# Patient Record
Sex: Female | Born: 1940 | Race: Black or African American | Hispanic: No | Smoking: Never smoker
Health system: Southern US, Community
[De-identification: ages and names within clinical notes are randomized; demographics above are authoritative.]

## PROBLEM LIST (undated history)

## (undated) DIAGNOSIS — I9789 Other postprocedural complications and disorders of the circulatory system, not elsewhere classified: Secondary | ICD-10-CM

## (undated) DIAGNOSIS — E119 Type 2 diabetes mellitus without complications: Secondary | ICD-10-CM

## (undated) DIAGNOSIS — N183 Chronic kidney disease, stage 3 unspecified: Secondary | ICD-10-CM

## (undated) DIAGNOSIS — I350 Nonrheumatic aortic (valve) stenosis: Secondary | ICD-10-CM

## (undated) DIAGNOSIS — R001 Bradycardia, unspecified: Secondary | ICD-10-CM

## (undated) DIAGNOSIS — I4891 Unspecified atrial fibrillation: Secondary | ICD-10-CM

## (undated) DIAGNOSIS — I214 Non-ST elevation (NSTEMI) myocardial infarction: Secondary | ICD-10-CM

## (undated) DIAGNOSIS — I10 Essential (primary) hypertension: Secondary | ICD-10-CM

## (undated) DIAGNOSIS — I2510 Atherosclerotic heart disease of native coronary artery without angina pectoris: Secondary | ICD-10-CM

## (undated) DIAGNOSIS — R600 Localized edema: Secondary | ICD-10-CM

## (undated) DIAGNOSIS — E785 Hyperlipidemia, unspecified: Secondary | ICD-10-CM

## (undated) DIAGNOSIS — R002 Palpitations: Secondary | ICD-10-CM

## (undated) DIAGNOSIS — M1990 Unspecified osteoarthritis, unspecified site: Secondary | ICD-10-CM

## (undated) DIAGNOSIS — D509 Iron deficiency anemia, unspecified: Secondary | ICD-10-CM

## (undated) HISTORY — DX: Essential (primary) hypertension: I10

## (undated) HISTORY — DX: Unspecified osteoarthritis, unspecified site: M19.90

## (undated) HISTORY — DX: Atherosclerotic heart disease of native coronary artery without angina pectoris: I25.10

## (undated) HISTORY — DX: Other postprocedural complications and disorders of the circulatory system, not elsewhere classified: I97.89

## (undated) HISTORY — DX: Type 2 diabetes mellitus without complications: E11.9

## (undated) HISTORY — DX: Palpitations: R00.2

## (undated) HISTORY — DX: Hyperlipidemia, unspecified: E78.5

## (undated) HISTORY — DX: Chronic kidney disease, stage 3 (moderate): N18.3

## (undated) HISTORY — PX: HEMORROIDECTOMY: SUR656

## (undated) HISTORY — DX: Bradycardia, unspecified: R00.1

## (undated) HISTORY — DX: Nonrheumatic aortic (valve) stenosis: I35.0

## (undated) HISTORY — PX: TUBAL LIGATION: SHX77

## (undated) HISTORY — DX: Localized edema: R60.0

## (undated) HISTORY — DX: Other postprocedural complications and disorders of the circulatory system, not elsewhere classified: I48.91

## (undated) HISTORY — DX: Non-ST elevation (NSTEMI) myocardial infarction: I21.4

## (undated) HISTORY — DX: Iron deficiency anemia, unspecified: D50.9

## (undated) HISTORY — PX: TONSILLECTOMY: SUR1361

## (undated) HISTORY — DX: Chronic kidney disease, stage 3 unspecified: N18.30

## (undated) HISTORY — PX: ESOPHAGOGASTRODUODENOSCOPY: SHX1529

---

## 2001-05-13 ENCOUNTER — Ambulatory Visit (HOSPITAL_COMMUNITY): Admission: RE | Admit: 2001-05-13 | Discharge: 2001-05-13 | Payer: Self-pay | Admitting: Family Medicine

## 2001-05-13 ENCOUNTER — Encounter: Payer: Self-pay | Admitting: Family Medicine

## 2001-06-04 ENCOUNTER — Ambulatory Visit (HOSPITAL_COMMUNITY): Admission: RE | Admit: 2001-06-04 | Discharge: 2001-06-04 | Payer: Self-pay | Admitting: Family Medicine

## 2001-07-08 ENCOUNTER — Encounter: Admission: RE | Admit: 2001-07-08 | Discharge: 2001-10-06 | Payer: Self-pay | Admitting: Family Medicine

## 2001-12-30 ENCOUNTER — Encounter: Admission: RE | Admit: 2001-12-30 | Discharge: 2002-03-30 | Payer: Self-pay | Admitting: Family Medicine

## 2003-01-12 ENCOUNTER — Encounter: Payer: Self-pay | Admitting: Family Medicine

## 2003-01-12 ENCOUNTER — Ambulatory Visit (HOSPITAL_COMMUNITY): Admission: RE | Admit: 2003-01-12 | Discharge: 2003-01-12 | Payer: Self-pay | Admitting: Family Medicine

## 2003-12-25 HISTORY — PX: COLONOSCOPY W/ POLYPECTOMY: SHX1380

## 2004-04-13 ENCOUNTER — Ambulatory Visit (HOSPITAL_COMMUNITY): Admission: RE | Admit: 2004-04-13 | Discharge: 2004-04-13 | Payer: Self-pay | Admitting: General Surgery

## 2004-08-17 ENCOUNTER — Ambulatory Visit (HOSPITAL_COMMUNITY): Admission: RE | Admit: 2004-08-17 | Discharge: 2004-08-17 | Payer: Self-pay | Admitting: Family Medicine

## 2005-09-20 ENCOUNTER — Ambulatory Visit (HOSPITAL_COMMUNITY): Admission: RE | Admit: 2005-09-20 | Discharge: 2005-09-20 | Payer: Self-pay | Admitting: Family Medicine

## 2006-09-23 ENCOUNTER — Ambulatory Visit (HOSPITAL_COMMUNITY): Admission: RE | Admit: 2006-09-23 | Discharge: 2006-09-23 | Payer: Self-pay | Admitting: Internal Medicine

## 2007-10-02 ENCOUNTER — Ambulatory Visit: Payer: Self-pay | Admitting: Cardiovascular Disease

## 2007-10-03 ENCOUNTER — Ambulatory Visit (HOSPITAL_COMMUNITY): Admission: RE | Admit: 2007-10-03 | Discharge: 2007-10-03 | Payer: Self-pay | Admitting: Internal Medicine

## 2007-10-07 ENCOUNTER — Ambulatory Visit: Payer: Self-pay | Admitting: Cardiology

## 2007-10-07 ENCOUNTER — Ambulatory Visit (HOSPITAL_COMMUNITY): Admission: RE | Admit: 2007-10-07 | Discharge: 2007-10-07 | Payer: Self-pay | Admitting: Cardiovascular Disease

## 2007-10-07 ENCOUNTER — Encounter (HOSPITAL_COMMUNITY): Admission: RE | Admit: 2007-10-07 | Discharge: 2007-11-06 | Payer: Self-pay | Admitting: Internal Medicine

## 2007-10-19 ENCOUNTER — Emergency Department (HOSPITAL_COMMUNITY): Admission: EM | Admit: 2007-10-19 | Discharge: 2007-10-19 | Payer: Self-pay | Admitting: Emergency Medicine

## 2007-10-20 ENCOUNTER — Emergency Department (HOSPITAL_COMMUNITY): Admission: EM | Admit: 2007-10-20 | Discharge: 2007-10-20 | Payer: Self-pay | Admitting: Emergency Medicine

## 2007-10-21 ENCOUNTER — Ambulatory Visit (HOSPITAL_COMMUNITY): Admission: RE | Admit: 2007-10-21 | Discharge: 2007-10-21 | Payer: Self-pay | Admitting: Emergency Medicine

## 2008-10-05 ENCOUNTER — Ambulatory Visit (HOSPITAL_COMMUNITY): Admission: RE | Admit: 2008-10-05 | Discharge: 2008-10-05 | Payer: Self-pay | Admitting: Internal Medicine

## 2008-10-08 ENCOUNTER — Ambulatory Visit: Payer: Self-pay | Admitting: Cardiology

## 2008-10-11 ENCOUNTER — Ambulatory Visit (HOSPITAL_COMMUNITY): Admission: RE | Admit: 2008-10-11 | Discharge: 2008-10-11 | Payer: Self-pay | Admitting: Internal Medicine

## 2009-01-10 ENCOUNTER — Emergency Department (HOSPITAL_COMMUNITY): Admission: EM | Admit: 2009-01-10 | Discharge: 2009-01-11 | Payer: Self-pay | Admitting: Emergency Medicine

## 2009-11-09 ENCOUNTER — Ambulatory Visit (HOSPITAL_COMMUNITY): Admission: RE | Admit: 2009-11-09 | Discharge: 2009-11-09 | Payer: Self-pay | Admitting: Family Medicine

## 2010-11-23 ENCOUNTER — Ambulatory Visit (HOSPITAL_COMMUNITY)
Admission: RE | Admit: 2010-11-23 | Discharge: 2010-11-23 | Payer: Self-pay | Source: Home / Self Care | Admitting: Internal Medicine

## 2011-04-09 LAB — BASIC METABOLIC PANEL
Chloride: 107 mEq/L (ref 96–112)
GFR calc Af Amer: >60 mL/min (ref >60)
Potassium: 3.6 mEq/L (ref 3.5–5.1)
Sodium: 142 mEq/L (ref 135–145)

## 2011-04-09 LAB — CBC
Hemoglobin: 12.4 g/dL (ref 12.0–15.0)
MCHC: 31.5 g/dL (ref 30.0–36.0)
RBC: 5.14 MIL/uL — ABNORMAL HIGH (ref 3.87–5.11)
RDW: 14.3 % (ref 11.5–15.5)
WBC: 7.8 10*3/uL (ref 4.0–10.5)

## 2011-04-09 LAB — DIFFERENTIAL
Basophils Relative: 0 % (ref 0–1)
Eosinophils Absolute: 0 10*3/uL (ref 0.0–0.7)
Monocytes Absolute: 0.5 10*3/uL (ref 0.1–1.0)
Neutrophils Relative %: 67 % (ref 43–77)

## 2011-05-08 NOTE — Procedures (Signed)
NAMEJOVANA, REMBOLD               ACCOUNT NO.:  [id number]   MEDICAL RECORD NO.:  [id number]          PATIENT TYPE:  OUT   LOCATION:  RAD                           FACILITY:  [facility]   PHYSICIAN:  Gerrit Friends. Dietrich Pates, MD, FACCDATE OF BIRTH:  09/19/1941   DATE OF PROCEDURE:  DATE OF DISCHARGE:  10/07/2007                                ECHOCARDIOGRAM   CLINICAL DATA:  A 70 year old woman with murmur and hypertension.  M-  mode aorta 2.9, left atrium 4.0, septum 1.4, posterior wall 1.1, LV  diastole 3.5, LV systole 2.8.   1. Technically adequate echocardiographic study.  2. Very mild left atrial enlargement; normal right atrium and right      ventricle.  3. Normal proximal ascending aorta with mild calcification of the wall      and annulus.  4. Mild sclerosis of the aortic valve.  5. Normal mitral valve; mild annular calcification; trace      regurgitation.  6. Normal tricuspid valve with physiologic regurgitation and normal      estimated RV systolic pressure.  7. Normal pulmonic valve and proximal pulmonary artery.  8. Normal left ventricular size; mild hypertrophy; normal regional and      global function.  9. Normal IVC.      Gerrit Friends. Dietrich Pates, MD, [facility]  Electronically Signed     RMR/MEDQ  D:  10/08/2007  T:  10/09/2007  Job:  [id number]

## 2011-05-08 NOTE — Assessment & Plan Note (Signed)
[facility]                       [place] CARDIOLOGY OFFICE NOTE   NAME:Quijas, CAMIA DIPINTO                      MRN:          [id number]  DATE:10/02/2007                            DOB:          1941/04/29    Ms. Lemaster is a 70 year old patient referred by Dr. Margo Aye for multiple  coronary risk factors and a murmur.   The patient has hypertension, hypercholesterolemia.  She is a non  smoker.  She is a recent diabetic diagnosed in November.  Negative  family history.  She has not had a recent stress-test, apparently she has had a Holter  monitor over the past for palpitations. Per the patient she currently  denies cardiac symptoms.  She is anxious about the palpitations.  She  gets her around fairly well and does not complain of exertion or  dyspnea. She has not had significant chest pain.   The patient has been compliant with her medications.   In talking to the patient she does seem a little slow to respond and  seems a little bit forgetful.   She has had the history of a murmur, this has been somewhat ill-defined.  Unfortunately her chart is not here and I do not have any old records on  her.   The patient has not had poor dentition. She has not had rheumatic heart  disease or previous endocarditis.   She clearly has not had any syncope, chest pain, or arrhythmias that  would indicate severe underlying valvular heart disease.   The patient lives with her son however, she gets around and does all her  activities of daily living and drives. There have been no physical  limitations based on fatigue or dyspnea.  Her review of systems is  otherwise negative.   MEDICATIONS:  1. Carvedilol 3.125 b.i.d.  2. Lasix 20 daily.  3. Simvastatin 40 daily.  4. Cardizem 240 daily.  5. Cozaar 100 daily.  6. Aspirin daily.  7. Metformin 500 b.i.d.  8. Potassium 10 daily.   ALLERGIES:  She denies any allergies.   FAMILY HISTORY:  Remarkable for her  father having died in a car wreck  and her mother died at age 70 of old age.   PAST MEDICAL HISTORY:  1. Remarkable for diabetes diagnosed in November, 2007.  2. Previous tonsillectomy at age 83.  3. Previous tubal ligation in 40.  4. Previous hemorrhoid surgery in 1973.  5. Glaucoma with laser therapy in both eyes but more on the right.   The patient is widowed for over 30 years.  She lives with her son. 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  She does not drink or smoke.   EXAMINATION:  GENERAL:  Exam is remarkable for an elderly black female  in no distress.  HEENT:  Her eyes are somewhat prominent.  Normal, no bruits, no  thyromegaly, no lymphadenopathy, no JVP elevation.  VITAL SIGNS:  Her blood pressure is 140/68, pulse 60 and regular,  respiratory rate is 14. She is afebrile. Weight is 159.  LUNGS:  Clear, good diaphragmatic motion, no wheezing.  HEART:  S1, S2, second heart sound is preserved. She has an aortic  sclerosis murmur, PMI is normal.  ABDOMEN:  Benign, bowel sounds positive.  No abdominal aortic aneurysm,  no tenderness, no hepatosplenomegaly or hepatojugular reflux. Femoral's  are +4 bilaterally.  EXTREMITIES:  Posterior tibial pulses +3, there is no lower extremity  edema.  NEURO:  Nonfocal.  There is no focal muscular weakness.  SKIN:  Warm and dry.   IMPRESSION:  1. 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  2. Hypertension - currently well controlled.  Continue current      medications.  I will try to get a copy of an EKG from Dr. Scharlene Gloss      office to see what her baseline is in regards to LVH and left      atrial enlargement but we will get more information about this from      her echo.  3. Hypercholesterolemia - follow  up with Dr. Margo Aye. Lipid liver profile      q.6 months. Primary prevention at this point.  4. Diabetes, recent onset - continue Metformin.  A hemoglobin A1C      quarterly.   As long as the patient's echocardiogram does not show significant  valvular heart disease she will follow up with Dr. Catalina Pizza for risk  factor modification.   Given her multiple risk factors including new onset diabetes and her  age, I think it is reasonable for the patient to have a stress-Myoview  to rule out occult coronary artery disease.  Will also arrange this at  the time of her echo.     Noralyn Pick. Eden Emms, MD, [facility]  Electronically Signed    PCN/MedQ  DD: 10/02/2007  DT: 10/02/2007  Job #: [id number]   cc:   Catalina Pizza, M.D.

## 2011-05-08 NOTE — Assessment & Plan Note (Signed)
[facility]                       [place] CARDIOLOGY OFFICE NOTE   NAME:Stephanie Hendricks, Stephanie Hendricks                      MRN:          [id number]  DATE:10/08/2008                            DOB:          07-22-1941    PRIMARY CARE PHYSICIAN:  Catalina Pizza, MD   REASON FOR VISIT:  Scheduled followup.   HISTORY OF PRESENT ILLNESS:  Stephanie Hendricks was seen last year by Dr. Eden Emms  in October.  She was referred at that time with a history of cardiac  murmur and also for risk stratification in light of cardiac risk factors  including hypertension, hyperlipidemia, and diabetes mellitus.  She was  referred for an echocardiogram which demonstrated mild sclerosis of the  aortic valve as well as mild calcification within the ascending aorta,  mild left ventricular hypertrophy with normal left ventricular systolic  function, and mild mitral calcification with trace regurgitation.  She  also underwent an exercise Myoview study which demonstrated no abnormal  electrocardiographic or perfusion changes with normal left ventricular  ejection fraction of 65%.  Since then, she denies having any problems  with angina or limiting breathlessness.  She has been doing some  exercises through her class in her church.  Main complaints have been of  some back arthritis and leg straining while doing toe touches.  She  reports compliance with her medications which have been adjusted since  we last saw her.  Her electrocardiogram today shows sinus bradycardia at  46 beats per minute with nonspecific T-wave flattening.   ALLERGIES:  No known drug allergies.   PRESENT MEDICATIONS:  1. Carvedilol 6.25 mg p.o. b.i.d.  2. Lasix 20 mg p.o. daily.  3. Simvastatin 40 mg p.o. daily.  4. Aspirin 81 mg p.o. daily.  5. Calcium and vitamin D.  6. Metformin 500 mg p.o. b.i.d.  7. Potassium 10 mEq p.o. daily.  8. Clonidine 0.1 mg p.o. b.i.d.   REVIEW OF SYSTEMS:  As described in the history of  present illness.  She  has trouble with gas.  She reports a hiatal hernia.  Otherwise, no  palpitations, orthopnea, PND, or syncope.  Otherwise, negative.   PHYSICAL EXAMINATION:  VITAL SIGNS:  Blood pressure is 130/70, heart  rate is 46, weight is 171 pounds, this is up from 159 at her last visit.  GENERAL:  She is comfortable and in no acute distress.  NECK:  No elevated jugular venous pressure.  No loud bruits.  No  thyromegaly is noted.  LUNGS:  Clear without labored breathing at rest.  CARDIAC:  Regular rate and rhythm with a 2/6 systolic murmur heard at  the base.  Second heart sound is preserved.  No S3, gallop, or  pericardial rub.  ABDOMEN:  Soft, nontender.  EXTREMITIES:  No frank pitting edema.   IMPRESSION AND RECOMMENDATIONS:  1. 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  She can be followed conservatively      at this point with physical exam.  Ejection fraction was normal,      and she had  no other major valvular abnormalities.  2. As far as cardiac risk stratification, she had a very reassuring      Myoview last year and is not reporting any new symptoms of chest      pain or limiting breathlessness.  At this point, we will anticipate      as-needed followup.  She can continue to see Dr. Margo Aye.  Her medical      regimen is reasonable.  She is bradycardic today, although is not      symptomatic.  This may require medication adjustments over time.      She is on both clonidine and carvedilol, which could impact heart      rate.     Jonelle Sidle, MD  Electronically Signed    SGM/MedQ  DD: 10/08/2008  DT: 10/09/2008  Job #: [id number]

## 2011-08-10 LAB — COMPREHENSIVE METABOLIC PANEL
Alkaline Phosphatase: 43 U/L
BUN: 22 mg/dL — AB (ref 4–21)
Creat: 1.36
Glucose: 104 mg/dL
Sodium: 143 mmol/L (ref 137–147)

## 2011-08-10 LAB — CBC WITH DIFFERENTIAL/PLATELET
Hemoglobin: 9.4 g/dL — AB (ref 12.0–16.0)
MCV: 77.7 fL
WBC: 5
platelet count: 229

## 2011-08-14 ENCOUNTER — Telehealth: Payer: Self-pay

## 2011-08-14 NOTE — Telephone Encounter (Signed)
Pt had called and left her phone number to call for a screening colonoscopy referred by Dr. Dwana Melena. LMOM for a return call.

## 2011-08-16 NOTE — Telephone Encounter (Signed)
Pt said she had colonoscopy in 2005 by Dr. Katrinka Blazing and had polyps. OV appt made with LL on 08/21/2011 @ 9:00 AM.  Per Bonita Quin in MR at [facility], could not find anything on pt.

## 2011-08-21 ENCOUNTER — Ambulatory Visit (INDEPENDENT_AMBULATORY_CARE_PROVIDER_SITE_OTHER): Payer: Medicare Other | Admitting: Gastroenterology

## 2011-08-21 ENCOUNTER — Encounter: Payer: Self-pay | Admitting: Gastroenterology

## 2011-08-21 VITALS — BP 135/69 | HR 53 | Temp 97.6°F | Ht 63.0 in | Wt <= 1120 oz

## 2011-08-21 DIAGNOSIS — R609 Edema, unspecified: Secondary | ICD-10-CM

## 2011-08-21 DIAGNOSIS — Z86010 Personal history of colonic polyps: Secondary | ICD-10-CM

## 2011-08-21 DIAGNOSIS — R600 Localized edema: Secondary | ICD-10-CM

## 2011-08-21 DIAGNOSIS — D509 Iron deficiency anemia, unspecified: Secondary | ICD-10-CM | POA: Insufficient documentation

## 2011-08-21 NOTE — Assessment & Plan Note (Signed)
Microcytic anemia. Hemoccult status unknown. Check iFOBT. Plan for colonoscopy with possible EGD in the near future.  I have discussed the risks, alternatives, benefits with regards to but not limited to the risk of reaction to medication, bleeding, infection, perforation and the patient is agreeable to proceed. Written consent to be obtained.

## 2011-08-21 NOTE — Progress Notes (Signed)
Primary Care Physician:  Dwana Melena, MD  Primary Gastroenterologist:  Roetta Sessions, MD   Chief Complaint  Patient presents with  . Colonoscopy    HPI:  Stephanie Hendricks is a 70 y.o. female here to schedule colonoscopy. She states her last colonoscopy was by Dr. Elpidio Anis in 2005. She says she has a history of polyps. She presents with microcytic anemia. Hemoccult status unknown. Appetite good. Sometimes bowel movements soft if eat cereal. No constipation. No melena, brbpr. No heartburn. No n/v. No dysphagia. No abdominal pain. Lots of gas. C/O chronic lower extremity bleeding for over one year. Left lower extremity more swollen than the right lower extremity. Denies vaginal bleeding or nosebleeds. Denies previous hysterectomy.  Current Outpatient Prescriptions  Medication Sig Dispense Refill  . aspirin 81 MG tablet Take 81 mg by mouth daily.        Marland Kitchen BYSTOLIC 10 MG tablet Take 10 mg by mouth daily.       . cloNIDine (CATAPRES) 0.2 MG tablet Take 0.2 mg by mouth 2 (two) times daily.       Marland Kitchen EXFORGE HCT 10-320-25 MG TABS       . furosemide (LASIX) 20 MG tablet Take 20 mg by mouth daily.       . metFORMIN (GLUCOPHAGE) 1000 MG tablet Take 1,000 mg by mouth 2 (two) times daily with a meal.       . Multiple Vitamin (MULTIVITAMIN) capsule Take 1 capsule by mouth daily.        . potassium chloride SA (K-DUR,KLOR-CON) 20 MEQ tablet Take 20 mEq by mouth daily.       . simvastatin (ZOCOR) 20 MG tablet Take 20 mg by mouth at bedtime.         Allergies as of 08/21/2011  . (No Known Allergies)    Past Medical History  Diagnosis Date  . HTN (hypertension)   . Hyperlipidemia   . DM (diabetes mellitus)   . Microcytic anemia   . Lower extremity edema     Past Surgical History  Procedure Date  . Tubal ligation   . Hemorroidectomy   . Tonsillectomy   . Colonoscopy 2005    Dr. Katrinka Blazing, polyps per patient  . Esophagogastroduodenoscopy     remote, swallowed chicken bone    Family History    Problem Relation Age of Onset  . Colon cancer Neg Hx   . Coronary artery disease Sister   . Heart attack Brother     deceased  . Liver disease Neg Hx     History   Social History  . Marital Status: Widowed    Spouse Name: N/A    Number of Children: 2  . Years of Education: N/A   Occupational History  . Not on file.   Social History Main Topics  . Smoking status: Never Smoker   . Smokeless tobacco: Not on file  . Alcohol Use: No  . Drug Use: No  . Sexually Active: Not on file   Other Topics Concern  . Not on file   Social History Narrative  . No narrative on file      ROS:  General: Negative for anorexia, weight loss, fever, chills. Complains of fatigue, weakness. Eyes: Negative for vision changes.  ENT: Negative for hoarseness, difficulty swallowing , nasal congestion. CV: Negative for chest pain, angina, palpitations, dyspnea on exertion.complains of chronic peripheral edema.  Respiratory: Negative for dyspnea at rest, dyspnea on exertion, cough, sputum, wheezing.  GI: See history of present  illness. GU:  Negative for dysuria, hematuria, urinary incontinence, urinary frequency, nocturnal urination.  MS: Negative for joint pain, low back pain.  Derm: Negative for rash or itching.  Neuro: Negative for weakness, abnormal sensation, seizure, frequent headaches, memory loss, confusion.  Psych: Negative for anxiety, depression, suicidal ideation, hallucinations.  Endo: Negative for unusual weight change.  Heme: Negative for bruising or bleeding. Allergy: Negative for rash or hives.    Physical Examination:  BP 135/69  Pulse 53  Temp(Src) 97.6 F (36.4 C) (Temporal)  Ht 5\' 3"  (1.6 m)  Wt 16 lb 3.2 oz (7.348 kg)  BMI 2.87 kg/m2   General: Well-nourished, well-developed in no acute distress.  Head: Normocephalic, atraumatic.   Eyes: Conjunctiva pink, no icterus. Mouth: Oropharyngeal mucosa moist and pink , no lesions erythema or exudate. Neck: Supple  without thyromegaly, masses, or lymphadenopathy.  Lungs: Clear to auscultation bilaterally.  Heart: Regular rate and rhythm, no murmurs rubs or gallops.  Abdomen: Bowel sounds are normal, nontender, nondistended, no hepatosplenomegaly or masses, no abdominal bruits or    hernia , no rebound or guarding.  Vertical incision below the umbilicus, patient denies hysterectomy. Rectal: Deferred to time of colonoscopy. Extremities: Lower extremity edema bilaterally. Left lower extremity with 2-3+ pitting edema, right lower extremity a 1-2+ pitting edema. No clubbing or deformities.  Neuro: Alert and oriented x 4 , grossly normal neurologically.  Skin: Warm and dry, no rash or jaundice.   Psych: Alert and cooperative, normal mood and affect.  Labs: Labs from 08/10/2011 Sodium 143, potassium 3.9, BUN 22, creatinine 1.36, total bilirubin 0.5, alkaline phosphatase 43, AST 16, ALT 14, albumin 4.1, white blood cell count 5000, hemoglobin 9.4, hematocrit 31, MCV 77.7, platelets 229,000, hemoglobin A1c 6.6.

## 2011-08-21 NOTE — Assessment & Plan Note (Signed)
Complains of chronic lower extremity edema. She could not get her regular shoes on today. Left lower extremity obviously more swollen than the right. Rule out DVT.

## 2011-08-21 NOTE — Assessment & Plan Note (Signed)
Colonoscopy planned.  

## 2011-08-21 NOTE — Progress Notes (Signed)
Cc to PCP 

## 2011-08-22 ENCOUNTER — Ambulatory Visit (HOSPITAL_COMMUNITY)
Admission: RE | Admit: 2011-08-22 | Discharge: 2011-08-22 | Disposition: A | Discharge status: Home / Self Care | Payer: Medicare Other | Source: Ambulatory Visit | Attending: Gastroenterology | Admitting: Gastroenterology

## 2011-08-22 DIAGNOSIS — R600 Localized edema: Secondary | ICD-10-CM

## 2011-08-22 DIAGNOSIS — M7989 Other specified soft tissue disorders: Secondary | ICD-10-CM | POA: Insufficient documentation

## 2011-08-22 DIAGNOSIS — I8000 Phlebitis and thrombophlebitis of superficial vessels of unspecified lower extremity: Secondary | ICD-10-CM | POA: Insufficient documentation

## 2011-08-23 ENCOUNTER — Ambulatory Visit (INDEPENDENT_AMBULATORY_CARE_PROVIDER_SITE_OTHER): Payer: Medicare Other | Admitting: Gastroenterology

## 2011-08-23 DIAGNOSIS — D649 Anemia, unspecified: Secondary | ICD-10-CM

## 2011-08-23 NOTE — Progress Notes (Signed)
Quick Note:  No DVT. Superficial thrombophlebitis age indeterminate. Needs to f/u with PCP. Send copy of report. ______

## 2011-08-23 NOTE — Progress Notes (Signed)
Quick Note:  Addressed at OV. TCS+/-EGD planned. ______

## 2011-08-23 NOTE — Progress Notes (Signed)
Quick Note:  Noted. IDA. TCS+/-EGD as planned. ______

## 2011-08-24 NOTE — Progress Notes (Signed)
Quick Note:  Tried to call pt- LMOM ______ 

## 2011-08-28 ENCOUNTER — Ambulatory Visit (HOSPITAL_COMMUNITY)
Admission: RE | Admit: 2011-08-28 | Discharge: 2011-08-28 | Disposition: A | Discharge status: Home / Self Care | Payer: Medicare Other | Source: Ambulatory Visit | Attending: Internal Medicine | Admitting: Internal Medicine

## 2011-08-28 ENCOUNTER — Encounter (HOSPITAL_COMMUNITY)
Admission: RE | Disposition: A | Discharge status: Home / Self Care | Payer: Self-pay | Source: Ambulatory Visit | Attending: Internal Medicine

## 2011-08-28 ENCOUNTER — Other Ambulatory Visit: Payer: Self-pay | Admitting: Internal Medicine

## 2011-08-28 ENCOUNTER — Encounter (HOSPITAL_COMMUNITY): Payer: Self-pay | Admitting: *Deleted

## 2011-08-28 DIAGNOSIS — Z860100 Personal history of colon polyps, unspecified: Secondary | ICD-10-CM | POA: Insufficient documentation

## 2011-08-28 DIAGNOSIS — D509 Iron deficiency anemia, unspecified: Secondary | ICD-10-CM | POA: Insufficient documentation

## 2011-08-28 DIAGNOSIS — I10 Essential (primary) hypertension: Secondary | ICD-10-CM | POA: Insufficient documentation

## 2011-08-28 DIAGNOSIS — Z01812 Encounter for preprocedural laboratory examination: Secondary | ICD-10-CM | POA: Insufficient documentation

## 2011-08-28 DIAGNOSIS — Z79899 Other long term (current) drug therapy: Secondary | ICD-10-CM | POA: Insufficient documentation

## 2011-08-28 DIAGNOSIS — K2960 Other gastritis without bleeding: Secondary | ICD-10-CM

## 2011-08-28 DIAGNOSIS — E785 Hyperlipidemia, unspecified: Secondary | ICD-10-CM | POA: Insufficient documentation

## 2011-08-28 DIAGNOSIS — D126 Benign neoplasm of colon, unspecified: Secondary | ICD-10-CM

## 2011-08-28 HISTORY — PX: ESOPHAGOGASTRODUODENOSCOPY: SHX5428

## 2011-08-28 HISTORY — PX: COLONOSCOPY: SHX5424

## 2011-08-28 LAB — GLUCOSE, CAPILLARY: Glucose-Capillary: 90 mg/dL (ref 70–99)

## 2011-08-28 SURGERY — COLONOSCOPY
Anesthesia: Moderate Sedation

## 2011-08-28 MED ORDER — MEPERIDINE HCL 100 MG/ML IJ SOLN
INTRAMUSCULAR | Status: AC
  Filled 2011-08-28: qty 2

## 2011-08-28 MED ORDER — SODIUM CHLORIDE 0.45 % IV SOLN
Freq: Once | INTRAVENOUS | Status: AC
  Administered 2011-08-28: 1000 mL via INTRAVENOUS

## 2011-08-28 MED ORDER — MIDAZOLAM HCL 5 MG/5ML IJ SOLN
INTRAMUSCULAR | Status: DC | PRN
  Administered 2011-08-28 (×2): 2 mg via INTRAVENOUS
  Administered 2011-08-28: 1 mg via INTRAVENOUS

## 2011-08-28 MED ORDER — MEPERIDINE HCL 100 MG/ML IJ SOLN
INTRAMUSCULAR | Status: DC | PRN
  Administered 2011-08-28 (×2): 25 mg via INTRAVENOUS
  Administered 2011-08-28: 50 mg via INTRAVENOUS

## 2011-08-28 MED ORDER — BUTAMBEN-TETRACAINE-BENZOCAINE 2-2-14 % EX AERO
INHALATION_SPRAY | CUTANEOUS | Status: DC | PRN
  Administered 2011-08-28: 2 via TOPICAL

## 2011-08-28 MED ORDER — MIDAZOLAM HCL 5 MG/5ML IJ SOLN
INTRAMUSCULAR | Status: AC
  Filled 2011-08-28: qty 10

## 2011-08-28 MED ORDER — STERILE WATER FOR IRRIGATION IR SOLN
Status: DC | PRN
  Administered 2011-08-28: 10:00:00

## 2011-08-28 NOTE — H&P (Addendum)
Stephanie Coast, PA  08/21/2011 10:32 AM  Signed Primary Care Physician:  Dwana Melena, MD   Primary Gastroenterologist:  Roetta Sessions, MD      Chief Complaint   Patient presents with   .  Colonoscopy      HPI:  Stephanie Hendricks is a 70 y.o. female here to schedule colonoscopy. She states her last colonoscopy was by Dr. Elpidio Anis in 2005. She says she has a history of polyps. She presents with microcytic anemia. Hemoccult status unknown. Appetite good. Sometimes bowel movements soft if eat cereal. No constipation. No melena, brbpr. No heartburn. No n/v. No dysphagia. No abdominal pain. Lots of gas. C/O chronic lower extremity bleeding for over one year. Left lower extremity more swollen than the right lower extremity. Denies vaginal bleeding or nosebleeds. Denies previous hysterectomy.    Current Outpatient Prescriptions   Medication  Sig  Dispense  Refill   .  aspirin 81 MG tablet  Take 81 mg by mouth daily.           Marland Kitchen  BYSTOLIC 10 MG tablet  Take 10 mg by mouth daily.          .  cloNIDine (CATAPRES) 0.2 MG tablet  Take 0.2 mg by mouth 2 (two) times daily.          Marland Kitchen  EXFORGE HCT 10-320-25 MG TABS           .  furosemide (LASIX) 20 MG tablet  Take 20 mg by mouth daily.          .  metFORMIN (GLUCOPHAGE) 1000 MG tablet  Take 1,000 mg by mouth 2 (two) times daily with a meal.          .  Multiple Vitamin (MULTIVITAMIN) capsule  Take 1 capsule by mouth daily.           .  potassium chloride SA (K-DUR,KLOR-CON) 20 MEQ tablet  Take 20 mEq by mouth daily.          .  simvastatin (ZOCOR) 20 MG tablet  Take 20 mg by mouth at bedtime.              Allergies as of 08/21/2011   .  (No Known Allergies)       Past Medical History   Diagnosis  Date   .  HTN (hypertension)     .  Hyperlipidemia     .  DM (diabetes mellitus)     .  Microcytic anemia     .  Lower extremity edema         Past Surgical History   Procedure  Date   .  Tubal ligation     .  Hemorroidectomy     .   Tonsillectomy     .  Colonoscopy  2005       Dr. Katrinka Blazing, polyps per patient   .  Esophagogastroduodenoscopy         remote, swallowed chicken bone       Family History   Problem  Relation  Age of Onset   .  Colon cancer  Neg Hx     .  Coronary artery disease  Sister     .  Heart attack  Brother         deceased   .  Liver disease  Neg Hx         History       Social History   .  Marital Status:  Widowed       Spouse Name:  N/A       Number of Children:  2   .  Years of Education:  N/A       Occupational History   .  Not on file.       Social History Main Topics   .  Smoking status:  Never Smoker    .  Smokeless tobacco:  Not on file   .  Alcohol Use:  No   .  Drug Use:  No   .  Sexually Active:  Not on file       Other Topics  Concern   .  Not on file       Social History Narrative   .  No narrative on file        ROS:   General: Negative for anorexia, weight loss, fever, chills. Complains of fatigue, weakness. Eyes: Negative for vision changes.   ENT: Negative for hoarseness, difficulty swallowing , nasal congestion. CV: Negative for chest pain, angina, palpitations, dyspnea on exertion.complains of chronic peripheral edema.   Respiratory: Negative for dyspnea at rest, dyspnea on exertion, cough, sputum, wheezing.   GI: See history of present illness. GU:  Negative for dysuria, hematuria, urinary incontinence, urinary frequency, nocturnal urination.   MS: Negative for joint pain, low back pain.   Derm: Negative for rash or itching.   Neuro: Negative for weakness, abnormal sensation, seizure, frequent headaches, memory loss, confusion.   Psych: Negative for anxiety, depression, suicidal ideation, hallucinations.   Endo: Negative for unusual weight change.   Heme: Negative for bruising or bleeding. Allergy: Negative for rash or hives.     Physical Examination:   BP 135/69  Pulse 53  Temp(Src) 97.6 F (36.4 C) (Temporal)  Ht 5\' 3"  (1.6 m)  Wt  16 lb 3.2 oz (7.348 kg)  BMI 2.87 kg/m2    General: Well-nourished, well-developed in no acute distress.   Head: Normocephalic, atraumatic.    Eyes: Conjunctiva pink, no icterus. Mouth: Oropharyngeal mucosa moist and pink , no lesions erythema or exudate. Neck: Supple without thyromegaly, masses, or lymphadenopathy.   Lungs: Clear to auscultation bilaterally.   Heart: Regular rate and rhythm, no murmurs rubs or gallops.   Abdomen: Bowel sounds are normal, nontender, nondistended, no hepatosplenomegaly or masses, no abdominal bruits or    hernia , no rebound or guarding.  Vertical incision below the umbilicus, patient denies hysterectomy. Rectal: Deferred to time of colonoscopy. Extremities: Lower extremity edema bilaterally. Left lower extremity with 2-3+ pitting edema, right lower extremity a 1-2+ pitting edema. No clubbing or deformities.   Neuro: Alert and oriented x 4 , grossly normal neurologically.   Skin: Warm and dry, no rash or jaundice.    Psych: Alert and cooperative, normal mood and affect.   Labs: Labs from 08/10/2011 Sodium 143, potassium 3.9, BUN 22, creatinine 1.36, total bilirubin 0.5, alkaline phosphatase 43, AST 16, ALT 14, albumin 4.1, white blood cell count 5000, hemoglobin 9.4, hematocrit 31, MCV 77.7, platelets 229,000, hemoglobin A1c 6.6.       [name]  08/21/2011 11:43 AM  Signed Cc to PCP        Microcytic anemia - Stephanie Coast, PA  08/21/2011 10:29 AM  Signed Microcytic anemia. Hemoccult status unknown. Check iFOBT. Plan for colonoscopy with possible EGD in the near future.  I have discussed the risks, alternatives, benefits with regards to but not limited to the risk of reaction to medication,  bleeding, infection, perforation and the patient is agreeable to proceed. Written consent to be obtained.     Hx of colonic polyps - Stephanie Coast, PA  08/21/2011 10:30 AM  Signed Colonoscopy planned.  Lower extremity edema - Stephanie Coast, PA  08/21/2011 10:31 AM   Signed Complains of chronic lower extremity edema. She could not get her regular shoes on today. Left lower extremity obviously more swollen than the right. Rule out DVT.   I have seen the patient prior to the procedure(s) today and reviewed the history and physical / consultation from 08/21/11.  There have been no changes. After consideration of the risks, benefits, alternatives and imponderables, the patient has consented to the procedure(s).  Venous Dopplers negative for DVT.

## 2011-08-28 NOTE — Progress Notes (Signed)
Results Cc to PCP  

## 2011-09-04 ENCOUNTER — Encounter (HOSPITAL_COMMUNITY): Payer: Self-pay | Admitting: Internal Medicine

## 2011-10-03 LAB — DIFFERENTIAL
Basophils Relative: 1
Eosinophils Absolute: 0.1
Eosinophils Relative: 1
Lymphocytes Relative: 34
Monocytes Relative: 5
Neutro Abs: 3.4

## 2011-10-03 LAB — COMPREHENSIVE METABOLIC PANEL
Albumin: 4.2
Alkaline Phosphatase: 56
BUN: 12
Potassium: 3.6
Total Protein: 8

## 2011-10-03 LAB — POCT CARDIAC MARKERS
CKMB, poc: 1.9
Myoglobin, poc: 114
Troponin i, poc: <0.05

## 2011-10-03 LAB — CBC
HCT: 38.6
Platelets: 229
RDW: 13.4

## 2011-10-22 ENCOUNTER — Encounter (INDEPENDENT_AMBULATORY_CARE_PROVIDER_SITE_OTHER): Payer: Medicare Other | Admitting: Ophthalmology

## 2011-10-22 DIAGNOSIS — H43819 Vitreous degeneration, unspecified eye: Secondary | ICD-10-CM

## 2011-10-22 DIAGNOSIS — H31009 Unspecified chorioretinal scars, unspecified eye: Secondary | ICD-10-CM

## 2011-10-22 DIAGNOSIS — E11319 Type 2 diabetes mellitus with unspecified diabetic retinopathy without macular edema: Secondary | ICD-10-CM

## 2011-10-22 DIAGNOSIS — H35039 Hypertensive retinopathy, unspecified eye: Secondary | ICD-10-CM

## 2011-11-13 LAB — COMPREHENSIVE METABOLIC PANEL
Albumin: 4.2
Calcium: 9.9 mg/dL
Glucose: 112 mg/dL
Sodium: 146 mmol/L (ref 137–147)

## 2011-11-13 LAB — IRON AND TIBC
Iron Saturation: 24
Iron: 73
TIBC: 307

## 2011-12-04 ENCOUNTER — Encounter: Payer: Self-pay | Admitting: Gastroenterology

## 2011-12-04 ENCOUNTER — Ambulatory Visit (INDEPENDENT_AMBULATORY_CARE_PROVIDER_SITE_OTHER): Payer: Medicare Other | Admitting: Gastroenterology

## 2011-12-04 VITALS — BP 130/65 | HR 48 | Temp 97.6°F | Ht 63.0 in | Wt 164.4 lb

## 2011-12-04 DIAGNOSIS — D509 Iron deficiency anemia, unspecified: Secondary | ICD-10-CM

## 2011-12-04 DIAGNOSIS — Z86010 Personal history of colonic polyps: Secondary | ICD-10-CM

## 2011-12-04 DIAGNOSIS — R140 Abdominal distension (gaseous): Secondary | ICD-10-CM | POA: Insufficient documentation

## 2011-12-04 DIAGNOSIS — R141 Gas pain: Secondary | ICD-10-CM

## 2011-12-04 DIAGNOSIS — R143 Flatulence: Secondary | ICD-10-CM

## 2011-12-04 MED ORDER — RESTORA PO CAPS: 1.0000 | ORAL_CAPSULE | Freq: Every day | ORAL | Status: AC

## 2011-12-04 MED ORDER — RESTORA PO CAPS: 1.0000 | ORAL_CAPSULE | Freq: Every day | ORAL | Status: DC

## 2011-12-04 NOTE — Assessment & Plan Note (Signed)
Next colonoscopy in September 2017.

## 2011-12-04 NOTE — Progress Notes (Signed)
Primary Care Physician: Dwana Melena, MD  Primary Gastroenterologist:  Roetta Sessions, MD   Chief Complaint  Patient presents with  . Follow-up    HPI: Stephanie Hendricks is a 70 y.o. female here for followup of microcytic anemia. She had an EGD and colonoscopy in September 2012 for her microcytic anemia. iFOBT was negative. She had a Cameron lesion, antral and prepyloric erosions with biopsy consistent with reactive gastropathy, small bowel biopsy negative for celiac disease. Biopsies made for H. pylori serologies. 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 She will need a followup colonoscopy in September 2017. Overall she feels well. He complains of gas and bloating. She has been using Beano with some relief. BM twice daily. No melena, brbpr. No abdominal pain, heartburn, dysphagia, vomiting.  No iron supplements. Had lab work last week.  Current Outpatient Prescriptions  Medication Sig Dispense Refill  . aspirin 81 MG tablet Take 81 mg by mouth daily.        Marland Kitchen BYSTOLIC 10 MG tablet Take 10 mg by mouth daily. 1/2 tablet a day       . cloNIDine (CATAPRES) 0.2 MG tablet Take 0.2 mg by mouth 2 (two) times daily.       Marland Kitchen EXFORGE HCT 10-320-25 MG TABS Take 25 mg by mouth daily.       . furosemide (LASIX) 20 MG tablet Take 20 mg by mouth daily.       . metFORMIN (GLUCOPHAGE) 1000 MG tablet Take 1,000 mg by mouth 2 (two) times daily with a meal.       . Multiple Vitamin (MULTIVITAMIN) capsule Take 1 capsule by mouth daily.        . potassium chloride SA (K-DUR,KLOR-CON) 20 MEQ tablet Take 20 mEq by mouth daily.       . simvastatin (ZOCOR) 20 MG tablet Take 20 mg by mouth at bedtime.         Allergies as of 12/04/2011  . (No Known Allergies)    ROS:  General: Negative for anorexia, weight loss, fever, chills, fatigue, weakness. ENT: Negative for hoarseness, difficulty swallowing , nasal congestion. CV: Negative for chest pain, angina, palpitations, dyspnea on exertion. Complains  of persistent peripheral edema but states it is improved.  Respiratory: Negative for dyspnea at rest, dyspnea on exertion, cough, sputum, wheezing.  GI: See history of present illness. GU:  Negative for dysuria, hematuria, urinary incontinence, urinary frequency, nocturnal urination.  Endo: Negative for unusual weight change. Weight documented in August at time of office visit clearly an error (16 pounds).   Physical Examination:   BP 130/65  Pulse 48  Temp(Src) 97.6 F (36.4 C) (Temporal)  Ht 5\' 3"  (1.6 m)  Wt 164 lb 6.4 oz (74.571 kg)  BMI 29.12 kg/m2  General: Well-nourished, well-developed in no acute distress.  Eyes: No icterus. Mouth: Oropharyngeal mucosa moist and pink , no lesions erythema or exudate. Lungs: Clear to auscultation bilaterally.  Heart: Regular rate and rhythm, no murmurs rubs or gallops.  Abdomen: Bowel sounds are normal, nontender, nondistended, no hepatosplenomegaly or masses, no abdominal bruits or hernia , no rebound or guarding.   Extremities: No lower extremity edema. No clubbing or deformities. Neuro: Alert and oriented x 4   Skin: Warm and dry, no jaundice.   Psych: Alert and cooperative, normal mood and affect.

## 2011-12-04 NOTE — Patient Instructions (Signed)
We will get a copy of your recent lab work from Dr. Margo Aye. We will let you know if you need any more lab work done.  Please start a probiotic, once daily. We have provided you with samples of Restora. You may take for four weeks and then stop. If it helps and your bloating/gas return, then you may by a probiotic over the counter and take daily as directed on the box. We recommend [employer] [facility] or Align.  Bloating Bloating is the feeling of fullness in your belly. You may feel as though your pants are too tight. Often the cause of bloating is overeating, retaining fluids, or having gas in your bowel. It is also caused by swallowing air and eating foods that cause gas. Irritable bowel syndrome is one of the most common causes of bloating. Constipation is also a common cause. Sometimes more serious problems can cause bloating. SYMPTOMS  Usually there is a feeling of fullness, as though your abdomen is bulged out. There may be mild discomfort.  DIAGNOSIS  Usually no particular testing is necessary for most bloating. If the condition persists and seems to become worse, your caregiver may do additional testing.  TREATMENT   There is no direct treatment for bloating.   Do not put gas into the bowel. Avoid chewing gum and sucking on candy. These tend to make you swallow air. Swallowing air can also be a nervous habit. Try to avoid this.   Avoiding high residue diets will help. Eat foods with soluble fibers (examples include root vegetables, apples, or barley) and substitute dairy products with soy and rice products. This helps irritable bowel syndrome.   If constipation is the cause, then a high residue diet with more fiber will help.   Avoid carbonated beverages.   Over-the-counter preparations are available that help reduce gas. Your pharmacist can help you with this.  SEEK MEDICAL CARE IF:   Bloating continues and seems to be getting worse.   You notice a weight gain.   You have a weight  loss but the bloating is getting worse.   You have changes in your bowel habits or develop nausea or vomiting.  SEEK IMMEDIATE MEDICAL CARE IF:   You develop shortness of breath or swelling in your legs.   You have an increase in abdominal pain or develop chest pain.  Document Released: 10/10/2006 Document Revised: 08/22/2011 Document Reviewed: 11/28/2007 [facility] Patient Information 2012 [place], [place].

## 2011-12-04 NOTE — Progress Notes (Signed)
Cc to PCP 

## 2011-12-04 NOTE — Assessment & Plan Note (Signed)
Gas/bloating handout provided. Trial of probiotics once daily for the next month. One week supply of Restora provided. She will complete course with [employer] [facility] or Align (OTC).

## 2011-12-04 NOTE — Assessment & Plan Note (Signed)
Will retrieve blood work done from Dr. Dwana Melena last week. Further recommendations to follow.

## 2011-12-06 ENCOUNTER — Other Ambulatory Visit (HOSPITAL_COMMUNITY): Payer: Self-pay | Admitting: Internal Medicine

## 2011-12-06 DIAGNOSIS — Z139 Encounter for screening, unspecified: Secondary | ICD-10-CM

## 2011-12-10 ENCOUNTER — Ambulatory Visit (HOSPITAL_COMMUNITY)
Admission: RE | Admit: 2011-12-10 | Discharge: 2011-12-10 | Disposition: A | Discharge status: Home / Self Care | Payer: Medicare Other | Source: Ambulatory Visit | Attending: Internal Medicine | Admitting: Internal Medicine

## 2011-12-10 DIAGNOSIS — Z1231 Encounter for screening mammogram for malignant neoplasm of breast: Secondary | ICD-10-CM | POA: Insufficient documentation

## 2011-12-10 DIAGNOSIS — Z139 Encounter for screening, unspecified: Secondary | ICD-10-CM

## 2011-12-27 NOTE — Progress Notes (Signed)
Quick Note:  Hgb somewhat better.  Recommend repeat ifobt. CBC in 3 months. ______

## 2011-12-31 ENCOUNTER — Other Ambulatory Visit: Payer: Self-pay | Admitting: Gastroenterology

## 2011-12-31 DIAGNOSIS — D649 Anemia, unspecified: Secondary | ICD-10-CM

## 2011-12-31 NOTE — Progress Notes (Signed)
Quick Note:  Pt aware, ifobt in mail to pt. Lab order on file. ______

## 2012-01-07 ENCOUNTER — Ambulatory Visit (INDEPENDENT_AMBULATORY_CARE_PROVIDER_SITE_OTHER): Payer: Medicare Other | Admitting: Gastroenterology

## 2012-01-07 DIAGNOSIS — D649 Anemia, unspecified: Secondary | ICD-10-CM

## 2012-01-07 LAB — IFOBT (OCCULT BLOOD): IFOBT: NEGATIVE

## 2012-01-10 NOTE — Progress Notes (Signed)
Quick Note:  Routing to Julie, RMR pt. ______ 

## 2012-01-10 NOTE — Progress Notes (Signed)
Quick Note:  ifobt negative. Repeat cbc as planned. ______

## 2012-03-25 ENCOUNTER — Other Ambulatory Visit: Payer: Self-pay | Admitting: Gastroenterology

## 2012-03-26 LAB — CBC WITH DIFFERENTIAL/PLATELET
Basophils Absolute: 0 10*3/uL (ref 0.0–0.1)
HCT: 32.7 % — ABNORMAL LOW (ref 36.0–46.0)
Hemoglobin: 10 g/dL — ABNORMAL LOW (ref 12.0–15.0)
Lymphocytes Relative: 45 % (ref 12–46)
Monocytes Absolute: 0.4 10*3/uL (ref 0.1–1.0)
Monocytes Relative: 7 % (ref 3–12)
Neutro Abs: 2.9 10*3/uL (ref 1.7–7.7)
Neutrophils Relative %: 45 % (ref 43–77)
RDW: 15.3 % (ref 11.5–15.5)
WBC: 6.4 10*3/uL (ref 4.0–10.5)

## 2012-03-26 NOTE — Progress Notes (Signed)
Quick Note:  Persistent microcytic anemia. Needs repeat iron and TIBC, ferritin. She will likely have to go back to lab. Needs heme stool X 3.  Once results are back, we will decide if needs SB capsule or hematology referral. ______

## 2012-03-27 ENCOUNTER — Other Ambulatory Visit: Payer: Self-pay | Admitting: Gastroenterology

## 2012-03-27 DIAGNOSIS — D649 Anemia, unspecified: Secondary | ICD-10-CM

## 2012-03-28 ENCOUNTER — Other Ambulatory Visit: Payer: Self-pay | Admitting: Gastroenterology

## 2012-03-28 LAB — IRON AND TIBC
Iron: 51 ug/dL (ref 42–145)
TIBC: 295 ug/dL (ref 250–470)
UIBC: 244 ug/dL (ref 125–400)

## 2012-04-02 NOTE — Progress Notes (Signed)
Quick Note:  Iron sat little low and low normal iron. Ferritin ok. Suspect anemia of chronic disease +/- evolving IDA. Await heme X 3 before further recommendations. ______

## 2012-04-03 NOTE — Progress Notes (Signed)
Quick Note:  Pt aware, she said she just finished heme cards today. Informed her that we would call her back with further recommendations. ______

## 2012-08-19 ENCOUNTER — Encounter: Payer: Self-pay | Admitting: Cardiology

## 2012-08-19 ENCOUNTER — Ambulatory Visit (INDEPENDENT_AMBULATORY_CARE_PROVIDER_SITE_OTHER): Payer: Medicare Other | Admitting: Cardiology

## 2012-08-19 ENCOUNTER — Encounter: Payer: Self-pay | Admitting: *Deleted

## 2012-08-19 VITALS — BP 108/60 | HR 44 | Ht 63.0 in | Wt 172.4 lb

## 2012-08-19 DIAGNOSIS — E119 Type 2 diabetes mellitus without complications: Secondary | ICD-10-CM

## 2012-08-19 DIAGNOSIS — E1121 Type 2 diabetes mellitus with diabetic nephropathy: Secondary | ICD-10-CM | POA: Insufficient documentation

## 2012-08-19 DIAGNOSIS — I498 Other specified cardiac arrhythmias: Secondary | ICD-10-CM

## 2012-08-19 DIAGNOSIS — E785 Hyperlipidemia, unspecified: Secondary | ICD-10-CM

## 2012-08-19 DIAGNOSIS — D509 Iron deficiency anemia, unspecified: Secondary | ICD-10-CM

## 2012-08-19 DIAGNOSIS — I10 Essential (primary) hypertension: Secondary | ICD-10-CM

## 2012-08-19 DIAGNOSIS — R001 Bradycardia, unspecified: Secondary | ICD-10-CM

## 2012-08-19 LAB — TSH: TSH: 2.828 u[IU]/mL (ref 0.350–4.500)

## 2012-08-19 MED ORDER — CLONIDINE HCL 0.3 MG/24HR TD PTWK: 1.0000 | MEDICATED_PATCH | TRANSDERMAL | Status: DC

## 2012-08-19 NOTE — Patient Instructions (Addendum)
Your physician recommends that you schedule a follow-up appointment in: 2 months with Dr Diona Browner  Your physician has recommended you make the following change in your medication:  1 - STOP Bystolic 2 - STOP clonidine by mouth 3 - START Clonidine patch 3 mg weekly  Your physician has requested that you regularly monitor and record your blood pressure readings at home. Please use the same machine at the same time of day to check your readings and record them to bring to your follow-up visit.  Your physician recommends that you return for lab work in: TODAY

## 2012-08-19 NOTE — Progress Notes (Signed)
Patient ID: Stephanie Hendricks, female   DOB: 1941/08/09, 71 y.o.   MRN: [id number]  HPI: Initial cardiology evaluation performed at the kind request of Dr. Catalina Pizza following a four-year hiatus since the patient's previous visit to this office.  She is a patient of Dr. Doreatha Martin McDowell's, but I had the pleasure of assessing her today.  She has had aortic sclerosis and persistent sinus bradycardia without apparent adverse effects.  Lifestyle is sedentary, but there has been no deterioration in her physical capacity.  She does all of her housework, generally without difficulty.  She experiences occasional "wooziness" unrelated to activity or body attitude, but denies syncope.  Current Outpatient Prescriptions on File Prior to Visit  Medication Sig Dispense Refill  . aspirin 81 MG tablet Take 81 mg by mouth daily.        Marland Kitchen BYSTOLIC 10 MG tablet Take 10 mg by mouth daily. 1/2 tablet a day       . cetirizine (ZYRTEC) 10 MG tablet Take 10 mg by mouth daily.      . cloNIDine (CATAPRES) 0.2 MG tablet Take 0.2 mg by mouth 2 (two) times daily.       Marland Kitchen EXFORGE HCT 10-320-25 MG TABS Take 25 mg by mouth daily.       . furosemide (LASIX) 20 MG tablet Take 20 mg by mouth daily.       . JENTADUETO 2.5-500 MG TABS Take 1 tablet by mouth 2 (two) times daily.       . Multiple Vitamin (MULTIVITAMIN) capsule Take 1 capsule by mouth daily.        . potassium chloride SA (K-DUR,KLOR-CON) 20 MEQ tablet Take 20 mEq by mouth daily.       . simvastatin (ZOCOR) 20 MG tablet Take 20 mg by mouth at bedtime.        No Known Allergies    Past Medical History  Diagnosis Date  . Hypertension   . Hyperlipidemia   . Diabetes mellitus type II   . Microcytic anemia   . Lower extremity edema   . Bradycardia   . Cardiac murmur   . Degenerative joint disease   . Palpitations     Past Surgical History  Procedure Date  . Tubal ligation   . Hemorroidectomy   . Tonsillectomy   . Colonoscopy w/ polypectomy 2005    Dr. Cora Daniels  resected per patient  . Esophagogastroduodenoscopy     remote, foreign body extraction  . Colonoscopy 08/28/2011    sigmoid colon serated adenoma, next colonoscopy 08/2016  . Esophagogastroduodenoscopy 08/28/2011    hiatal hernia, erosion on fold of diaphragmatic hiatus (cameron lesion), antral and prepylori erosions, SB bx negative, gastric bx showed reactive gastropathy but no H.Pylori    Family History  Problem Relation Age of Onset  . Colon cancer Neg Hx   . Coronary artery disease Sister   . Heart attack Brother     deceased  . Liver disease Neg Hx     History   Social History  . Marital Status: Widowed    Spouse Name: N/A    Number of Children: 2  . Years of Education: N/A   Occupational History  . Not on file.   Social History Main Topics  . Smoking status: Never Smoker   . Smokeless tobacco: Not on file  . Alcohol Use: No  . Drug Use: No  . Sexually Active: Not on file   Other Topics Concern  . Not on file  Social History Narrative  . No narrative on file    ROS: Chronic pedal edema, that has not been problematic; remote history of abnormal chest x-ray; colonoscopy in 2012; musculoskeletal symptoms in the right shoulder and right back.   All other systems reviewed and are negative.  PHYSICAL EXAM: BP 108/60  Pulse 44  Ht 5\' 3"  (1.6 m)  Wt 78.2 kg (172 lb 6.4 oz)  BMI 30.54 kg/m2  General-Well-developed; no acute distress Body Habitus-Moderately overweight HEENT-[place]/AT; PERRL; EOM intact; conjunctiva and lids nl; bilateral arcus; upper dentures Neck-No JVD; no carotid bruits Endocrine-No thyromegaly Lungs-Clear lung fields; resonant percussion; normal I-to-E ratio Cardiovascular- normal PMI; normal S1 and S2 with preserved aortic component; grade 2-3/6 systolic ejection murmur at the cardiac base Abdomen-BS normal; soft and non-tender without masses or organomegaly Musculoskeletal-No deformities, cyanosis or clubbing Neurologic-Nl cranial nerves; symmetric  strength and tone Skin- Warm, no significant lesions Extremities-Nl distal pulses; 1-2+ ankle edema  EKG:  Sinus bradycardia at a rate of 44 bpm; borderline low voltage; otherwise normal.   ASSESSMENT AND PLAN:  Winsted Bing, MD 08/19/2012 11:50 AM

## 2012-08-19 NOTE — Assessment & Plan Note (Signed)
Blood pressure has been good over the past 12 months.  The relative effects of discontinuing Bystolic but increasing clonidine and changing the route of delivery are uncertain.  Patient will collect additional blood pressure determinations at home and present these to Dr. Margo Aye and Dr. Diona Browner at upcoming visits.

## 2012-08-19 NOTE — Assessment & Plan Note (Signed)
Initial testing suggests anemia of chronic disease, perhaps related to chronic mild renal insufficiency.  Additional testing and assessment by a hematologist may be worthwhile.

## 2012-08-19 NOTE — Progress Notes (Deleted)
Name: Stephanie Hendricks    DOB: [birth year]  Age: 71 y.o.  MR#: [id number]       PCP:  Dwana Melena, MD      Insurance: @PAYORNAME @   CC:   No chief complaint on file.   VS BP 108/60  Pulse 44  Ht 5\' 3"  (1.6 m)  Wt 172 lb 6.4 oz (78.2 kg)  BMI 30.54 kg/m2  Weights Current Weight  08/19/12 172 lb 6.4 oz (78.2 kg)  12/04/11 164 lb 6.4 oz (74.571 kg)  08/28/11 163 lb (73.936 kg)    Blood Pressure  BP Readings from Last 3 Encounters:  08/19/12 108/60  12/04/11 130/65  08/28/11 145/61     Admit date:  (Not on file) Last encounter with RMR:  Visit date not found   Allergy No Known Allergies  Current Outpatient Prescriptions  Medication Sig Dispense Refill  . ACCU-CHEK AVIVA PLUS test strip 1 each by Other route as needed.       Marland Kitchen aspirin 81 MG tablet Take 81 mg by mouth daily.        Marland Kitchen BYSTOLIC 10 MG tablet Take 10 mg by mouth daily. 1/2 tablet a day       . cetirizine (ZYRTEC) 10 MG tablet Take 10 mg by mouth daily.      . Cholecalciferol (VITAMIN D) 400 UNITS capsule Take 400 Units by mouth daily.      . cloNIDine (CATAPRES) 0.2 MG tablet Take 0.2 mg by mouth 2 (two) times daily.       Marland Kitchen EXFORGE HCT 10-320-25 MG TABS Take 25 mg by mouth daily.       . furosemide (LASIX) 20 MG tablet Take 20 mg by mouth daily.       . JENTADUETO 2.5-500 MG TABS Take 1 tablet by mouth 2 (two) times daily.       . Multiple Vitamin (MULTIVITAMIN) capsule Take 1 capsule by mouth daily.        . potassium chloride SA (K-DUR,KLOR-CON) 20 MEQ tablet Take 20 mEq by mouth daily.       . simvastatin (ZOCOR) 20 MG tablet Take 20 mg by mouth at bedtime.         Discontinued Meds:    Medications Discontinued During This Encounter  Medication Reason  . metFORMIN (GLUCOPHAGE) 1000 MG tablet Error    Patient Active Problem List  Diagnosis  . Microcytic anemia  . Hx of colonic polyps  . Hyperlipidemia  . Hypertension    LABS No visits with results within 3 Month(s) from this visit. Latest known  visit with results is:  Orders Only on 03/28/2012  Component Date Value  . Iron 03/28/2012 51   . UIBC 03/28/2012 244   . TIBC 03/28/2012 295   . %SAT 03/28/2012 17*  . Ferritin 03/28/2012 126      Results for this Opt Visit:     Results for orders placed in visit on 03/28/12  IRON AND TIBC      Component Value Range   Iron 51  42 - 145 ug/dL   UIBC 409  811 - 914 ug/dL   TIBC 782  956 - 213 ug/dL   %SAT 17 (*) 20 - 55 %  FERRITIN      Component Value Range   Ferritin 126  10 - 291 ng/mL    EKG Orders placed in visit on 10/08/08  . CONVERTED CEMR EKG     Prior Assessment and Plan Problem List as of  08/19/2012            Cardiology Problems   Hyperlipidemia   Hypertension     Other   Microcytic anemia   Last Assessment & Plan Note   12/04/2011 Office Visit Signed 12/04/2011 11:03 AM by Tiffany Kocher, PA    Will retrieve blood work done from Dr. Dwana Melena last week. Further recommendations to follow.    Hx of colonic polyps   Last Assessment & Plan Note   12/04/2011 Office Visit Signed 12/04/2011 11:03 AM by Tiffany Kocher, PA    Next colonoscopy in September 2017.        Imaging: No results found.   FRS Calculation: Score not calculated. Missing: Total Cholesterol, HDL

## 2012-08-19 NOTE — Assessment & Plan Note (Signed)
Lipid profile was good, especially in the absence of known vascular disease, when last assessed earlier this month.

## 2012-08-19 NOTE — Assessment & Plan Note (Signed)
Sinus bradycardia appears to be asymptomatic.  Due to fairly profound slowing of heart rate, bystolic will be discontinued.  Clonidine could also be contributing to her bradycardia and is causing some dry mouth and perhaps sedation.  We will try a transdermal preparation for more efficacy and fewer adverse effects.

## 2012-08-26 ENCOUNTER — Encounter: Payer: Self-pay | Admitting: *Deleted

## 2012-10-21 ENCOUNTER — Encounter (INDEPENDENT_AMBULATORY_CARE_PROVIDER_SITE_OTHER): Payer: Medicare Other | Admitting: Ophthalmology

## 2012-10-21 ENCOUNTER — Ambulatory Visit: Payer: Medicare Other | Admitting: Cardiology

## 2012-10-21 DIAGNOSIS — E11319 Type 2 diabetes mellitus with unspecified diabetic retinopathy without macular edema: Secondary | ICD-10-CM

## 2012-10-21 DIAGNOSIS — H2510 Age-related nuclear cataract, unspecified eye: Secondary | ICD-10-CM

## 2012-10-21 DIAGNOSIS — H35039 Hypertensive retinopathy, unspecified eye: Secondary | ICD-10-CM

## 2012-10-21 DIAGNOSIS — H43819 Vitreous degeneration, unspecified eye: Secondary | ICD-10-CM

## 2012-10-21 DIAGNOSIS — E1165 Type 2 diabetes mellitus with hyperglycemia: Secondary | ICD-10-CM

## 2012-10-21 DIAGNOSIS — I10 Essential (primary) hypertension: Secondary | ICD-10-CM

## 2012-10-22 ENCOUNTER — Encounter: Payer: Self-pay | Admitting: Cardiology

## 2012-10-22 ENCOUNTER — Ambulatory Visit (INDEPENDENT_AMBULATORY_CARE_PROVIDER_SITE_OTHER): Payer: Medicare Other | Admitting: Cardiology

## 2012-10-22 VITALS — BP 117/83 | HR 78 | Ht 63.0 in | Wt 174.0 lb

## 2012-10-22 DIAGNOSIS — R001 Bradycardia, unspecified: Secondary | ICD-10-CM

## 2012-10-22 DIAGNOSIS — I359 Nonrheumatic aortic valve disorder, unspecified: Secondary | ICD-10-CM

## 2012-10-22 DIAGNOSIS — I10 Essential (primary) hypertension: Secondary | ICD-10-CM

## 2012-10-22 DIAGNOSIS — R011 Cardiac murmur, unspecified: Secondary | ICD-10-CM

## 2012-10-22 DIAGNOSIS — I498 Other specified cardiac arrhythmias: Secondary | ICD-10-CM

## 2012-10-22 NOTE — Assessment & Plan Note (Signed)
Heart rate normal today. No changes made.

## 2012-10-22 NOTE — Assessment & Plan Note (Signed)
Sclerotic aortic valve noted several years ago by echocardiogram. Cardiac murmur noted on examination consistent with this. Second heart sound is preserved. Would like a followup echocardiogram although unlikely to need a repeat study for another few years at least.

## 2012-10-22 NOTE — Patient Instructions (Signed)
Your physician recommends that you schedule a follow-up appointment in: 1 year  Your physician has requested that you have an echocardiogram. Echocardiography is a painless test that uses sound waves to create images of your heart. It provides your doctor with information about the size and shape of your heart and how well your heart's chambers and valves are working. This procedure takes approximately one hour. There are no restrictions for this procedure.    

## 2012-10-22 NOTE — Progress Notes (Signed)
Clinical Summary Ms. Michels is a 71 y.o.female presenting for a followup visit. Most recently, she saw Dr. Dietrich Pates in August. Medications were adjusted at that time.  She reports no chest pain or palpitations. Has NYHA class II dyspnea on exertion. Reports tolerating the current medical regimen, her blood pressure does seem to be better controlled. She did not bring any measurements for review today however.  Reviewed remote cardiac testing. She has not had a followup echocardiogram in several years - previously documented aortic sclerosis and cardiac murmur.  She states that she rides a stationary bicycle about 30 minutes once a week, otherwise does some walking, no more than 1/2 mile at a time.   No Known Allergies  Current Outpatient Prescriptions  Medication Sig Dispense Refill  . ACCU-CHEK AVIVA PLUS test strip 1 each by Other route as needed.       Marland Kitchen aspirin 81 MG tablet Take 81 mg by mouth daily.        . cetirizine (ZYRTEC) 10 MG tablet Take 10 mg by mouth daily.      . Cholecalciferol (VITAMIN D) 400 UNITS capsule Take 400 Units by mouth daily.      . cloNIDine (CATAPRES - DOSED IN MG/24 HR) 0.3 mg/24hr Place 1 patch (0.3 mg total) onto the skin once a week.  4 patch  11  . EXFORGE HCT 10-320-25 MG TABS       . furosemide (LASIX) 20 MG tablet Take 20 mg by mouth daily.       . JENTADUETO 2.5-500 MG TABS Take 1 tablet by mouth 2 (two) times daily.       . Multiple Vitamin (MULTIVITAMIN) capsule Take 1 capsule by mouth daily.        . potassium chloride SA (K-DUR,KLOR-CON) 20 MEQ tablet Take 20 mEq by mouth daily.       . simvastatin (ZOCOR) 20 MG tablet Take 20 mg by mouth at bedtime.         Past Medical History  Diagnosis Date  . Essential hypertension, benign   . Hyperlipidemia   . Diabetes mellitus type II     A1c of 7.2 in 07/2012  . Microcytic anemia   . Lower extremity edema   . Bradycardia   . Aortic valve sclerosis   . Degenerative joint disease   .  Palpitations   . Chronic kidney disease, stage III (moderate)     Creatinine of 1.49 in 07/2012    Social History Ms. Huffaker reports that she has never smoked. She does not have any smokeless tobacco history on file. Ms. Edgren reports that she does not drink alcohol.  Review of Systems No reported bleeding episodes. No cough, fevers or chills. No orthopnea or PND. Mild leg edema. Otherwise negative.  Physical Examination Filed Vitals:   10/22/12 1255  BP: 117/83  Pulse: 78   Filed Weights   10/22/12 1255  Weight: 174 lb 0.6 oz (78.944 kg)   Patient in no acute distress. HEENT: Conjunctiva and lids normal, oropharynx clear. Neck: Supple, increased girth, no carotid bruits. Lungs: Clear to auscultation, nonlabored breathing at rest. Cardiac: Regular rate and rhythm, no S3, 2/6 systolic murmur, no pericardial rub. Abdomen: Soft, nontender, bowel sounds present. Extremities: Trace edema, distal pulses 2+. Skin: Warm and dry. Musculoskeletal: No kyphosis. Neuropsychiatric: Alert and oriented x3, affect grossly appropriate.   Problem List and Plan   Bradycardia Heart rate normal today. No changes made.  Aortic valve disorders Sclerotic aortic valve noted  several years ago by echocardiogram. Cardiac murmur noted on examination consistent with this. Second heart sound is preserved. Would like a followup echocardiogram although unlikely to need a repeat study for another few years at least.  Hypertension Blood pressure is normal today. No changes made to current regimen. Keep followup with Dr. Margo Aye. We will see her back in one year.    Jonelle Sidle, M.D., F.A.C.C.

## 2012-10-22 NOTE — Assessment & Plan Note (Signed)
Blood pressure is normal today. No changes made to current regimen. Keep followup with Dr. Margo Aye. We will see her back in one year.

## 2012-10-24 ENCOUNTER — Other Ambulatory Visit (HOSPITAL_COMMUNITY): Payer: Medicare Other

## 2012-10-24 ENCOUNTER — Ambulatory Visit (HOSPITAL_COMMUNITY)
Admission: RE | Admit: 2012-10-24 | Discharge: 2012-10-24 | Disposition: A | Discharge status: Home / Self Care | Payer: Medicare Other | Source: Ambulatory Visit | Attending: Cardiology | Admitting: Cardiology

## 2012-10-24 DIAGNOSIS — I10 Essential (primary) hypertension: Secondary | ICD-10-CM | POA: Insufficient documentation

## 2012-10-24 DIAGNOSIS — E119 Type 2 diabetes mellitus without complications: Secondary | ICD-10-CM | POA: Insufficient documentation

## 2012-10-24 DIAGNOSIS — I359 Nonrheumatic aortic valve disorder, unspecified: Secondary | ICD-10-CM

## 2012-10-24 DIAGNOSIS — E785 Hyperlipidemia, unspecified: Secondary | ICD-10-CM | POA: Insufficient documentation

## 2012-10-24 DIAGNOSIS — R011 Cardiac murmur, unspecified: Secondary | ICD-10-CM

## 2012-10-24 NOTE — Progress Notes (Signed)
*  PRELIMINARY RESULTS* Echocardiogram 2D Echocardiogram has been performed.  Stephanie Hendricks 10/24/2012, 11:39 AM

## 2012-12-01 ENCOUNTER — Other Ambulatory Visit (HOSPITAL_COMMUNITY): Payer: Self-pay | Admitting: Internal Medicine

## 2012-12-01 DIAGNOSIS — Z139 Encounter for screening, unspecified: Secondary | ICD-10-CM

## 2012-12-11 ENCOUNTER — Ambulatory Visit (HOSPITAL_COMMUNITY): Payer: Medicare Other

## 2012-12-18 ENCOUNTER — Ambulatory Visit (HOSPITAL_COMMUNITY)
Admission: RE | Admit: 2012-12-18 | Discharge: 2012-12-18 | Disposition: A | Discharge status: Home / Self Care | Payer: Medicare Other | Source: Ambulatory Visit | Attending: Internal Medicine | Admitting: Internal Medicine

## 2012-12-18 DIAGNOSIS — Z139 Encounter for screening, unspecified: Secondary | ICD-10-CM

## 2012-12-18 DIAGNOSIS — Z1231 Encounter for screening mammogram for malignant neoplasm of breast: Secondary | ICD-10-CM | POA: Insufficient documentation

## 2013-07-09 ENCOUNTER — Telehealth: Payer: Self-pay | Admitting: Gastroenterology

## 2013-07-09 NOTE — Telephone Encounter (Signed)
Going through overdue lab results. This lady has h/o microcytic anemia.   Can we see if PCP has current CBC on file?

## 2013-07-10 NOTE — Telephone Encounter (Signed)
I called Dr.Hall and they drew labs Thursday and they are going to fax [place] a copy of the results when they get back

## 2013-07-17 ENCOUNTER — Other Ambulatory Visit: Payer: Self-pay | Admitting: *Deleted

## 2013-07-17 MED ORDER — CLONIDINE HCL 0.3 MG/24HR TD PTWK: 1.0000 | MEDICATED_PATCH | TRANSDERMAL | Status: DC

## 2013-07-29 NOTE — Telephone Encounter (Signed)
I called Dr. Marcelo Baldy office about labs being faxed, I spoke with Lynden Ang and she said she will fax labs.

## 2013-07-29 NOTE — Telephone Encounter (Signed)
I never received these labs. Please request again.

## 2013-08-19 NOTE — Telephone Encounter (Signed)
Labs from 06/2013: BUN 21 Creatinine 1.59 WBC 5700 H/H 10.5/32.5 MCV 73.7 Platelet 271  Persistent microcytic anemia. ifobt negative 2013. To discuss further with Dr. Darrick Penna.  ?need for capsule endoscopy.

## 2013-08-20 NOTE — Telephone Encounter (Signed)
Call pt's home phone number and was told she had just left, call mobile. I called and LMOM to call.

## 2013-08-21 NOTE — Telephone Encounter (Signed)
PT IS A RMR PT. 

## 2013-08-25 NOTE — Telephone Encounter (Signed)
Routing to [place]. Per Dr. Darrick Penna this is RMR pt.

## 2013-08-26 NOTE — Telephone Encounter (Signed)
Yes. OV for microcytic anemia.

## 2013-08-26 NOTE — Telephone Encounter (Signed)
Stephanie Hendricks, we have not seen this pt in the office since 11/2011. Do you want her to have an ov?

## 2013-08-26 NOTE — Telephone Encounter (Signed)
Susan, please schedule ov 

## 2013-08-27 ENCOUNTER — Encounter: Payer: Self-pay | Admitting: Internal Medicine

## 2013-08-27 NOTE — Telephone Encounter (Signed)
Pt is aware of OV on 9/18 at 130 with [facility] and appt card was mailed

## 2013-09-09 ENCOUNTER — Other Ambulatory Visit: Payer: Self-pay | Admitting: Cardiology

## 2013-09-10 ENCOUNTER — Ambulatory Visit: Payer: Medicare Other | Admitting: Gastroenterology

## 2013-10-21 ENCOUNTER — Ambulatory Visit (INDEPENDENT_AMBULATORY_CARE_PROVIDER_SITE_OTHER): Payer: Medicare Other | Admitting: Ophthalmology

## 2013-10-21 DIAGNOSIS — H2510 Age-related nuclear cataract, unspecified eye: Secondary | ICD-10-CM

## 2013-10-21 DIAGNOSIS — H43819 Vitreous degeneration, unspecified eye: Secondary | ICD-10-CM

## 2013-10-21 DIAGNOSIS — I10 Essential (primary) hypertension: Secondary | ICD-10-CM

## 2013-10-21 DIAGNOSIS — E1139 Type 2 diabetes mellitus with other diabetic ophthalmic complication: Secondary | ICD-10-CM

## 2013-10-21 DIAGNOSIS — E11319 Type 2 diabetes mellitus with unspecified diabetic retinopathy without macular edema: Secondary | ICD-10-CM

## 2013-10-21 DIAGNOSIS — H35039 Hypertensive retinopathy, unspecified eye: Secondary | ICD-10-CM

## 2013-10-23 ENCOUNTER — Ambulatory Visit: Payer: Medicare Other | Admitting: Adult Health

## 2013-10-23 ENCOUNTER — Ambulatory Visit: Payer: Medicare Other | Admitting: Cardiology

## 2013-10-27 ENCOUNTER — Ambulatory Visit (INDEPENDENT_AMBULATORY_CARE_PROVIDER_SITE_OTHER): Payer: Medicare Other | Admitting: Adult Health

## 2013-10-27 ENCOUNTER — Encounter: Payer: Self-pay | Admitting: Adult Health

## 2013-10-27 VITALS — BP 162/66 | HR 68 | Ht 63.0 in | Wt 184.0 lb

## 2013-10-27 DIAGNOSIS — I498 Other specified cardiac arrhythmias: Secondary | ICD-10-CM

## 2013-10-27 DIAGNOSIS — I10 Essential (primary) hypertension: Secondary | ICD-10-CM

## 2013-10-27 DIAGNOSIS — R001 Bradycardia, unspecified: Secondary | ICD-10-CM

## 2013-10-27 DIAGNOSIS — I359 Nonrheumatic aortic valve disorder, unspecified: Secondary | ICD-10-CM

## 2013-10-27 NOTE — Patient Instructions (Signed)
Your physician recommends that you schedule a follow-up appointment in: 1 year  Read low sodium diet sheet provided to you  Your physician recommends that you continue on your current medications as directed. Please refer to the Current Medication list given to you today.

## 2013-10-27 NOTE — Assessment & Plan Note (Signed)
No significant bradycardia or symptoms associated. Continue current medications.

## 2013-10-27 NOTE — Assessment & Plan Note (Signed)
Continues significant murmur. Will repeat echo in one year.

## 2013-10-27 NOTE — Progress Notes (Signed)
HPI: [name] is 72 year old patient of Dr. Diona Browner were following for ongoing assessment and management of aortic valve disease, bradycardia, and hypertension. The patient has chronic New York Heart Association class II dyspnea on exertion. He was last seen by Dr. Diona Browner in October of 2013. Patient was continued on medical management. This patient became symptomatic followup echocardiogram was to be completed.   She comes today without cardiac complaint. She is now on a prednisone taper due to occular inflammation. She denies chest pain or palpitations.  No Known Allergies  Current Outpatient Prescriptions  Medication Sig Dispense Refill  . ACCU-CHEK AVIVA PLUS test strip 1 each by Other route as needed.       Marland Kitchen aspirin 81 MG tablet Take 81 mg by mouth daily.        . cetirizine (ZYRTEC) 10 MG tablet Take 10 mg by mouth daily.      . Cholecalciferol (VITAMIN D) 400 UNITS capsule Take 400 Units by mouth daily.      . cloNIDine (CATAPRES - DOSED IN MG/24 HR) 0.3 mg/24hr patch APPLY 1 PATCH ONCE WEEKLY.  4 patch  0  . EXFORGE HCT 10-320-25 MG TABS       . furosemide (LASIX) 20 MG tablet Take 20 mg by mouth daily.       Marland Kitchen GLIPIZIDE XL 5 MG 24 hr tablet       . JENTADUETO 2.5-500 MG TABS Take 1 tablet by mouth 2 (two) times daily.       . Multiple Vitamin (MULTIVITAMIN) capsule Take 1 capsule by mouth daily.        . potassium chloride SA (K-DUR,KLOR-CON) 20 MEQ tablet Take 20 mEq by mouth daily.       . simvastatin (ZOCOR) 20 MG tablet Take 20 mg by mouth at bedtime.       . predniSONE (DELTASONE) 20 MG tablet        No current facility-administered medications for this visit.    Past Medical History  Diagnosis Date  . Essential hypertension, benign   . Hyperlipidemia   . Diabetes mellitus type II     A1c of 7.2 in 07/2012  . Microcytic anemia   . Lower extremity edema   . Bradycardia   . Aortic valve sclerosis   . Degenerative joint disease   . Palpitations   . Chronic  kidney disease, stage III (moderate)     Creatinine of 1.49 in 07/2012    Past Surgical History  Procedure Laterality Date  . Tubal ligation    . Hemorroidectomy    . Tonsillectomy    . Colonoscopy w/ polypectomy  2005    Dr. Cora Daniels resected per patient  . Esophagogastroduodenoscopy      remote, foreign body extraction  . Colonoscopy  08/28/2011    sigmoid colon serated adenoma, next colonoscopy 08/2016  . Esophagogastroduodenoscopy  08/28/2011    hiatal hernia, erosion on fold of diaphragmatic hiatus (cameron lesion), antral and prepylori erosions, SB bx negative, gastric bx showed reactive gastropathy but no H.Pylori    ROS: Review of systems complete and found to be negative unless listed above  PHYSICAL EXAM BP 162/66  Pulse 68  Ht 5\' 3"  (1.6 m)  Wt 184 lb (83.462 kg)  BMI 32.60 kg/m2  General: Well developed, well nourished, in no acute distress Head: Eyes PERRLA, No xanthomas.   Normal cephalic and atramatic  Lungs: Clear bilaterally to auscultation and percussion. Heart: HRRR S1 S2, with 2/6 systolic murmur.  Pulses are 2+ & equal.            No carotid bruit. No JVD.  No abdominal bruits. No femoral bruits. Abdomen: Bowel sounds are positive, abdomen soft and non-tender without masses or                  Hernia's noted. Msk:  Back normal, normal gait. Normal strength and tone for age. Extremities: No clubbing, cyanosis 1+ pre-tibial edema.  DP +1 Neuro: Alert and oriented X 3. Psych:  Good affect, responds appropriately  EKG: NSR  ASSESSMENT AND PLAN

## 2013-10-27 NOTE — Assessment & Plan Note (Signed)
Slightly elevated today. She is eating a lot of salty foods, chips and snacks. I have advised her on a low sodium diet. She is given written instructions for same. She will continue current medication regimen to include HcTZ and Lasix.

## 2013-10-28 NOTE — Addendum Note (Signed)
Addended by: Thompson Grayer on: 10/28/2013 11:48 AM   Modules accepted: Orders

## 2013-11-06 ENCOUNTER — Telehealth: Payer: Self-pay | Admitting: Cardiology

## 2013-11-06 NOTE — Telephone Encounter (Signed)
Received fax refill request  Rx # [id number] Medication:  Clonidine TDS 0.3 mg Qty 4 Sig:  Apply 1 patch once weekly  Physician:  Diona Browner

## 2013-11-09 MED ORDER — CLONIDINE HCL 0.3 MG/24HR TD PTWK: MEDICATED_PATCH | TRANSDERMAL | Status: DC

## 2013-11-09 NOTE — Telephone Encounter (Signed)
Medication sent via escribe.  

## 2013-12-03 ENCOUNTER — Other Ambulatory Visit (HOSPITAL_COMMUNITY): Payer: Self-pay | Admitting: Internal Medicine

## 2013-12-03 DIAGNOSIS — Z139 Encounter for screening, unspecified: Secondary | ICD-10-CM

## 2013-12-21 ENCOUNTER — Ambulatory Visit (HOSPITAL_COMMUNITY)
Admission: RE | Admit: 2013-12-21 | Discharge: 2013-12-21 | Disposition: A | Discharge status: Home / Self Care | Payer: Medicare Other | Source: Ambulatory Visit | Attending: Internal Medicine | Admitting: Internal Medicine

## 2013-12-21 DIAGNOSIS — Z1231 Encounter for screening mammogram for malignant neoplasm of breast: Secondary | ICD-10-CM | POA: Insufficient documentation

## 2013-12-21 DIAGNOSIS — Z139 Encounter for screening, unspecified: Secondary | ICD-10-CM

## 2013-12-25 ENCOUNTER — Ambulatory Visit (HOSPITAL_COMMUNITY): Payer: Medicare Other

## 2014-03-30 ENCOUNTER — Telehealth: Payer: Self-pay | Admitting: Adult Health

## 2014-03-30 MED ORDER — CLONIDINE HCL 0.3 MG/24HR TD PTWK: MEDICATED_PATCH | TRANSDERMAL | Status: DC

## 2014-03-30 NOTE — Telephone Encounter (Signed)
Called [employer] to give pt 3 month supply with rf3

## 2014-03-30 NOTE — Telephone Encounter (Signed)
Received fax refill request  Rx # [id number] Medication:  Clonidine TDS 0.3mg  Qty 4 patch Sig:  Applyl one patch once weekly Physician:  Purcell Nails

## 2014-10-29 ENCOUNTER — Ambulatory Visit (INDEPENDENT_AMBULATORY_CARE_PROVIDER_SITE_OTHER): Payer: Medicare Other | Admitting: Adult Health

## 2014-10-29 ENCOUNTER — Encounter: Payer: Self-pay | Admitting: Adult Health

## 2014-10-29 VITALS — BP 142/58 | HR 72 | Ht 63.0 in | Wt 182.0 lb

## 2014-10-29 DIAGNOSIS — I10 Essential (primary) hypertension: Secondary | ICD-10-CM

## 2014-10-29 DIAGNOSIS — I350 Nonrheumatic aortic (valve) stenosis: Secondary | ICD-10-CM

## 2014-10-29 MED ORDER — AMLODIPINE BESYLATE-VALSARTAN 10-320 MG PO TABS: 1.0000 | ORAL_TABLET | Freq: Every day | ORAL | Status: DC

## 2014-10-29 MED ORDER — CLONIDINE HCL 0.3 MG/24HR TD PTWK: MEDICATED_PATCH | TRANSDERMAL | Status: DC

## 2014-10-29 MED ORDER — POTASSIUM CHLORIDE CRYS ER 20 MEQ PO TBCR: 20.0000 meq | EXTENDED_RELEASE_TABLET | Freq: Every day | ORAL | Status: DC

## 2014-10-29 MED ORDER — SIMVASTATIN 20 MG PO TABS: 20.0000 mg | ORAL_TABLET | Freq: Every day | ORAL | Status: DC

## 2014-10-29 NOTE — Patient Instructions (Signed)
Your physician wants you to follow-up in: 1 year You will receive a reminder letter in the mail two months in advance. If you don't receive a letter, please call our office to schedule the follow-up appointment.     Your physician recommends that you continue on your current medications as directed. Please refer to the Current Medication list given to you today.     Your physician has requested that you have an echocardiogram. Echocardiography is a painless test that uses sound waves to create images of your heart. It provides your doctor with information about the size and shape of your heart and how well your heart's chambers and valves are working. This procedure takes approximately one hour. There are no restrictions for this procedure.      Thank you for choosing [facility] !        

## 2014-10-29 NOTE — Assessment & Plan Note (Signed)
Follow blood pressure. She will be given refills on amlodipine, valsartan, clonidine patch, she is to avoid salty foods, which would help with some lower extremity edema, associated with these medications. She has had labs drawn every 3 months by her primary care physician. Therefore, I will not add any additional labs. We will see her again in one year and month. She is symptomatic.

## 2014-10-29 NOTE — Progress Notes (Signed)
HPI: Stephanie Hendricks is a 73 year old female patient of Dr. Domenic Polite, that we follow for ongoing assessment and management of aortic valve disease, bradycardia, and hypertension. The patient has chronic New York Heart Association class II dyspnea on exertion.   The patient was last seen in the office in November of 2014. At that time. She was stable from a cardiac standpoint. She is no evidence of significant bradycardia or symptoms associated with this. Echocardiogram will need to be repeated secondary to aortic valve disease.  She comes today without any complaints of dyspnea on exertion, chest pain, change in her energy level, palpitations, or dizziness. She is being treated for glaucoma. She is medically compliant. She denies any knee or visits, surgeries, or hospitalizations. Her only complaint is some mild lower extremity edema, which is dependent, especially at the end of the day. She is followed by Dr. Nevada Crane concerning diabetes management.  No Known Allergies  Current Outpatient Prescriptions  Medication Sig Dispense Refill  . ACCU-CHEK AVIVA PLUS test strip 1 each by Other route as needed.     Marland Kitchen aspirin 81 MG tablet Take 81 mg by mouth daily.      . Cholecalciferol (VITAMIN D) 400 UNITS capsule Take 400 Units by mouth daily.    Marland Kitchen GLIPIZIDE XL 5 MG 24 hr tablet     . JENTADUETO 2.5-500 MG TABS Take 1 tablet by mouth 2 (two) times daily.     . Multiple Vitamin (MULTIVITAMIN) capsule Take 1 capsule by mouth daily.      . simvastatin (ZOCOR) 20 MG tablet Take 1 tablet (20 mg total) by mouth at bedtime. 90 tablet 3  . amLODipine-valsartan (EXFORGE) 10-320 MG per tablet Take 1 tablet by mouth daily. 90 tablet 3  . cloNIDine (CATAPRES - DOSED IN MG/24 HR) 0.3 mg/24hr patch APPLY 1 PATCH ONCE WEEKLY. 12 patch 3  . potassium chloride SA (K-DUR,KLOR-CON) 20 MEQ tablet Take 1 tablet (20 mEq total) by mouth daily. 90 tablet 3   No current facility-administered medications for this visit.     Past Medical History  Diagnosis Date  . Essential hypertension, benign   . Hyperlipidemia   . Diabetes mellitus type II     A1c of 7.2 in 07/2012  . Microcytic anemia   . Lower extremity edema   . Bradycardia   . Aortic valve sclerosis   . Degenerative joint disease   . Palpitations   . Chronic kidney disease, stage III (moderate)     Creatinine of 1.49 in 07/2012    Past Surgical History  Procedure Laterality Date  . Tubal ligation    . Hemorroidectomy    . Tonsillectomy    . Colonoscopy w/ polypectomy  2005    Dr. Tamela Oddi resected per patient  . Esophagogastroduodenoscopy      remote, foreign body extraction  . Colonoscopy  08/28/2011    sigmoid colon serated adenoma, next colonoscopy 08/2016  . Esophagogastroduodenoscopy  08/28/2011    hiatal hernia, erosion on fold of diaphragmatic hiatus (cameron lesion), antral and prepylori erosions, SB bx negative, gastric bx showed reactive gastropathy but no H.Pylori    ROS: Review of systems complete and found to be negative unless listed above  PHYSICAL EXAM BP 142/58 mmHg  Pulse 72  Ht 5\' 3"  (1.6 m)  Wt 182 lb (82.555 kg)  BMI 32.25 kg/m2 General: Well developed, well nourished, in no acute distress, obese  Head: Eyes PERRLA, No xanthomas.   Normal cephalic and atramatic  Lungs:  Clear bilaterally to auscultation and percussion. Heart: HRRR S1 S2, with 2/6 systolic murmur with preserved S2.  Pulses are 2+ & equal.            No carotid bruit. No JVD.  No abdominal bruits. No femoral bruits. Abdomen: Bowel sounds are positive, abdomen soft and non-tender without masses or                  Hernia's noted. Msk:  Back khyphosis in noted normall strength and tone for age. Extremities: No clubbing, cyanosis, mild 1+ edema.  DP +1 Neuro: Alert and oriented X 3. Psych:  Good affect, responds appropriately  EKG: NSR rate of 65 bpm.  ASSESSMENT AND PLAN

## 2014-10-29 NOTE — Progress Notes (Deleted)
Name: Stephanie Hendricks    DOB: 05-03-1941  Age: 73 y.o.  MR#: [id number]       PCP:  Delphina Cahill, MD      Insurance: Payor: [employer] MEDICARE / Plan: AARP MEDICARE COMPLETE / Product Type: *No Product type* /   CC:    Chief Complaint  Patient presents with  . Aortic Insuffiency  . Bradycardia  . Hypertension    VS Filed Vitals:   10/29/14 1456  BP: 142/58  Pulse: 72  Height: 5' 3"  (1.6 m)  Weight: 182 lb (82.555 kg)    Weights Current Weight  10/29/14 182 lb (82.555 kg)  10/27/13 184 lb (83.462 kg)  10/22/12 174 lb 0.6 oz (78.944 kg)    Blood Pressure  BP Readings from Last 3 Encounters:  10/29/14 142/58  10/27/13 162/66  10/22/12 117/83     Admit date:  (Not on file) Last encounter with RMR:  Visit date not found   Allergy Review of patient's allergies indicates no known allergies.  Current Outpatient Prescriptions  Medication Sig Dispense Refill  . ACCU-CHEK AVIVA PLUS test strip 1 each by Other route as needed.     Marland Kitchen aspirin 81 MG tablet Take 81 mg by mouth daily.      . Cholecalciferol (VITAMIN D) 400 UNITS capsule Take 400 Units by mouth daily.    Marland Kitchen GLIPIZIDE XL 5 MG 24 hr tablet     . JENTADUETO 2.5-500 MG TABS Take 1 tablet by mouth 2 (two) times daily.     . Multiple Vitamin (MULTIVITAMIN) capsule Take 1 capsule by mouth daily.      . simvastatin (ZOCOR) 20 MG tablet Take 20 mg by mouth at bedtime.     Marland Kitchen amLODipine-valsartan (EXFORGE) 10-320 MG per tablet Take 1 tablet by mouth daily.     . cloNIDine (CATAPRES - DOSED IN MG/24 HR) 0.3 mg/24hr patch APPLY 1 PATCH ONCE WEEKLY. 12 patch 3   No current facility-administered medications for this visit.    Discontinued Meds:    Medications Discontinued During This Encounter  Medication Reason  . cetirizine (ZYRTEC) 10 MG tablet Error  . furosemide (LASIX) 20 MG tablet Error  . potassium chloride SA (K-DUR,KLOR-CON) 20 MEQ tablet Error  . predniSONE (DELTASONE) 20 MG tablet Error  . EXFORGE HCT  10-320-25 MG TABS Error    Patient Active Problem List   Diagnosis Date Noted  . Aortic valve disorders 10/22/2012  . Bradycardia   . Hypertension   . Hyperlipidemia   . Diabetes mellitus type II   . Hx of colonic polyps 08/21/2011    LABS    Component Value Date/Time   NA 146 11/13/2011 0919   NA 143 08/10/2011 0859   NA 142 01/11/2009 0125   NA 141 10/20/2007 1422   K 3.9 08/10/2011 0859   K 3.6 01/11/2009 0125   K 3.6 10/20/2007 1422   CL 107 01/11/2009 0125   CL 105 10/20/2007 1422   CO2 28 01/11/2009 0125   CO2 29 10/20/2007 1422   GLUCOSE 143* 01/11/2009 0125   GLUCOSE 114* 10/20/2007 1422   BUN 21 07/15/2013   BUN 28* 11/13/2011 0919   BUN 22* 08/10/2011 0859   BUN 10 01/11/2009 0125   BUN 12 10/20/2007 1422   CREATININE 1.59 07/15/2013   CREATININE 1.47 11/13/2011 0919   CREATININE 1.36 08/10/2011 0859   CREATININE 0.88 01/11/2009 0125   CREATININE 0.84 10/20/2007 1422   CALCIUM 9.9 11/13/2011 0919  CALCIUM 9.8 08/10/2011 0859   CALCIUM 9.6 01/11/2009 0125   CALCIUM 9.4 10/20/2007 1422   GFRNONAA >60 01/11/2009 0125   GFRNONAA >60 10/20/2007 1422   GFRAA  01/11/2009 0125    >60        The eGFR has been calculated using the MDRD equation. This calculation has not been validated in all clinical situations. eGFR's persistently <60 mL/min signify possible Chronic Kidney Disease.   GFRAA  10/20/2007 1422    >60        The eGFR has been calculated using the MDRD equation. This calculation has not been validated in all clinical   CMP     Component Value Date/Time   NA 146 11/13/2011 0919   NA 142 01/11/2009 0125   K 3.9 08/10/2011 0859   K 3.6 01/11/2009 0125   CL 107 01/11/2009 0125   CO2 28 01/11/2009 0125   GLUCOSE 143* 01/11/2009 0125   BUN 21 07/15/2013   BUN 10 01/11/2009 0125   CREATININE 1.59 07/15/2013   CREATININE 0.88 01/11/2009 0125   CALCIUM 9.9 11/13/2011 0919   CALCIUM 9.6 01/11/2009 0125   PROT 8.0 10/20/2007 1422    ALBUMIN 4.2 10/20/2007 1422   AST 13 11/13/2011 0919   AST 24 10/20/2007 1422   ALT 10 11/13/2011 0919   ALKPHOS 48.0 11/13/2011 0919   ALKPHOS 56 10/20/2007 1422   BILITOT 0.4 11/13/2011 0919   BILITOT 0.8 10/20/2007 1422   GFRNONAA >60 01/11/2009 0125   GFRAA  01/11/2009 0125    >60        The eGFR has been calculated using the MDRD equation. This calculation has not been validated in all clinical situations. eGFR's persistently <60 mL/min signify possible Chronic Kidney Disease.       Component Value Date/Time   WBC 6.4 03/25/2012 1145   WBC 5.0 11/13/2011 0923   WBC 5.0 08/10/2011 0900   HGB 10.0* 03/25/2012 1145   HGB 10.4* 11/13/2011 0923   HGB 9.4* 08/10/2011 0900   HCT 32.7* 03/25/2012 1145   HCT 34 11/13/2011 0923   HCT 31 08/10/2011 0900   HCT 39.3 01/11/2009 0125   HCT 38.6 10/20/2007 1422   MCV 77.5* 03/25/2012 1145   MCV 77.5 11/13/2011 0923   MCV 77.7 08/10/2011 0900   MCV 76.5* 01/11/2009 0125   MCV 77.2* 10/20/2007 1422    Lipid Panel  No results found for: CHOL, TRIG, HDL, CHOLHDL, VLDL, LDLCALC, LDLDIRECT  ABG No results found for: PHART, PCO2ART, PO2ART, HCO3, TCO2, ACIDBASEDEF, O2SAT   Lab Results  Component Value Date   TSH 2.828 08/19/2012   BNP (last 3 results) No results for input(s): PROBNP in the last 8760 hours. Cardiac Panel (last 3 results) No results for input(s): CKTOTAL, CKMB, TROPONINI, RELINDX in the last 72 hours.  Iron/TIBC/Ferritin/ %Sat    Component Value Date/Time   IRON 51 03/28/2012 1827   TIBC 295 03/28/2012 1827   FERRITIN 126 03/28/2012 1827   FERRITIN 164.0* 11/13/2011 0925   IRONPCTSAT 17* 03/28/2012 1827     EKG Orders placed or performed in visit on 10/27/13  . EKG 12-Lead     Prior Assessment and Plan Problem List as of 10/29/2014      Cardiovascular and Mediastinum   Hypertension   Last Assessment & Plan   10/27/2013 Office Visit Written 10/27/2013  3:03 PM by Lendon Colonel, NP    Slightly  elevated today. She is eating a lot of salty foods, chips and  snacks. I have advised her on a low sodium diet. She is given written instructions for same. She will continue current medication regimen to include HcTZ and Lasix.    Aortic valve disorders   Last Assessment & Plan   10/27/2013 Office Visit Written 10/27/2013  3:04 PM by Lendon Colonel, NP    Continues significant murmur. Will repeat echo in one year.      Endocrine   Diabetes mellitus type II     Other   Hx of colonic polyps   Last Assessment & Plan   12/04/2011 Office Visit Written 12/04/2011 11:03 AM by Mahala Menghini, PA    Next colonoscopy in September 2017.    Bradycardia   Last Assessment & Plan   10/27/2013 Office Visit Written 10/27/2013  3:04 PM by Lendon Colonel, NP    No significant bradycardia or symptoms associated. Continue current medications.    Hyperlipidemia   Last Assessment & Plan   08/19/2012 Office Visit Written 08/19/2012 12:31 PM by Yehuda Savannah, MD    Lipid profile was good, especially in the absence of known vascular disease, when last assessed earlier this month.        Imaging: No results found.

## 2014-10-29 NOTE — Assessment & Plan Note (Signed)
She is scheduled to have an echocardiogram for reevaluation and ongoing assessment of her aortic valve disease. She wishes to wait to have this completed until after the first of the year. This will be scheduled on her discharge from the office. At this time, she is completely asymptomatic. Heart rate is controlled. We will see her again in one year unless echocardiogram revealed significant changes in her aortic valve function.

## 2014-11-05 ENCOUNTER — Observation Stay (HOSPITAL_COMMUNITY)
Admission: EM | Admit: 2014-11-05 | Discharge: 2014-11-07 | Disposition: A | Discharge status: Home / Self Care | Payer: Medicare Other | Attending: Internal Medicine | Admitting: Internal Medicine

## 2014-11-05 ENCOUNTER — Encounter (HOSPITAL_COMMUNITY): Payer: Self-pay | Admitting: Emergency Medicine

## 2014-11-05 DIAGNOSIS — N183 Chronic kidney disease, stage 3 unspecified: Secondary | ICD-10-CM

## 2014-11-05 DIAGNOSIS — D509 Iron deficiency anemia, unspecified: Secondary | ICD-10-CM | POA: Insufficient documentation

## 2014-11-05 DIAGNOSIS — E785 Hyperlipidemia, unspecified: Secondary | ICD-10-CM | POA: Insufficient documentation

## 2014-11-05 DIAGNOSIS — E119 Type 2 diabetes mellitus without complications: Secondary | ICD-10-CM | POA: Insufficient documentation

## 2014-11-05 DIAGNOSIS — E669 Obesity, unspecified: Secondary | ICD-10-CM | POA: Diagnosis present

## 2014-11-05 DIAGNOSIS — Z79899 Other long term (current) drug therapy: Secondary | ICD-10-CM | POA: Insufficient documentation

## 2014-11-05 DIAGNOSIS — Z86010 Personal history of colonic polyps: Secondary | ICD-10-CM

## 2014-11-05 DIAGNOSIS — I358 Other nonrheumatic aortic valve disorders: Secondary | ICD-10-CM | POA: Insufficient documentation

## 2014-11-05 DIAGNOSIS — Z7982 Long term (current) use of aspirin: Secondary | ICD-10-CM | POA: Insufficient documentation

## 2014-11-05 DIAGNOSIS — E1121 Type 2 diabetes mellitus with diabetic nephropathy: Secondary | ICD-10-CM | POA: Diagnosis present

## 2014-11-05 DIAGNOSIS — K529 Noninfective gastroenteritis and colitis, unspecified: Secondary | ICD-10-CM | POA: Diagnosis present

## 2014-11-05 DIAGNOSIS — R001 Bradycardia, unspecified: Secondary | ICD-10-CM

## 2014-11-05 DIAGNOSIS — I129 Hypertensive chronic kidney disease with stage 1 through stage 4 chronic kidney disease, or unspecified chronic kidney disease: Secondary | ICD-10-CM | POA: Diagnosis not present

## 2014-11-05 DIAGNOSIS — R1110 Vomiting, unspecified: Secondary | ICD-10-CM

## 2014-11-05 DIAGNOSIS — M1990 Unspecified osteoarthritis, unspecified site: Secondary | ICD-10-CM | POA: Insufficient documentation

## 2014-11-05 DIAGNOSIS — I10 Essential (primary) hypertension: Secondary | ICD-10-CM | POA: Diagnosis present

## 2014-11-05 DIAGNOSIS — R112 Nausea with vomiting, unspecified: Principal | ICD-10-CM | POA: Insufficient documentation

## 2014-11-05 MED ORDER — ONDANSETRON 8 MG PO TBDP
8.0000 mg | ORAL_TABLET | Freq: Once | ORAL | Status: AC
  Administered 2014-11-06: 8 mg via ORAL
  Filled 2014-11-05: qty 1

## 2014-11-05 NOTE — ED Notes (Signed)
Patient started a muscle relaxer yesterday and began vomiting about an hour ago.

## 2014-11-06 ENCOUNTER — Encounter (HOSPITAL_COMMUNITY): Payer: Self-pay | Admitting: Internal Medicine

## 2014-11-06 ENCOUNTER — Emergency Department (HOSPITAL_COMMUNITY): Payer: Medicare Other

## 2014-11-06 DIAGNOSIS — R1110 Vomiting, unspecified: Secondary | ICD-10-CM

## 2014-11-06 DIAGNOSIS — D509 Iron deficiency anemia, unspecified: Secondary | ICD-10-CM | POA: Diagnosis present

## 2014-11-06 DIAGNOSIS — E669 Obesity, unspecified: Secondary | ICD-10-CM | POA: Diagnosis present

## 2014-11-06 DIAGNOSIS — R112 Nausea with vomiting, unspecified: Secondary | ICD-10-CM | POA: Diagnosis present

## 2014-11-06 DIAGNOSIS — N183 Chronic kidney disease, stage 3 unspecified: Secondary | ICD-10-CM | POA: Diagnosis present

## 2014-11-06 DIAGNOSIS — I10 Essential (primary) hypertension: Secondary | ICD-10-CM

## 2014-11-06 LAB — COMPREHENSIVE METABOLIC PANEL
ALK PHOS: 91 U/L (ref 39–117)
ALT: 11 U/L (ref 0–35)
ANION GAP: 14 (ref 5–15)
AST: 13 U/L (ref 0–37)
Albumin: 4 g/dL (ref 3.5–5.2)
BUN: 28 mg/dL — AB (ref 6–23)
CO2: 28 mEq/L (ref 19–32)
Calcium: 9.8 mg/dL (ref 8.4–10.5)
Chloride: 100 mEq/L (ref 96–112)
Creatinine, Ser: 1.86 mg/dL — ABNORMAL HIGH (ref 0.50–1.10)
GFR calc Af Amer: 29 mL/min — ABNORMAL LOW (ref >90)
GFR, EST NON AFRICAN AMERICAN: 25 mL/min — AB (ref >90)
GLUCOSE: 182 mg/dL — AB (ref 70–99)
POTASSIUM: 3.8 meq/L (ref 3.7–5.3)
SODIUM: 142 meq/L (ref 137–147)
TOTAL PROTEIN: 8.8 g/dL — AB (ref 6.0–8.3)
Total Bilirubin: 0.2 mg/dL — ABNORMAL LOW (ref 0.3–1.2)

## 2014-11-06 LAB — BASIC METABOLIC PANEL
Anion gap: 11 (ref 5–15)
BUN: 27 mg/dL — ABNORMAL HIGH (ref 6–23)
CO2: 29 mEq/L (ref 19–32)
Calcium: 9.2 mg/dL (ref 8.4–10.5)
Chloride: 104 mEq/L (ref 96–112)
Creatinine, Ser: 1.63 mg/dL — ABNORMAL HIGH (ref 0.50–1.10)
GFR calc Af Amer: 34 mL/min — ABNORMAL LOW (ref >90)
GFR, EST NON AFRICAN AMERICAN: 30 mL/min — AB (ref >90)
Glucose, Bld: 182 mg/dL — ABNORMAL HIGH (ref 70–99)
Potassium: 4.5 mEq/L (ref 3.7–5.3)
SODIUM: 144 meq/L (ref 137–147)

## 2014-11-06 LAB — HEMOGLOBIN A1C
HEMOGLOBIN A1C: 6.5 % — AB (ref <5.7)
Mean Plasma Glucose: 140 mg/dL — ABNORMAL HIGH (ref <117)

## 2014-11-06 LAB — GLUCOSE, CAPILLARY
Glucose-Capillary: 151 mg/dL — ABNORMAL HIGH (ref 70–99)
Glucose-Capillary: 145 mg/dL — ABNORMAL HIGH (ref 70–99)
Glucose-Capillary: 52 mg/dL — ABNORMAL LOW (ref 70–99)
Glucose-Capillary: 82 mg/dL (ref 70–99)

## 2014-11-06 LAB — URINALYSIS, ROUTINE W REFLEX MICROSCOPIC
Bilirubin Urine: NEGATIVE
Glucose, UA: NEGATIVE mg/dL
Hgb urine dipstick: NEGATIVE
Ketones, ur: NEGATIVE mg/dL
Leukocytes, UA: NEGATIVE
Nitrite: NEGATIVE
PH: 5.5 (ref 5.0–8.0)
Protein, ur: 30 mg/dL — AB
Specific Gravity, Urine: 1.02 (ref 1.005–1.030)
Urobilinogen, UA: 0.2 mg/dL (ref 0.0–1.0)

## 2014-11-06 LAB — CBC WITH DIFFERENTIAL/PLATELET
Basophils Absolute: 0 10*3/uL (ref 0.0–0.1)
Basophils Relative: 0 % (ref 0–1)
EOS PCT: 1 % (ref 0–5)
Eosinophils Absolute: 0.1 10*3/uL (ref 0.0–0.7)
HCT: 34.1 % — ABNORMAL LOW (ref 36.0–46.0)
Hemoglobin: 10.7 g/dL — ABNORMAL LOW (ref 12.0–15.0)
LYMPHS ABS: 4.4 10*3/uL — AB (ref 0.7–4.0)
Lymphocytes Relative: 33 % (ref 12–46)
MCH: 24 pg — AB (ref 26.0–34.0)
MCHC: 31.4 g/dL (ref 30.0–36.0)
MCV: 76.5 fL — AB (ref 78.0–100.0)
Monocytes Absolute: 0.8 10*3/uL (ref 0.1–1.0)
Monocytes Relative: 6 % (ref 3–12)
Neutro Abs: 8.2 10*3/uL — ABNORMAL HIGH (ref 1.7–7.7)
Neutrophils Relative %: 60 % (ref 43–77)
PLATELETS: 261 10*3/uL (ref 150–400)
RBC: 4.46 MIL/uL (ref 3.87–5.11)
RDW: 14.5 % (ref 11.5–15.5)
WBC: 13.6 10*3/uL — ABNORMAL HIGH (ref 4.0–10.5)

## 2014-11-06 LAB — TSH: TSH: 2.32 u[IU]/mL (ref 0.350–4.500)

## 2014-11-06 LAB — URINE MICROSCOPIC-ADD ON

## 2014-11-06 LAB — CBG MONITORING, ED: GLUCOSE-CAPILLARY: 171 mg/dL — AB (ref 70–99)

## 2014-11-06 LAB — LIPASE, BLOOD: Lipase: 55 U/L (ref 11–59)

## 2014-11-06 MED ORDER — PANTOPRAZOLE SODIUM 40 MG IV SOLR
40.0000 mg | INTRAVENOUS | Status: DC
  Administered 2014-11-06 – 2014-11-07 (×2): 40 mg via INTRAVENOUS
  Filled 2014-11-06 (×2): qty 40

## 2014-11-06 MED ORDER — SODIUM CHLORIDE 0.9 % IV SOLN: INTRAVENOUS | Status: DC

## 2014-11-06 MED ORDER — ONDANSETRON HCL 4 MG/2ML IJ SOLN
4.0000 mg | INTRAMUSCULAR | Status: AC | PRN
  Administered 2014-11-06 (×2): 4 mg via INTRAVENOUS
  Filled 2014-11-06 (×2): qty 2

## 2014-11-06 MED ORDER — POTASSIUM CHLORIDE IN NACL 20-0.45 MEQ/L-% IV SOLN
INTRAVENOUS | Status: DC
Start: 2014-11-06 — End: 2014-11-07
  Administered 2014-11-06 (×2): via INTRAVENOUS
  Filled 2014-11-06 (×6): qty 1000

## 2014-11-06 MED ORDER — ONDANSETRON 4 MG PO TBDP
4.0000 mg | ORAL_TABLET | Freq: Once | ORAL | Status: DC
  Filled 2014-11-06: qty 1

## 2014-11-06 MED ORDER — ACETAMINOPHEN 650 MG RE SUPP: 650.0000 mg | Freq: Four times a day (QID) | RECTAL | Status: DC | PRN

## 2014-11-06 MED ORDER — ONDANSETRON HCL 4 MG/2ML IJ SOLN: 4.0000 mg | INTRAMUSCULAR | Status: DC | PRN

## 2014-11-06 MED ORDER — HEPARIN SODIUM (PORCINE) 5000 UNIT/ML IJ SOLN
5000.0000 [IU] | Freq: Three times a day (TID) | INTRAMUSCULAR | Status: DC
  Administered 2014-11-06 – 2014-11-07 (×4): 5000 [IU] via SUBCUTANEOUS
  Filled 2014-11-06 (×4): qty 1

## 2014-11-06 MED ORDER — PROMETHAZINE HCL 12.5 MG PO TABS: 6.2500 mg | ORAL_TABLET | Freq: Four times a day (QID) | ORAL | Status: DC | PRN

## 2014-11-06 MED ORDER — AMLODIPINE BESYLATE-VALSARTAN 10-320 MG PO TABS: 1.0000 | ORAL_TABLET | Freq: Every day | ORAL | Status: DC

## 2014-11-06 MED ORDER — INSULIN ASPART 100 UNIT/ML ~~LOC~~ SOLN
0.0000 [IU] | SUBCUTANEOUS | Status: DC
  Administered 2014-11-06 (×2): 2 [IU] via SUBCUTANEOUS
  Administered 2014-11-06: 1 [IU] via SUBCUTANEOUS

## 2014-11-06 MED ORDER — ONDANSETRON 4 MG PO TBDP: 4.0000 mg | ORAL_TABLET | Freq: Three times a day (TID) | ORAL | Status: DC | PRN

## 2014-11-06 MED ORDER — ALUM & MAG HYDROXIDE-SIMETH 200-200-20 MG/5ML PO SUSP: 30.0000 mL | Freq: Four times a day (QID) | ORAL | Status: DC | PRN

## 2014-11-06 MED ORDER — AMLODIPINE BESYLATE 5 MG PO TABS
10.0000 mg | ORAL_TABLET | Freq: Every day | ORAL | Status: DC
  Administered 2014-11-06 – 2014-11-07 (×2): 10 mg via ORAL
  Filled 2014-11-06 (×2): qty 2

## 2014-11-06 MED ORDER — SIMVASTATIN 20 MG PO TABS
20.0000 mg | ORAL_TABLET | Freq: Every day | ORAL | Status: DC
  Administered 2014-11-06: 20 mg via ORAL
  Filled 2014-11-06: qty 1

## 2014-11-06 MED ORDER — ACETAMINOPHEN 325 MG PO TABS
650.0000 mg | ORAL_TABLET | Freq: Four times a day (QID) | ORAL | Status: DC | PRN
  Administered 2014-11-06: 650 mg via ORAL
  Filled 2014-11-06: qty 2

## 2014-11-06 MED ORDER — POTASSIUM CHLORIDE IN NACL 20-0.45 MEQ/L-% IV SOLN
INTRAVENOUS | Status: AC
  Filled 2014-11-06: qty 1000

## 2014-11-06 MED ORDER — CLONIDINE HCL 0.3 MG/24HR TD PTWK
0.3000 mg | MEDICATED_PATCH | TRANSDERMAL | Status: DC
  Filled 2014-11-06: qty 1

## 2014-11-06 MED ORDER — SODIUM CHLORIDE 0.9 % IV SOLN
INTRAVENOUS | Status: DC
  Administered 2014-11-06: 03:00:00 via INTRAVENOUS

## 2014-11-06 MED ORDER — CLONIDINE HCL 0.3 MG/24HR TD PTWK: 0.3000 mg | MEDICATED_PATCH | TRANSDERMAL | Status: DC

## 2014-11-06 MED ORDER — MORPHINE SULFATE 2 MG/ML IJ SOLN: 2.0000 mg | INTRAMUSCULAR | Status: DC | PRN

## 2014-11-06 MED ORDER — IRBESARTAN 300 MG PO TABS
300.0000 mg | ORAL_TABLET | Freq: Every day | ORAL | Status: DC
Start: 2014-11-06 — End: 2014-11-07
  Administered 2014-11-06 – 2014-11-07 (×2): 300 mg via ORAL
  Filled 2014-11-06 (×2): qty 1

## 2014-11-06 MED ORDER — ONDANSETRON HCL 4 MG/2ML IJ SOLN
4.0000 mg | Freq: Four times a day (QID) | INTRAMUSCULAR | Status: DC
Start: 2014-11-06 — End: 2014-11-07
  Administered 2014-11-06 – 2014-11-07 (×5): 4 mg via INTRAVENOUS
  Filled 2014-11-06 (×4): qty 2

## 2014-11-06 NOTE — ED Notes (Signed)
Patient vomiting again. Dr. Thurnell Garbe notified.

## 2014-11-06 NOTE — Discharge Instructions (Signed)
°Emergency Department Resource Guide °1) Find a Doctor and Pay Out of Pocket °Although you won't have to find out who is covered by your insurance plan, it is a good idea to ask around and get recommendations. You will then need to call the office and see if the doctor you have chosen will accept you as a new patient and what types of options they offer for patients who are self-pay. Some doctors offer discounts or will set up payment plans for their patients who do not have insurance, but you will need to ask so you aren't surprised when you get to your appointment. ° °2) Contact Your Local Health Department °Not all health departments have doctors that can see patients for sick visits, but many do, so it is worth a call to see if yours does. If you don't know where your local health department is, you can check in your phone book. The CDC also has a tool to help you locate your state's health department, and many state websites also have listings of all of their local health departments. ° °3) Find a Walk-in Clinic °If your illness is not likely to be very severe or complicated, you may want to try a walk in clinic. These are popping up all over the country in pharmacies, drugstores, and shopping centers. They're usually staffed by nurse practitioners or physician assistants that have been trained to treat common illnesses and complaints. They're usually fairly quick and inexpensive. However, if you have serious medical issues or chronic medical problems, these are probably not your best option. ° °No Primary Care Doctor: °- Call [facility] at  832-8000 - they can help you locate a primary care doctor that  accepts your insurance, provides certain services, etc. °- Physician Referral Service- 1-800-533-3463 ° °Chronic Pain Problems: °Organization         Address  Phone   Notes  °[facility]  (336) 297-2271 Patients need to be referred by their primary care doctor.  ° °Medication  Assistance: °Organization         Address  Phone   Notes  °[place] [facility] [address] °[place], [place] [address] (336) 641-8030 --Must be a resident of [place] °-- Must have NO insurance coverage whatsoever (no Medicaid/ Medicare, etc.) °-- The pt. MUST have a primary care doctor that directs their care regularly and follows them in the community °  °[facility]  (866) 331-1348   °[employer]  (888) 892-1162   ° °Agencies that provide inexpensive medical care: °Organization         Address  Phone   Notes  °[facility]  (336) 832-8035   °[facility]    (336) 832-7272   °[facility] [address] °[place], [place] [address] (336) 832-4777   °[facility] [address], °[place] (336) 271-4999   °[facility]    (336) 373-0678   °[facility]    (336) 272-1050   °[facility] ° [address], [place] Phone:  (336) 832-4444, Fax:  (336) 832-4440 Hours of Operation:  9 am - 6 pm, M-F.  Also accepts Medicaid/Medicare and self-pay.  °[facility] ° [address], [place] Phone: (336) [phone], Fax: (336) [phone]. Hours of Operation:  8:30 am - 5:30 pm, M-F.  Also accepts Medicaid and self-pay.  °[facility] [place] [address], [place] Phone: (336) 878-6027   °[facility] [address], [place], [place] (336)723-1848, Ext. 123 Mondays & Thursdays: 7-9 AM.  First 15 patients are seen on a first come, first serve basis. °  ° °Medicaid-accepting [place] Providers: ° °Organization         Address  Phone   Notes  °[facility] [address], [place] (336) 641-2100 Also accepts self-pay patients.  °[facility] [address], [place] ° (336) 856-9996   °[facility] [address], [place]  (336) 288-8857   °[facility] [address] [place] [address], [place] (336) 299-7000   °Veita Bland [address], [place]  ° (336) 373-1557 Only accepts [place] Access Medicaid patients after they have their name applied to their card.  ° °Self-Pay (no insurance) in [place]: ° °Organization         Address  Phone   Notes  °Sickle Cell Patients, [facility] [address], [place] (336) 832-1970   °[facility] [address], [place] (336) 832-4400   °[facility] ° [address], [place] (336) 992-4800   °[facility]/Dr. Osei-Bonsu ° [address] [place] [address], [place] or [address], [place] (336) [phone] Phone number for both [place] and [place] locations is the same.  °[facility] [address], [place] (336) 299-0000   °[facility] [address] [place] [address], [place] or [address] (336) 852-7530 °(336) 878-2260   °[facility] [address], [place] (336) 350-1642, phone; (336) 294-5005, fax Sees patients 1st and 3rd Saturday of every month.  Must not qualify for public or private insurance (i.e. Medicaid, Medicare, [place] Health Choice, Veterans' Benefits) • Household income should be no more than 200% of the poverty level •The clinic cannot treat you if you are pregnant or think you are pregnant • Sexually transmitted diseases are not treated at the clinic.  ° ° °Dental Care: °Organization         Address  Phone  Notes  °[place] [facility] [address], [place] (336) 641-6152 Accepts children up to age 21 who are enrolled in Medicaid or [place] Health Choice; pregnant women with a Medicaid card; and children who have applied for Medicaid or [place] Health Choice, but were declined, whose parents can pay a reduced fee at time of service.  °[place]  [facility] [place]  [address], [place] (336) 641-7733 Accepts children up to age 21 who are enrolled in Medicaid or [place] Health Choice; pregnant women with a Medicaid card; and children who have applied for Medicaid or [place] Health Choice, but were declined, whose parents can pay a reduced fee at time of service.  °[facility] ° [address], [place] (336) 641-4533 Patients are seen by appointment only. Walk-ins are not accepted. [facility] will see patients 18 years of age and older. °Monday - Tuesday (8am-5pm) °Most Wednesdays (8:30-5pm) °$30 per visit, cash only  °[facility] ° [address], [place] (336) 641-4533 Patients are seen by appointment only. Walk-ins are not accepted. [facility] will see patients 18 years of age and older. °One   Wednesday Evening (Monthly: Volunteer Based).  $30 per visit, cash only  °[facility]  (919) 537-3737 for adults; Children under age 4, call Graduate Pediatric Dentistry at ([phone]) [phone]. Children aged 4-14, please call (919) [phone] to request a pediatric application. ° Dental services are provided in all areas of dental care including fillings, crowns and bridges, complete and partial dentures, implants, gum treatment, root canals, and extractions. Preventive care is also provided. Treatment is provided to both adults and children. °Patients are selected via a lottery and there is often a waiting list. °  °[facility] [address], °[place] ° (336) 763-8833 www.drcivils.com °  °[facility] [address], [place], [place] (336)723-1848, Ext. 123 Second and Fourth Thursday of each month, opens at 6:30 AM; Clinic ends at 9 AM.  Patients are seen on a first-come first-served basis, and a limited number are seen during each clinic.  ° °[facility] ° [address], [place], [place] (336) [phone]    Eligibility Requirements °You must have lived in [place], [place], or [place] counties for at least the last three months. °  You cannot be eligible for state or federal sponsored healthcare insurance, including Veterans Administration, Medicaid, or Medicare. °  You generally cannot be eligible for healthcare insurance through your employer.  °  How to apply: °Eligibility screenings are held every Tuesday and Wednesday afternoon from 1:00 pm until 4:00 pm. You do not need an appointment for the interview!  °[facility] [address], [place], [place] 336-631-2330   °[facility]  336-342-8273   °[facility]  336-703-3100   °[facility]  336-570-6415   ° °Behavioral Health Resources in the Community: °Intensive Outpatient Programs °Organization         Address  Phone  Notes  °[facility] [address], [place], [place] 336-878-6098   °[facility] [address], [place], [place] 336-832-9800   °[facility] [address], [place], [place] ° 336-882-2125   °[facility] [address],  °[place], [place] 1-800-853-5163 or 336-641-4981   °Substance Abuse Resources °Organization         Address  Phone  Notes  °[facility]  336-882-2125   °[facility]  336-784-9470   °[facility]  336-285-9073   °[facility]  336-845-3988   °Residential & Outpatient Substance Abuse Program  1-800-659-3381   °Psychological Services °Organization         Address  Phone  Notes  °[facility]  336- 832-9600   °[facility]  336- 378-7881   °[facility] [address], [place] 1-800-853-5163 or 336-641-4981   ° °Mobile Crisis Teams °Organization         Address  Phone  Notes  °[facility], Mobile Crisis Care Unit  1-877-626-1772   °Assertive °[facility] ° [address]  [place], [place] 336-834-9664   °Sharon DeEsch [address] °[place] 336-554-5454   ° °Self-Help/Support Groups °Organization         Address  Phone             Notes  °[facility] [place] - variety of support groups  336- 373-1402 Call for more information  °Narcotics Anonymous (NA), [facility] [address], °[place]  2 meetings at this location  ° °  Residential Treatment Programs Organization         Address  Phone  Notes  [facility] [address],    [place]  1-325-162-6507   [facility]  [address], [place] [address], [place], [place]   [place], [place] [phone] Admissions: 8am-3pm M-F  [facility] [address],    [place], [place] 166-060-0459   [facility] [address] [place], [place], [place]   [facility] [address]  [place], [place]   [facility] Intensive Outpatient [address], [place], [place]   [place] [address]  [place], [place] 1-639-154-3959 or (425)877-8671   [facility] [address] [place], [place] Accepts Medicaid  [facility] [address],  [place] 1-(561) 216-6395 Substance Abuse/Addiction Treatment   [facility]         Address  Phone  Notes  [facility]  715-448-3166   Domenic Schwab, PhD [address] [place], [place]   9087120701 or 763-822-2739   [facility] [address] [place], [place] (807)643-8218   [facility] [address] [place], [place] 437-023-0950 Insurance/Medicaid/sponsorship through [facility] and Families [address]                                    [place], [place] [phone] [place] [address], [place] [phone]    Dr. Adele Schilder  [phone]   [facility] Dept. 1) [address], [place] 2) [place] 3)  [address], [place] [phone]) [phone]  [phone]   [place] [phone] or [phone] (After Hours)       Take the prescription as directed.  Increase your fluid intake (ie:  Gatoraide) for the next few days, as discussed.  Eat a bland diet and advance to your regular diet slowly as you can tolerate it.  Call your regular medical doctor today to schedule a follow up appointment in the next 2 days.  Return to the Emergency Department immediately if not improving (or even worsening) despite taking the medicines as prescribed, any black or bloody stool or vomit, if you develop a fever over "101," or for any other concerns.

## 2014-11-06 NOTE — ED Provider Notes (Signed)
CSN: [id number]     Arrival date & time 11/05/14  2309 History   First MD Initiated Contact with Patient 11/05/14 2358     Chief Complaint  Patient presents with  . Emesis      HPI Pt was seen at [facility]. Per pt, c/o gradual onset and persistence of multiple intermittent episodes of N/V that began 1 hour PTA. Pt states the N/V began approximately 1 hour after she "took a new muscle relaxer" prescribed by her PMD for the first time, and "ate some old squash in the refrigerator." The symptoms have been associated with no other complaints. Denies diarrhea, no abd pain, no CP/palpitations, no cough/SOB, no back pain, no black or blood in stools or emesis, no fevers, no rash.    Past Medical History  Diagnosis Date  . Essential hypertension, benign   . Hyperlipidemia   . Diabetes mellitus type II     A1c of 7.2 in 07/2012  . Microcytic anemia   . Lower extremity edema   . Bradycardia   . Aortic valve sclerosis   . Degenerative joint disease   . Palpitations   . Chronic kidney disease, stage III (moderate)     Creatinine of 1.49 in 07/2012  . DOE (dyspnea on exertion)     chronic   Past Surgical History  Procedure Laterality Date  . Tubal ligation    . Hemorroidectomy    . Tonsillectomy    . Colonoscopy w/ polypectomy  2005    Dr. Tamela Oddi resected per patient  . Esophagogastroduodenoscopy      remote, foreign body extraction  . Colonoscopy  08/28/2011    sigmoid colon serated adenoma, next colonoscopy 08/2016  . Esophagogastroduodenoscopy  08/28/2011    hiatal hernia, erosion on fold of diaphragmatic hiatus (cameron lesion), antral and prepylori erosions, SB bx negative, gastric bx showed reactive gastropathy but no H.Pylori   Family History  Problem Relation Age of Onset  . Colon cancer Neg Hx   . Coronary artery disease Sister   . Heart attack Brother     Deceased  . Liver disease Neg Hx   . Hypertension Father    History  Substance Use Topics  . Smoking status: Never  Smoker   . Smokeless tobacco: Not on file  . Alcohol Use: No    Review of Systems ROS: Statement: All systems negative except as marked or noted in the HPI; Constitutional: Negative for fever and chills. ; ; Eyes: Negative for eye pain, redness and discharge. ; ; ENMT: Negative for ear pain, hoarseness, nasal congestion, sinus pressure and sore throat. ; ; Cardiovascular: Negative for chest pain, palpitations, diaphoresis, dyspnea and peripheral edema. ; ; Respiratory: Negative for cough, wheezing and stridor. ; ; Gastrointestinal: +N/V. Negative for diarrhea, abdominal pain, blood in stool, hematemesis, jaundice and rectal bleeding. . ; ; Genitourinary: Negative for dysuria, flank pain and hematuria. ; ; Musculoskeletal: Negative for back pain and neck pain. Negative for swelling and trauma.; ; Skin: Negative for pruritus, rash, abrasions, blisters, bruising and skin lesion.; ; Neuro: Negative for headache, lightheadedness and neck stiffness. Negative for weakness, altered level of consciousness , altered mental status, extremity weakness, paresthesias, involuntary movement, seizure and syncope.      Allergies  Review of patient's allergies indicates no known allergies.  Home Medications   Prior to Admission medications   Medication Sig Start Date End Date Taking? Authorizing Provider  ACCU-CHEK AVIVA PLUS test strip 1 each by Other route as needed.  06/25/12  Yes Historical Provider, MD  amLODipine-valsartan (EXFORGE) 10-320 MG per tablet Take 1 tablet by mouth daily. 10/29/14  Yes Lendon Colonel, NP  aspirin 81 MG tablet Take 81 mg by mouth daily.     Yes Historical Provider, MD  baclofen (LIORESAL) 10 MG tablet Take 10 mg by mouth 2 (two) times daily as needed for muscle spasms.   Yes Historical Provider, MD  Cholecalciferol (VITAMIN D) 400 UNITS capsule Take 400 Units by mouth daily.   Yes Historical Provider, MD  cloNIDine (CATAPRES - DOSED IN MG/24 HR) 0.3 mg/24hr patch APPLY 1 PATCH  ONCE WEEKLY. 10/29/14  Yes Lendon Colonel, NP  eplerenone (INSPRA) 25 MG tablet Take 25 mg by mouth daily.   Yes Historical Provider, MD  GLIPIZIDE XL 5 MG 24 hr tablet  10/09/13  Yes Historical Provider, MD  JENTADUETO 2.5-500 MG TABS Take 1 tablet by mouth 2 (two) times daily.  08/06/12  Yes Historical Provider, MD  Multiple Vitamin (MULTIVITAMIN) capsule Take 1 capsule by mouth daily.     Yes Historical Provider, MD  potassium chloride SA (K-DUR,KLOR-CON) 20 MEQ tablet Take 1 tablet (20 mEq total) by mouth daily. 10/29/14  Yes Lendon Colonel, NP  simvastatin (ZOCOR) 20 MG tablet Take 1 tablet (20 mg total) by mouth at bedtime. 10/29/14  Yes Lendon Colonel, NP   BP 183/59 mmHg  Pulse 97  Temp(Src) 98.3 F (36.8 C) (Oral)  Resp 18  SpO2 96% Physical Exam  0020: Physical examination:  Nursing notes reviewed; Vital signs and O2 SAT reviewed;  Constitutional: Well developed, Well nourished, Well hydrated, In no acute distress; Head:  Normocephalic, atraumatic; Eyes: EOMI, PERRL, No scleral icterus; ENMT: Mouth and pharynx normal, Mucous membranes moist; Neck: Supple, Full range of motion, No lymphadenopathy; Cardiovascular: Regular rate and rhythm, No gallop; Respiratory: Breath sounds clear & equal bilaterally, No wheezes.  Speaking full sentences with ease, Normal respiratory effort/excursion; Chest: Nontender, Movement normal; Abdomen: Soft, Nontender, Nondistended, Normal bowel sounds; Genitourinary: No CVA tenderness; Spine:  No midline CS, TS, LS tenderness. +TTP right hypertonic trapezius muscle. No rash.;; Extremities: Pulses normal, No tenderness, No edema, No calf edema or asymmetry.; Neuro: AA&Ox3, Major CN grossly intact.  Speech clear. No gross focal motor or sensory deficits in extremities.; Skin: Color normal, Warm, Dry.    ED Course  Procedures     EKG Interpretation None      MDM  MDM Reviewed: previous chart, nursing note and vitals Reviewed previous:  labs Interpretation: labs and x-ray   Results for orders placed or performed during the hospital encounter of 11/05/14  Comprehensive metabolic panel  Result Value Ref Range   Sodium 142 137 - 147 mEq/L   Potassium 3.8 3.7 - 5.3 mEq/L   Chloride 100 96 - 112 mEq/L   CO2 28 19 - 32 mEq/L   Glucose, Bld 182 (H) 70 - 99 mg/dL   BUN 28 (H) 6 - 23 mg/dL   Creatinine, Ser 1.86 (H) 0.50 - 1.10 mg/dL   Calcium 9.8 8.4 - 10.5 mg/dL   Total Protein 8.8 (H) 6.0 - 8.3 g/dL   Albumin 4.0 3.5 - 5.2 g/dL   AST 13 0 - 37 U/L   ALT 11 0 - 35 U/L   Alkaline Phosphatase 91 39 - 117 U/L   Total Bilirubin 0.2 (L) 0.3 - 1.2 mg/dL   GFR calc non Af Amer 25 (L) >90 mL/min   GFR calc Af Amer 29 (L) >  90 mL/min   Anion gap 14 5 - 15  CBC with Differential  Result Value Ref Range   WBC 13.6 (H) 4.0 - 10.5 K/uL   RBC 4.46 3.87 - 5.11 MIL/uL   Hemoglobin 10.7 (L) 12.0 - 15.0 g/dL   HCT 34.1 (L) 36.0 - 46.0 %   MCV 76.5 (L) 78.0 - 100.0 fL   MCH 24.0 (L) 26.0 - 34.0 pg   MCHC 31.4 30.0 - 36.0 g/dL   RDW 14.5 11.5 - 15.5 %   Platelets 261 150 - 400 K/uL   Neutrophils Relative % 60 43 - 77 %   Neutro Abs 8.2 (H) 1.7 - 7.7 K/uL   Lymphocytes Relative 33 12 - 46 %   Lymphs Abs 4.4 (H) 0.7 - 4.0 K/uL   Monocytes Relative 6 3 - 12 %   Monocytes Absolute 0.8 0.1 - 1.0 K/uL   Eosinophils Relative 1 0 - 5 %   Eosinophils Absolute 0.1 0.0 - 0.7 K/uL   Basophils Relative 0 0 - 1 %   Basophils Absolute 0.0 0.0 - 0.1 K/uL  Lipase, blood  Result Value Ref Range   Lipase 55 11 - 59 U/L  Urinalysis, Routine w reflex microscopic  Result Value Ref Range   Color, Urine YELLOW YELLOW   APPearance CLEAR CLEAR   Specific Gravity, Urine 1.020 1.005 - 1.030   pH 5.5 5.0 - 8.0   Glucose, UA NEGATIVE NEGATIVE mg/dL   Hgb urine dipstick NEGATIVE NEGATIVE   Bilirubin Urine NEGATIVE NEGATIVE   Ketones, ur NEGATIVE NEGATIVE mg/dL   Protein, ur 30 (A) NEGATIVE mg/dL   Urobilinogen, UA 0.2 0.0 - 1.0 mg/dL   Nitrite  NEGATIVE NEGATIVE   Leukocytes, UA NEGATIVE NEGATIVE  Urine microscopic-add on  Result Value Ref Range   Squamous Epithelial / LPF RARE RARE   WBC, UA 0-2 <3 WBC/hpf   RBC / HPF 0-2 <3 RBC/hpf   Bacteria, UA RARE RARE  CBG monitoring, ED  Result Value Ref Range   Glucose-Capillary 171 (H) 70 - 99 mg/dL   Dg Abd Acute W/chest 11/06/2014   CLINICAL DATA:  Spasms in the neck.  Vomiting.  Nauseous.  EXAM: ACUTE ABDOMEN SERIES (ABDOMEN 2 VIEW & CHEST 1 VIEW)  COMPARISON:  10/20/2007 chest x-ray  FINDINGS: There is no evidence of dilated bowel loops or free intraperitoneal air. No radiopaque calculi or other significant radiographic abnormality is seen. Heart size and mediastinal contours are within normal limits. There is elevation of the left diaphragm. There is left basilar atelectasis.  There is lower lumbar spine spondylosis. There is peripheral vascular atherosclerotic disease.  IMPRESSION: Negative abdominal radiographs.  No acute cardiopulmonary disease.   Electronically Signed   By: Kathreen Devoid   On: 11/06/2014 01:32    1962:  Pt's Cr mildly elevated from her baseline; IVF given. H/H per her baseline. Pt vomited x1 after 1st dose of zofran, but was able to tol PO well after 2nd dose. Pt has slept most of her ED visit. VS remain stable, abd benign. Pt continues to deny any further complaints, including abd pain or CP. Pt has not stooled while in the ED. Pt has ambulated with steady gait, easy resps, NAD. Pt offered admission. Pt declines, stating she "wants to go home now" and her family would like to take her home; requesting "a dose of zofran" to take home because she will be unable to get to the pharmacy until later this morning. Dx and testing  d/w pt and family.  Questions answered.  Verb understanding, agreeable to d/c home with outpt f/u.  0630:  As pt started to get dressed, she began to vomit again. Abd remains benign. Pt denies any complaints other than "I just get nauseated and then  throw up." Will re-dose IV zofran, continue IVF and observation admit. Pt and family are now agreeable with this plan.  T/C to [facility] Dr. Shanon Brow, case discussed, including:  HPI, pertinent PM/SHx, VS/PE, dx testing, ED course and treatment:  Agreeable to admit, requests to write temporary orders, obtain observation medical bed to team APAdmits.   Francine Graven, DO 11/08/14 [phone]

## 2014-11-06 NOTE — ED Notes (Signed)
Pt. Sitting in chair. Pt. Changing into clothes. Pt. Vomiting. Dr. Thurnell Garbe notified.

## 2014-11-06 NOTE — H&P (Signed)
[facility] History and Physical  Stephanie BOCHICCHIO RDE:[id number] DOB: April 07, 1941 DOA: 11/05/2014  Referring physician: ED physician, Dr. Thurnell Garbe PCP: Delphina Cahill, MD   Chief Complaint: nausea and vomiting  HPI: [name] is a 73 y.o. female with a history of type 2 diabetes mellitus, aortic valve sclerosis, osteoarthritis, and hypertension, who presents to the emergency department with a chief complaint of nausea and vomiting. Last night at approximately 10:00 PM, she started having intractable nausea and vomiting. Since that time, she has vomited 8-10 times. Her vomitus consists of clear liquid and undigested food from yesterday afternoon. She denies any coffee grounds emesis or bright red blood in her emesis. She denies abdominal pain, diarrhea, black tarry stools, pain with urination, fever, chills, or swelling in her legs. Approximately 1 hour prior to her symptoms, she took baclofen for neck spasms which is a relatively new medication for her. She also reports that she ate "old squash" from her refrigerator several hours prior to her symptoms. She reports that she ate out at a restaurant yesterday and day before.  In the ED, she is afebrile, mildly hypertensive, and otherwise seemed dynamically stable. Her acute abdominal series reveals no acute abnormalities. Her urinalysis is negative. Her lipase and liver transaminases are within normal limits. Her creatinine is elevated at 1.86, elevated W BC of 13.6, and low hemoglobin of 10.7. Her capillary blood glucose is 171. She is being admitted for further evaluation and management.    Review of Systems:  Positive as above in history present illness. In addition, she has been having neck pain with neck spasms. Otherwise review of systems is negative.  Past Medical History  Diagnosis Date  . Essential hypertension, benign   . Hyperlipidemia   . Diabetes mellitus type II     A1c of 7.2 in 07/2012  . Microcytic anemia   . Lower  extremity edema   . Bradycardia   . Aortic valve sclerosis   . Degenerative joint disease   . Palpitations   . Chronic kidney disease, stage III (moderate)     Creatinine of 1.49 in 07/2012  . DOE (dyspnea on exertion)     chronic   Past Surgical History  Procedure Laterality Date  . Tubal ligation    . Hemorroidectomy    . Tonsillectomy    . Colonoscopy w/ polypectomy  2005    Dr. Tamela Oddi resected per patient  . Esophagogastroduodenoscopy      remote, foreign body extraction  . Colonoscopy  08/28/2011    sigmoid colon serated adenoma, next colonoscopy 08/2016  . Esophagogastroduodenoscopy  08/28/2011    hiatal hernia, erosion on fold of diaphragmatic hiatus (cameron lesion), antral and prepylori erosions, SB bx negative, gastric bx showed reactive gastropathy but no H.Pylori   Social History: she is single. She has 2 children. One of her sons and grandson live with her. She denies tobacco, alcohol, and illicit drug use.   No Known Allergies  Family History  Problem Relation Age of Onset  . Colon cancer Neg Hx   . Coronary artery disease Sister   . Heart attack Brother     Deceased  . Liver disease Neg Hx   . Hypertension Father      Prior to Admission medications   Medication Sig Start Date End Date Taking? Authorizing Provider  ACCU-CHEK AVIVA PLUS test strip 1 each by Other route as needed.  06/25/12  Yes Historical Provider, MD  amLODipine-valsartan (EXFORGE) 10-320 MG per tablet Take 1  tablet by mouth daily. 10/29/14  Yes Lendon Colonel, NP  aspirin 81 MG tablet Take 81 mg by mouth daily.     Yes Historical Provider, MD  baclofen (LIORESAL) 10 MG tablet Take 10 mg by mouth 2 (two) times daily as needed for muscle spasms.   Yes Historical Provider, MD  Cholecalciferol (VITAMIN D) 400 UNITS capsule Take 400 Units by mouth daily.   Yes Historical Provider, MD  cloNIDine (CATAPRES - DOSED IN MG/24 HR) 0.3 mg/24hr patch APPLY 1 PATCH ONCE WEEKLY. 10/29/14  Yes Lendon Colonel, NP  eplerenone (INSPRA) 25 MG tablet Take 25 mg by mouth daily.   Yes Historical Provider, MD  GLIPIZIDE XL 5 MG 24 hr tablet  10/09/13  Yes Historical Provider, MD  JENTADUETO 2.5-500 MG TABS Take 1 tablet by mouth 2 (two) times daily.  08/06/12  Yes Historical Provider, MD  Multiple Vitamin (MULTIVITAMIN) capsule Take 1 capsule by mouth daily.     Yes Historical Provider, MD  potassium chloride SA (K-DUR,KLOR-CON) 20 MEQ tablet Take 1 tablet (20 mEq total) by mouth daily. 10/29/14  Yes Lendon Colonel, NP  simvastatin (ZOCOR) 20 MG tablet Take 1 tablet (20 mg total) by mouth at bedtime. 10/29/14  Yes Lendon Colonel, NP  ondansetron (ZOFRAN ODT) 4 MG disintegrating tablet Take 1 tablet (4 mg total) by mouth every 8 (eight) hours as needed for nausea or vomiting. 11/06/14   Francine Graven, DO   Physical Exam: Filed Vitals:   11/05/14 2326 11/06/14 0618  BP: 183/59 161/75  Pulse: 97 73  Temp: 98.3 F (36.8 C)   TempSrc: Oral   Resp: 18 15  SpO2: 96% 93%    Wt Readings from Last 3 Encounters:  10/29/14 82.555 kg (182 lb)  10/27/13 83.462 kg (184 lb)  10/22/12 78.944 kg (174 lb 0.6 oz)    General:  Appears calm and comfortable; pleasant obese 73 year old woman in no acute distress. Eyes: PERRL, normal lids, irises & conjunctiva;; conjunctiva and sclerae are without injection or icterus. ENT: grossly normal hearing; oropharynx mucous membranes are mildly dry; set of dentures noted. No posterior exudates or erythema. Neck: no LAD, masses or thyromegaly Cardiovascular: S1, S2, with a soft systolic murmur. Lower extremities without edema. Telemetry: not applicable  Respiratory: CTA bilaterally, no w/r/r. Normal respiratory effort. Abdomen:obese, positive bowel sounds, soft, ntnd; no masses palpated. Skin: no rash or induration seen on limited exam Musculoskeletal: grossly normal tone BUE/BLE Psychiatric: grossly normal mood and affect, speech fluent and  appropriate Neurologic: cranial nerves II through 12 are intact. Strength is globally 5 over 5 throughout. Sensation is intact to soft touch.          Labs on Admission:  Basic Metabolic Panel:  Recent Labs Lab 11/06/14 0012  NA 142  K 3.8  CL 100  CO2 28  GLUCOSE 182*  BUN 28*  CREATININE 1.86*  CALCIUM 9.8   Liver Function Tests:  Recent Labs Lab 11/06/14 0012  AST 13  ALT 11  ALKPHOS 91  BILITOT 0.2*  PROT 8.8*  ALBUMIN 4.0    Recent Labs Lab 11/06/14 0012  LIPASE 55   No results for input(s): AMMONIA in the last 168 hours. CBC:  Recent Labs Lab 11/06/14 0012  WBC 13.6*  NEUTROABS 8.2*  HGB 10.7*  HCT 34.1*  MCV 76.5*  PLT 261   Cardiac Enzymes: No results for input(s): CKTOTAL, CKMB, CKMBINDEX, TROPONINI in the last 168 hours.  BNP (last 3 results)  No results for input(s): PROBNP in the last 8760 hours. CBG:  Recent Labs Lab 11/06/14 0055  GLUCAP 171*    Radiological Exams on Admission: Dg Abd Acute W/chest  11/06/2014   CLINICAL DATA:  Spasms in the neck.  Vomiting.  Nauseous.  EXAM: ACUTE ABDOMEN SERIES (ABDOMEN 2 VIEW & CHEST 1 VIEW)  COMPARISON:  10/20/2007 chest x-ray  FINDINGS: There is no evidence of dilated bowel loops or free intraperitoneal air. No radiopaque calculi or other significant radiographic abnormality is seen. Heart size and mediastinal contours are within normal limits. There is elevation of the left diaphragm. There is left basilar atelectasis.  There is lower lumbar spine spondylosis. There is peripheral vascular atherosclerotic disease.  IMPRESSION: Negative abdominal radiographs.  No acute cardiopulmonary disease.   Electronically Signed   By: Kathreen Devoid   On: 11/06/2014 01:32    EKG: Independently reviewed.  Assessment/Plan Principal Problem:   Intractable nausea and vomiting Active Problems:   Type II diabetes mellitus with nephropathy   Chronic kidney disease (CKD), stage III (moderate)   Essential  hypertension   Obesity   Microcytic anemia   1. The patient is a 73 year old woman with hypertension, chronic disease, and diabetes mellitus, who presents with intractable nausea and vomiting. Her renal function appears to be at baseline. She does not have metabolic acidosis, so DKA is less likely. Her symptomatology may be secondary to an acute gastroenteritis, but she has no diarrhea. Will also consider medication side effect from baclofen. No evidence of small bowel obstruction on acute abdominal series. She has no abdominal pain. She has known history of colon polyps and gastritis, but she denies hematemesis or melena. Her leukocytosis is likely reactive. Her lipase and liver transaminases are within normal limits.   Plan: 1. We'll make the patient virtually nothing by mouth. 2. Will start IV Protonix and scheduled IV Zofran. 3. Will provide IV fluid hydration. 4. Will treat her diabetes with sliding scale NovoLog.     Code Status: full code DVT Prophylaxis:subcutaneous heparin Family Communication:discussed with patient; family not available Disposition Plan: anticipate discharge to home in the next 24 hours.  Time spent:one hour.  [facility] Pager [phone]

## 2014-11-06 NOTE — ED Notes (Signed)
Pt taking sips of coke. Pt not complaining of nausea at this time.

## 2014-11-07 DIAGNOSIS — K529 Noninfective gastroenteritis and colitis, unspecified: Secondary | ICD-10-CM

## 2014-11-07 LAB — BASIC METABOLIC PANEL
ANION GAP: 9 (ref 5–15)
BUN: 19 mg/dL (ref 6–23)
CO2: 28 mEq/L (ref 19–32)
Calcium: 8.8 mg/dL (ref 8.4–10.5)
Chloride: 109 mEq/L (ref 96–112)
Creatinine, Ser: 1.55 mg/dL — ABNORMAL HIGH (ref 0.50–1.10)
GFR, EST AFRICAN AMERICAN: 37 mL/min — AB (ref >90)
GFR, EST NON AFRICAN AMERICAN: 32 mL/min — AB (ref >90)
Glucose, Bld: 78 mg/dL (ref 70–99)
Potassium: 4.5 mEq/L (ref 3.7–5.3)
SODIUM: 146 meq/L (ref 137–147)

## 2014-11-07 LAB — GLUCOSE, CAPILLARY
GLUCOSE-CAPILLARY: 75 mg/dL (ref 70–99)
Glucose-Capillary: 83 mg/dL (ref 70–99)
Glucose-Capillary: 85 mg/dL (ref 70–99)

## 2014-11-07 LAB — CBC
HCT: 28.8 % — ABNORMAL LOW (ref 36.0–46.0)
Hemoglobin: 8.8 g/dL — ABNORMAL LOW (ref 12.0–15.0)
MCH: 23.9 pg — AB (ref 26.0–34.0)
MCHC: 30.6 g/dL (ref 30.0–36.0)
MCV: 78.3 fL (ref 78.0–100.0)
PLATELETS: 231 10*3/uL (ref 150–400)
RBC: 3.68 MIL/uL — ABNORMAL LOW (ref 3.87–5.11)
RDW: 14.8 % (ref 11.5–15.5)
WBC: 7 10*3/uL (ref 4.0–10.5)

## 2014-11-07 MED ORDER — ONDANSETRON 4 MG PO TBDP: 4.0000 mg | ORAL_TABLET | Freq: Three times a day (TID) | ORAL | Status: DC | PRN

## 2014-11-07 MED ORDER — FAMOTIDINE 10 MG PO TABS: 10.0000 mg | ORAL_TABLET | Freq: Two times a day (BID) | ORAL | Status: DC

## 2014-11-07 MED ORDER — GLIPIZIDE ER 5 MG PO TB24: 5.0000 mg | ORAL_TABLET | Freq: Every day | ORAL | Status: DC

## 2014-11-07 MED ORDER — ACETAMINOPHEN 325 MG PO TABS
650.0000 mg | ORAL_TABLET | Freq: Three times a day (TID) | ORAL | Status: DC
  Administered 2014-11-07: 650 mg via ORAL
  Filled 2014-11-07: qty 2

## 2014-11-07 MED ORDER — LINAGLIPTIN-METFORMIN HCL 2.5-500 MG PO TABS: 1.0000 | ORAL_TABLET | Freq: Two times a day (BID) | ORAL | Status: DC

## 2014-11-07 NOTE — Progress Notes (Signed)
UR completed 

## 2014-11-07 NOTE — Discharge Summary (Signed)
Physician Discharge Summary  Stephanie Hendricks HYQ:[id number] DOB: 01-Sep-1941 DOA: 11/05/2014  PCP: Delphina Cahill, MD  Admit date: 11/05/2014 Discharge date: 11/07/2014  Time spent: 30  minutes  Recommendations for Outpatient Follow-up:  1. Recommend continued monitoring of the patient's hemoglobin/hematocrit.  Discharge Diagnoses:   1. Intractable nausea and vomiting, possibly secondary to acute gastroenteritis; although medication side effect from baclofen was not ruled out. 2. Type 2 diabetes mellitus with nephropathy. 3. Stage III chronic kidney disease. 4. Obesity. 5. Essential hypertension. 6. Microcytic anemia. 7.history of colon polyps, sigmoid colon adenoma, and gastritis per colonoscopy and EGD in the past.   Discharge Condition: improved.  Diet recommendation: carbohydrate modified and heart healthy.  Filed Weights   11/06/14 0816 11/07/14 0502  Weight: 82.555 kg (182 lb) 83.2 kg (183 lb 6.8 oz)    History of present illness:  The patient is a 73 year old woman with a history of type 2 diabetes mellitus, aortic valve sclerosis, osteoarthritis, and hypertension, who presented to the emergency department on 11/06/14 with a chief complaint of nausea and vomiting. Her history is also significant for eating out at a couple of restaurants on a couple of occasions and taking baclofen which was a relatively new medication. In the ED, she was afebrile and hemodynamically stable. She was mildly hypertensive. Her acute abdominal series revealed no acute abnormalities. Her urinalysis was negative. Her lipase and liver transaminases were within normal limits. Her creatinine was 1.86, W BC 13.6, and hemoglobin 10.7. Her capillary blood glucose was 171. She was admitted for further evaluation and management.  Hospital Course:  The patient was started on maintenance IV fluids for hydration. Most of not all of her oral medications were withheld overnight. IV Zofran was scheduled every 6  hours. IV Protonix was scheduled every hour. Her diabetes was treated with sliding scale NovoLog. Her symptoms quickly abated and her antihypertensive medications were restarted for treatment of chronic hypertension. For further evaluation, a number studies were ordered. Her urinalysis revealed no evidence of infection. Her TSH was within normal limits. Her hemoglobin A1c was 6.5.  The patient's diet was advanced which she tolerated well. She had no nausea, vomiting, diarrhea, or abdominal pain during the hospitalization. Her presentation was thought to be secondary to a short-lived gastroenteritis which could've been from food she had eaten at home or from recent restaurants. The etiology also could have been medication induced from baclofen as it was a relatively new medication. She was instructed to stop this medication. She was advised to take extra strength Tylenol or arthritis strength Tylenol for her chronic arthritis pain.  Of note, her capillary blood glucose was on the lower end of normal during the hospitalization. She was instructed to not take her home oral diabetes medications if her blood glucose was less than 110. She voiced understanding.  Procedures: none Consultations: none  Discharge Exam: Filed Vitals:   11/07/14 0926  BP: 142/55  Pulse:   Temp:   Resp:   pulse 61 respiratory rate 16 oxygen saturation 92% on room air  General: pleasant obese 73 year old African-American woman in no acute distress. Cardiovascular: S1, S2, with a soft systolic murmur. Respiratory: clear to auscultation bilaterally. Abdomen: Obese, positive bowel sounds, soft, nontender, nondistended.  Discharge Instructions You were cared for by a hospitalist during your hospital stay. If you have any questions about your discharge medications or the care you received while you were in the hospital after you are discharged, you can call the unit and asked to  speak with the hospitalist on call if the  hospitalist that took care of you is not available. Once you are discharged, your primary care physician will handle any further medical issues. Please note that NO REFILLS for any discharge medications will be authorized once you are discharged, as it is imperative that you return to your primary care physician (or establish a relationship with a primary care physician if you do not have one) for your aftercare needs so that they can reassess your need for medications and monitor your lab values.  Discharge Instructions    Diet - low sodium heart healthy    Complete by:  As directed      Diet Carb Modified    Complete by:  As directed      Increase activity slowly    Complete by:  As directed           Current Discharge Medication List    START taking these medications   Details  famotidine (PEPCID AC) 10 MG tablet Take 1 tablet (10 mg total) by mouth 2 (two) times daily. You can buy this medication over-the-counter. Take for 1 more week.    ondansetron (ZOFRAN ODT) 4 MG disintegrating tablet Take 1 tablet (4 mg total) by mouth every 8 (eight) hours as needed for nausea or vomiting. Qty: 12 tablet, Refills: 0      CONTINUE these medications which have CHANGED   Details  glipiZIDE (GLIPIZIDE XL) 5 MG 24 hr tablet Take 1 tablet (5 mg total) by mouth daily with breakfast. Do not take this medication if your blood sugar is less than 110.    Linagliptin-Metformin HCl (JENTADUETO) 2.5-500 MG TABS Take 1 tablet by mouth 2 (two) times daily. Do not take this medication if your blood sugar is less than 110.      CONTINUE these medications which have NOT CHANGED   Details  amLODipine-valsartan (EXFORGE) 10-320 MG per tablet Take 1 tablet by mouth daily. Qty: 90 tablet, Refills: 3    aspirin 81 MG tablet Take 81 mg by mouth daily.      Cholecalciferol (VITAMIN D) 400 UNITS capsule Take 400 Units by mouth daily.    cloNIDine (CATAPRES - DOSED IN MG/24 HR) 0.3 mg/24hr patch APPLY 1 PATCH  ONCE WEEKLY. Qty: 12 patch, Refills: 3    eplerenone (INSPRA) 25 MG tablet Take 25 mg by mouth daily.    ferrous fumarate (HEMOCYTE - 106 MG FE) 325 (106 FE) MG TABS tablet Take 1 tablet by mouth daily.    simvastatin (ZOCOR) 20 MG tablet Take 1 tablet (20 mg total) by mouth at bedtime. Qty: 90 tablet, Refills: 3      STOP taking these medications     baclofen (LIORESAL) 10 MG tablet        No Known Allergies Follow-up Information    Follow up with Delphina Cahill, MD. Schedule an appointment as soon as possible for a visit in 2 days.   Specialty:  Internal Medicine   Contact information:    [place] [id number] [phone]        The results of significant diagnostics from this hospitalization (including imaging, microbiology, ancillary and laboratory) are listed below for reference.    Significant Diagnostic Studies: Dg Abd Acute W/chest  11/06/2014   CLINICAL DATA:  Spasms in the neck.  Vomiting.  Nauseous.  EXAM: ACUTE ABDOMEN SERIES (ABDOMEN 2 VIEW & CHEST 1 VIEW)  COMPARISON:  10/20/2007 chest x-ray  FINDINGS: There is no evidence of dilated bowel loops or free intraperitoneal air. No radiopaque calculi or other significant radiographic abnormality is seen. Heart size and mediastinal contours are within normal limits. There is elevation of the left diaphragm. There is left basilar atelectasis.  There is lower lumbar spine spondylosis. There is peripheral vascular atherosclerotic disease.  IMPRESSION: Negative abdominal radiographs.  No acute cardiopulmonary disease.   Electronically Signed   By: Kathreen Devoid   On: 11/06/2014 01:32    Microbiology: No results found for this or any previous visit (from the past 240 hour(s)).   Labs: Basic Metabolic Panel:  Recent Labs Lab 11/06/14 0012 11/06/14 0808 11/07/14 0609  NA 142 144 146  K 3.8 4.5 4.5  CL 100 104 109  CO2 28 29 28   GLUCOSE 182* 182* 78  BUN 28* 27* 19  CREATININE 1.86* 1.63* 1.55*   CALCIUM 9.8 9.2 8.8   Liver Function Tests:  Recent Labs Lab 11/06/14 0012  AST 13  ALT 11  ALKPHOS 91  BILITOT 0.2*  PROT 8.8*  ALBUMIN 4.0    Recent Labs Lab 11/06/14 0012  LIPASE 55   No results for input(s): AMMONIA in the last 168 hours. CBC:  Recent Labs Lab 11/06/14 0012 11/07/14 0609  WBC 13.6* 7.0  NEUTROABS 8.2*  --   HGB 10.7* 8.8*  HCT 34.1* 28.8*  MCV 76.5* 78.3  PLT 261 231   Cardiac Enzymes: No results for input(s): CKTOTAL, CKMB, CKMBINDEX, TROPONINI in the last 168 hours. BNP: BNP (last 3 results) No results for input(s): PROBNP in the last 8760 hours. CBG:  Recent Labs Lab 11/06/14 1608 11/06/14 1654 11/07/14 0009 11/07/14 0505 11/07/14 0845  GLUCAP 52* 82 85 83 75       Signed:  Faysal Fenoglio  [facility] 11/07/2014, 2:40 PM

## 2014-11-08 LAB — URINE CULTURE

## 2014-11-09 LAB — GLUCOSE, CAPILLARY
GLUCOSE-CAPILLARY: 186 mg/dL — AB (ref 70–99)
Glucose-Capillary: 161 mg/dL — ABNORMAL HIGH (ref 70–99)

## 2014-11-30 ENCOUNTER — Other Ambulatory Visit (HOSPITAL_COMMUNITY): Payer: Self-pay | Admitting: Internal Medicine

## 2014-11-30 DIAGNOSIS — Z1231 Encounter for screening mammogram for malignant neoplasm of breast: Secondary | ICD-10-CM

## 2014-12-22 ENCOUNTER — Ambulatory Visit (HOSPITAL_COMMUNITY)
Admission: RE | Admit: 2014-12-22 | Discharge: 2014-12-22 | Disposition: A | Discharge status: Home / Self Care | Payer: Medicare Other | Source: Ambulatory Visit | Attending: Internal Medicine | Admitting: Internal Medicine

## 2014-12-22 DIAGNOSIS — Z1231 Encounter for screening mammogram for malignant neoplasm of breast: Secondary | ICD-10-CM | POA: Diagnosis not present

## 2015-01-12 DIAGNOSIS — H3530 Unspecified macular degeneration: Secondary | ICD-10-CM | POA: Diagnosis not present

## 2015-01-12 DIAGNOSIS — H3321 Serous retinal detachment, right eye: Secondary | ICD-10-CM | POA: Diagnosis not present

## 2015-02-11 ENCOUNTER — Encounter: Payer: Self-pay | Admitting: Adult Health

## 2015-02-11 NOTE — Progress Notes (Signed)
  or irritable 3 3 1 3  Afraid - awful might happen 3 3 0 0  Total GAD 7 Score 21 21 4 15  Anxiety Difficulty Very difficult        

## 2015-02-17 ENCOUNTER — Encounter: Payer: Self-pay | Admitting: Adult Health

## 2015-02-17 ENCOUNTER — Ambulatory Visit (INDEPENDENT_AMBULATORY_CARE_PROVIDER_SITE_OTHER): Payer: Medicare Other | Admitting: Adult Health

## 2015-02-17 VITALS — BP 158/56 | HR 62 | Ht 63.0 in | Wt 177.0 lb

## 2015-02-17 DIAGNOSIS — I358 Other nonrheumatic aortic valve disorders: Secondary | ICD-10-CM

## 2015-02-17 DIAGNOSIS — I10 Essential (primary) hypertension: Secondary | ICD-10-CM

## 2015-02-17 NOTE — Patient Instructions (Signed)
Your physician wants you to follow-up in: 6 months with Curt Bears, NP. You will receive a reminder letter in the mail two months in advance. If you don't receive a letter, please call our office to schedule the follow-up appointment.  Your physician recommends that you continue on your current medications as directed. Please refer to the Current Medication list given to you today.  Your physician has requested that you have an echocardiogram. Echocardiography is a painless test that uses sound waves to create images of your heart. It provides your doctor with information about the size and shape of your heart and how well your heart's chambers and valves are working. This procedure takes approximately one hour. There are no restrictions for this procedure.  Thank you for choosing [facility]!

## 2015-02-17 NOTE — Progress Notes (Signed)
Cardiology Office Note   Date:  02/17/2015   ID:  Cyndy, Braver 1941/12/19, MRN [id number]  PCP:  Delphina Cahill, MD  Cardiologist: [place]/  Jory Sims, NP   Chief Complaint  Patient presents with  . Aortic Stenosis  . Bradycardia  . Hypertension      History of Present Illness: Stephanie Hendricks is a 74 y.o. female who presents for ongoing assessment and management of aortic valve disease, bradycardia, and hypertension.  She has New York Heart Association class II dyspnea on exertion.  She was last seen in the office in November of 2015 with complaints of chronic dyspnea on exertion, chest pain, and change in energy.  Echocardiogram was ordered for reevaluation of LV function, and aortic valve disease.  She was to have this completed after the first of the year.   Unfortunately, the patient was seen in the emergency with intractable nausea and vomiting and admitted for evaluation.  She is not found to have bowel obstruction, but was diagnosed with gastroenteritis.  She was continued on her current medications and advanced in her diet.    She comes today without any cardiac complaints.  She is doing well, medically compliant, avoiding salt.     Past Medical History  Diagnosis Date  . Essential hypertension, benign   . Hyperlipidemia   . Diabetes mellitus type II     A1c of 7.2 in 07/2012  . Microcytic anemia   . Lower extremity edema   . Bradycardia   . Aortic valve sclerosis   . Degenerative joint disease   . Palpitations   . Chronic kidney disease, stage III (moderate)     Creatinine of 1.49 in 07/2012  . DOE (dyspnea on exertion)     chronic    Past Surgical History  Procedure Laterality Date  . Tubal ligation    . Hemorroidectomy    . Tonsillectomy    . Colonoscopy w/ polypectomy  2005    Dr. Tamela Oddi resected per patient  . Esophagogastroduodenoscopy      remote, foreign body extraction  . Colonoscopy  08/28/2011    sigmoid colon serated  adenoma, next colonoscopy 08/2016  . Esophagogastroduodenoscopy  08/28/2011    hiatal hernia, erosion on fold of diaphragmatic hiatus (cameron lesion), antral and prepylori erosions, SB bx negative, gastric bx showed reactive gastropathy but no H.Pylori     Current Outpatient Prescriptions  Medication Sig Dispense Refill  . amLODipine-valsartan (EXFORGE) 10-320 MG per tablet Take 1 tablet by mouth daily. 90 tablet 3  . aspirin 81 MG tablet Take 81 mg by mouth daily.      . cetirizine (ZYRTEC) 10 MG tablet Take 10 mg by mouth daily.    . cloNIDine (CATAPRES - DOSED IN MG/24 HR) 0.3 mg/24hr patch APPLY 1 PATCH ONCE WEEKLY. (Patient taking differently: Place 0.3 mg onto the skin once a week. Patient changes on Tuesday) 12 patch 3  . eplerenone (INSPRA) 25 MG tablet Take 50 mg by mouth daily.     . famotidine (PEPCID AC) 10 MG tablet Take 1 tablet (10 mg total) by mouth 2 (two) times daily. You can buy this medication over-the-counter. Take for 1 more week.    . ferrous fumarate (HEMOCYTE - 106 MG FE) 325 (106 FE) MG TABS tablet Take 1 tablet by mouth daily.    Marland Kitchen glipiZIDE (GLIPIZIDE XL) 5 MG 24 hr tablet Take 1 tablet (5 mg total) by mouth daily with breakfast. Do not take this  medication if your blood sugar is less than 110.    . Linagliptin-Metformin HCl (JENTADUETO) 2.5-500 MG TABS Take 1 tablet by mouth 2 (two) times daily. Do not take this medication if your blood sugar is less than 110.    . simvastatin (ZOCOR) 20 MG tablet Take 1 tablet (20 mg total) by mouth at bedtime. 90 tablet 3  . Cholecalciferol (VITAMIN D) 400 UNITS capsule Take 400 Units by mouth daily.     No current facility-administered medications for this visit.    Allergies:   Review of patient's allergies indicates no known allergies.    Social History:  The patient  reports that she has never smoked. She has never used smokeless tobacco. She reports that she does not drink alcohol or use illicit drugs.   Family History:   The patient's family history includes Coronary artery disease in her sister; Heart attack in her brother; Hypertension in her father. There is no history of Colon cancer or Liver disease.    ROS:  Please see the history of present illness.   Otherwise,.  all other systems are reviewed and negative.    PHYSICAL EXAM: VS:  BP 158/56 mmHg  Pulse 62  Ht 5\' 3"  (1.6 m)  Wt 80.287 kg (177 lb)  BMI 31.36 kg/m2  SpO2 94% , BMI Body mass index is 31.36 kg/(m^2). GEN: Well nourished, well developed, in no acute distress HEENT: normal Neck: no JVD, carotid bruits, or masses QJJHERD:EYC;1/4 holosystolic murmur with preserved S2 no murmurs, rubs, or gallops,no edema  Respiratory:  clear to auscultation bilaterally, normal work of breathing GI: soft, nontender, nondistended, + BS MS: no deformity or atrophy Skin: warm and dry, no rash Neuro:  Strength and sensation are intact Psych: euthymic mood, full affect   Recent Labs: 11/06/2014: ALT 11; TSH 2.320 11/07/2014: BUN 19; Creatinine 1.55*; Hemoglobin 8.8*; Platelets 231; Potassium 4.5; Sodium 146    Lipid Panel No results found for: CHOL, TRIG, HDL, CHOLHDL, VLDL, LDLCALC, LDLDIRECT    Wt Readings from Last 3 Encounters:  02/17/15 80.287 kg (177 lb)  11/07/14 83.2 kg (183 lb 6.8 oz)  10/29/14 82.555 kg (182 lb)        ASSESSMENT AND PLAN:  1. Aortic valve stenosis: She is asymptomatic for chest pain, has chronic shortness of breath on exertion.  I will repeat her echocardiogram for further evaluation of aortic valve disease.  If no changes in her measurements or worsening stenosis, will repeat every 2 years.  2. Hypertension; Continue current medication regimen.  Will not make any changes.  She is doing well and stable  3. Hypercholesterolemia: continued management with statin.  Labs are followed by primary care physician, Dr. Nevada Crane.   Current medicines are reviewed at length with the patient today.   Labs/ tests ordered today  include: Echocardiogram  Orders Placed This Encounter  Procedures  . 2D Echocardiogram with contrast     Disposition:   FU with 6 months  Signed, Jory Sims, NP  02/17/2015 [facility] [address], [place], [place]  [address] Phone: [phone]; Fax: [phone]

## 2015-02-17 NOTE — Progress Notes (Deleted)
Name: Stephanie Hendricks    DOB: 1941/04/06  Age: 74 y.o.  MR#: [id number]       PCP:  Delphina Cahill, MD      Insurance: Payor: [employer] MEDICARE / Plan: [facility] MEDICARE / Product Type: *No Product type* /   CC:    Chief Complaint  Patient presents with  . Aortic Stenosis  . Bradycardia  . Hypertension    VS Filed Vitals:   02/17/15 1459  BP: 158/56  Pulse: 62  Height: 5\' 3"  (1.6 m)  Weight: 177 lb (80.287 kg)  SpO2: 94%    Weights Current Weight  02/17/15 177 lb (80.287 kg)  11/07/14 183 lb 6.8 oz (83.2 kg)  10/29/14 182 lb (82.555 kg)    Blood Pressure  BP Readings from Last 3 Encounters:  02/17/15 158/56  11/07/14 130/65  10/29/14 142/58     Admit date:  (Not on file) Last encounter with RMR:  10/29/2014   Allergy Review of patient's allergies indicates no known allergies.  Current Outpatient Prescriptions  Medication Sig Dispense Refill  . amLODipine-valsartan (EXFORGE) 10-320 MG per tablet Take 1 tablet by mouth daily. 90 tablet 3  . aspirin 81 MG tablet Take 81 mg by mouth daily.      . cetirizine (ZYRTEC) 10 MG tablet Take 10 mg by mouth daily.    . cloNIDine (CATAPRES - DOSED IN MG/24 HR) 0.3 mg/24hr patch APPLY 1 PATCH ONCE WEEKLY. (Patient taking differently: Place 0.3 mg onto the skin once a week. Patient changes on Tuesday) 12 patch 3  . eplerenone (INSPRA) 25 MG tablet Take 50 mg by mouth daily.     . famotidine (PEPCID AC) 10 MG tablet Take 1 tablet (10 mg total) by mouth 2 (two) times daily. You can buy this medication over-the-counter. Take for 1 more week.    . ferrous fumarate (HEMOCYTE - 106 MG FE) 325 (106 FE) MG TABS tablet Take 1 tablet by mouth daily.    Marland Kitchen glipiZIDE (GLIPIZIDE XL) 5 MG 24 hr tablet Take 1 tablet (5 mg total) by mouth daily with breakfast. Do not take this medication if your blood sugar is less than 110.    . Linagliptin-Metformin HCl (JENTADUETO) 2.5-500 MG TABS Take 1 tablet by mouth 2 (two) times daily. Do not take this  medication if your blood sugar is less than 110.    . simvastatin (ZOCOR) 20 MG tablet Take 1 tablet (20 mg total) by mouth at bedtime. 90 tablet 3  . Cholecalciferol (VITAMIN D) 400 UNITS capsule Take 400 Units by mouth daily.     No current facility-administered medications for this visit.    Discontinued Meds:    Medications Discontinued During This Encounter  Medication Reason  . ondansetron (ZOFRAN ODT) 4 MG disintegrating tablet Error    Patient Active Problem List   Diagnosis Date Noted  . Acute gastroenteritis 11/07/2014  . Intractable nausea and vomiting 11/06/2014  . Obesity 11/06/2014  . Chronic kidney disease (CKD), stage III (moderate) 11/06/2014  . Microcytic anemia 11/06/2014  . Aortic valve disorders 10/22/2012  . Bradycardia   . Essential hypertension   . Hyperlipidemia   . Type II diabetes mellitus with nephropathy   . Hx of colonic polyps 08/21/2011    LABS    Component Value Date/Time   NA 146 11/07/2014 0609   NA 144 11/06/2014 0808   NA 142 11/06/2014 0012   NA 146 11/13/2011 0919   NA 143 08/10/2011 0859  K 4.5 11/07/2014 0609   K 4.5 11/06/2014 0808   K 3.8 11/06/2014 0012   K 3.9 08/10/2011 0859   CL 109 11/07/2014 0609   CL 104 11/06/2014 0808   CL 100 11/06/2014 0012   CO2 28 11/07/2014 0609   CO2 29 11/06/2014 0808   CO2 28 11/06/2014 0012   GLUCOSE 78 11/07/2014 0609   GLUCOSE 182* 11/06/2014 0808   GLUCOSE 182* 11/06/2014 0012   BUN 19 11/07/2014 0609   BUN 27* 11/06/2014 0808   BUN 28* 11/06/2014 0012   BUN 21 07/15/2013   BUN 28* 11/13/2011 0919   BUN 22* 08/10/2011 0859   CREATININE 1.55* 11/07/2014 0609   CREATININE 1.63* 11/06/2014 0808   CREATININE 1.86* 11/06/2014 0012   CREATININE 1.59 07/15/2013   CREATININE 1.47 11/13/2011 0919   CREATININE 1.36 08/10/2011 0859   CALCIUM 8.8 11/07/2014 0609   CALCIUM 9.2 11/06/2014 0808   CALCIUM 9.8 11/06/2014 0012   CALCIUM 9.9 11/13/2011 0919   CALCIUM 9.8 08/10/2011 0859    GFRNONAA 32* 11/07/2014 0609   GFRNONAA 30* 11/06/2014 0808   GFRNONAA 25* 11/06/2014 0012   GFRAA 37* 11/07/2014 0609   GFRAA 34* 11/06/2014 0808   GFRAA 29* 11/06/2014 0012   CMP     Component Value Date/Time   NA 146 11/07/2014 0609   NA 146 11/13/2011 0919   K 4.5 11/07/2014 0609   K 3.9 08/10/2011 0859   CL 109 11/07/2014 0609   CO2 28 11/07/2014 0609   GLUCOSE 78 11/07/2014 0609   BUN 19 11/07/2014 0609   BUN 21 07/15/2013   CREATININE 1.55* 11/07/2014 0609   CREATININE 1.59 07/15/2013   CALCIUM 8.8 11/07/2014 0609   CALCIUM 9.9 11/13/2011 0919   PROT 8.8* 11/06/2014 0012   ALBUMIN 4.0 11/06/2014 0012   AST 13 11/06/2014 0012   AST 13 11/13/2011 0919   ALT 11 11/06/2014 0012   ALKPHOS 91 11/06/2014 0012   ALKPHOS 48.0 11/13/2011 0919   BILITOT 0.2* 11/06/2014 0012   BILITOT 0.4 11/13/2011 0919   GFRNONAA 32* 11/07/2014 0609   GFRAA 37* 11/07/2014 0609       Component Value Date/Time   WBC 7.0 11/07/2014 0609   WBC 13.6* 11/06/2014 0012   WBC 6.4 03/25/2012 1145   HGB 8.8* 11/07/2014 0609   HGB 10.7* 11/06/2014 0012   HGB 10.0* 03/25/2012 1145   HCT 28.8* 11/07/2014 0609   HCT 34.1* 11/06/2014 0012   HCT 32.7* 03/25/2012 1145   HCT 34 11/13/2011 0923   HCT 31 08/10/2011 0900   MCV 78.3 11/07/2014 0609   MCV 76.5* 11/06/2014 0012   MCV 77.5* 03/25/2012 1145   MCV 77.5 11/13/2011 0923   MCV 77.7 08/10/2011 0900    Lipid Panel  No results found for: CHOL, TRIG, HDL, CHOLHDL, VLDL, LDLCALC, LDLDIRECT  ABG No results found for: PHART, PCO2ART, PO2ART, HCO3, TCO2, ACIDBASEDEF, O2SAT   Lab Results  Component Value Date   TSH 2.320 11/06/2014   BNP (last 3 results) No results for input(s): BNP in the last 8760 hours.  ProBNP (last 3 results) No results for input(s): PROBNP in the last 8760 hours.  Cardiac Panel (last 3 results) No results for input(s): CKTOTAL, CKMB, TROPONINI, RELINDX in the last 72 hours.  Iron/TIBC/Ferritin/ %Sat    Component  Value Date/Time   IRON 51 03/28/2012 1827   TIBC 295 03/28/2012 1827   FERRITIN 126 03/28/2012 1827   FERRITIN 164.0* 11/13/2011 0925   IRONPCTSAT  17* 03/28/2012 1827     EKG Orders placed or performed in visit on 10/29/14  . EKG 12-Lead     Prior Assessment and Plan Problem List as of 02/17/2015      Cardiovascular and Mediastinum   Essential hypertension   Last Assessment & Plan 10/29/2014 Office Visit Written 10/29/2014  4:22 PM by Lendon Colonel, NP    Follow blood pressure. She will be given refills on amlodipine, valsartan, clonidine patch, she is to avoid salty foods, which would help with some lower extremity edema, associated with these medications. She has had labs drawn every 3 months by her primary care physician. Therefore, I will not add any additional labs. We will see her again in one year and month. She is symptomatic.      Aortic valve disorders   Last Assessment & Plan 10/29/2014 Office Visit Written 10/29/2014  4:23 PM by Lendon Colonel, NP    She is scheduled to have an echocardiogram for reevaluation and ongoing assessment of her aortic valve disease. She wishes to wait to have this completed until after the first of the year. This will be scheduled on her discharge from the office. At this time, she is completely asymptomatic. Heart rate is controlled. We will see her again in one year unless echocardiogram revealed significant changes in her aortic valve function.        Digestive   Acute gastroenteritis     Endocrine   Type II diabetes mellitus with nephropathy     Genitourinary   Chronic kidney disease (CKD), stage III (moderate)     Other   Hx of colonic polyps   Last Assessment & Plan 12/04/2011 Office Visit Written 12/04/2011 11:03 AM by Mahala Menghini, PA    Next colonoscopy in September 2017.      Bradycardia   Last Assessment & Plan 10/27/2013 Office Visit Written 10/27/2013  3:04 PM by Lendon Colonel, NP    No significant bradycardia  or symptoms associated. Continue current medications.      Hyperlipidemia   Last Assessment & Plan 08/19/2012 Office Visit Written 08/19/2012 12:31 PM by Yehuda Savannah, MD    Lipid profile was good, especially in the absence of known vascular disease, when last assessed earlier this month.      Intractable nausea and vomiting   Obesity   Microcytic anemia       Imaging: No results found.

## 2015-03-09 DIAGNOSIS — E1122 Type 2 diabetes mellitus with diabetic chronic kidney disease: Secondary | ICD-10-CM | POA: Diagnosis not present

## 2015-03-09 DIAGNOSIS — E782 Mixed hyperlipidemia: Secondary | ICD-10-CM | POA: Diagnosis not present

## 2015-03-11 ENCOUNTER — Ambulatory Visit (HOSPITAL_COMMUNITY): Payer: Medicare Other

## 2015-03-11 DIAGNOSIS — E782 Mixed hyperlipidemia: Secondary | ICD-10-CM | POA: Diagnosis not present

## 2015-03-11 DIAGNOSIS — R809 Proteinuria, unspecified: Secondary | ICD-10-CM | POA: Diagnosis not present

## 2015-03-11 DIAGNOSIS — N183 Chronic kidney disease, stage 3 (moderate): Secondary | ICD-10-CM | POA: Diagnosis not present

## 2015-03-11 DIAGNOSIS — E1122 Type 2 diabetes mellitus with diabetic chronic kidney disease: Secondary | ICD-10-CM | POA: Diagnosis not present

## 2015-03-11 DIAGNOSIS — R252 Cramp and spasm: Secondary | ICD-10-CM | POA: Diagnosis not present

## 2015-03-15 ENCOUNTER — Ambulatory Visit (HOSPITAL_COMMUNITY)
Admission: RE | Admit: 2015-03-15 | Discharge: 2015-03-15 | Disposition: A | Discharge status: Home / Self Care | Payer: Medicare Other | Source: Ambulatory Visit | Attending: Adult Health | Admitting: Adult Health

## 2015-03-15 DIAGNOSIS — I358 Other nonrheumatic aortic valve disorders: Secondary | ICD-10-CM

## 2015-03-15 DIAGNOSIS — I359 Nonrheumatic aortic valve disorder, unspecified: Secondary | ICD-10-CM | POA: Diagnosis not present

## 2015-03-15 DIAGNOSIS — I350 Nonrheumatic aortic (valve) stenosis: Secondary | ICD-10-CM | POA: Insufficient documentation

## 2015-03-15 NOTE — Progress Notes (Signed)
  Echocardiogram 2D Echocardiogram has been performed.  Stephanie Hendricks 03/15/2015, 11:17 AM

## 2015-04-13 DIAGNOSIS — H31421 Serous choroidal detachment, right eye: Secondary | ICD-10-CM | POA: Diagnosis not present

## 2015-04-13 DIAGNOSIS — H31092 Other chorioretinal scars, left eye: Secondary | ICD-10-CM | POA: Diagnosis not present

## 2015-04-13 DIAGNOSIS — H3530 Unspecified macular degeneration: Secondary | ICD-10-CM | POA: Diagnosis not present

## 2015-04-13 DIAGNOSIS — H3321 Serous retinal detachment, right eye: Secondary | ICD-10-CM | POA: Diagnosis not present

## 2015-04-22 DIAGNOSIS — M545 Low back pain: Secondary | ICD-10-CM | POA: Diagnosis not present

## 2015-04-22 DIAGNOSIS — M5415 Radiculopathy, thoracolumbar region: Secondary | ICD-10-CM | POA: Diagnosis not present

## 2015-05-18 DIAGNOSIS — H3530 Unspecified macular degeneration: Secondary | ICD-10-CM | POA: Diagnosis not present

## 2015-05-18 DIAGNOSIS — H3321 Serous retinal detachment, right eye: Secondary | ICD-10-CM | POA: Diagnosis not present

## 2015-06-28 DIAGNOSIS — E782 Mixed hyperlipidemia: Secondary | ICD-10-CM | POA: Diagnosis not present

## 2015-06-28 DIAGNOSIS — E1122 Type 2 diabetes mellitus with diabetic chronic kidney disease: Secondary | ICD-10-CM | POA: Diagnosis not present

## 2015-06-29 DIAGNOSIS — H3530 Unspecified macular degeneration: Secondary | ICD-10-CM | POA: Diagnosis not present

## 2015-06-29 DIAGNOSIS — H3321 Serous retinal detachment, right eye: Secondary | ICD-10-CM | POA: Diagnosis not present

## 2015-06-30 DIAGNOSIS — Z23 Encounter for immunization: Secondary | ICD-10-CM | POA: Diagnosis not present

## 2015-06-30 DIAGNOSIS — N183 Chronic kidney disease, stage 3 (moderate): Secondary | ICD-10-CM | POA: Diagnosis not present

## 2015-06-30 DIAGNOSIS — I10 Essential (primary) hypertension: Secondary | ICD-10-CM | POA: Diagnosis not present

## 2015-06-30 DIAGNOSIS — R809 Proteinuria, unspecified: Secondary | ICD-10-CM | POA: Diagnosis not present

## 2015-06-30 DIAGNOSIS — I060 Rheumatic aortic stenosis: Secondary | ICD-10-CM | POA: Diagnosis not present

## 2015-06-30 DIAGNOSIS — E119 Type 2 diabetes mellitus without complications: Secondary | ICD-10-CM | POA: Diagnosis not present

## 2015-08-08 ENCOUNTER — Other Ambulatory Visit: Payer: Self-pay | Admitting: Adult Health

## 2015-08-16 ENCOUNTER — Encounter: Payer: Self-pay | Admitting: Adult Health

## 2015-08-16 ENCOUNTER — Ambulatory Visit (INDEPENDENT_AMBULATORY_CARE_PROVIDER_SITE_OTHER): Payer: Medicare Other | Admitting: Adult Health

## 2015-08-16 VITALS — BP 142/68 | HR 67 | Ht 63.0 in | Wt 179.0 lb

## 2015-08-16 DIAGNOSIS — I350 Nonrheumatic aortic (valve) stenosis: Secondary | ICD-10-CM | POA: Diagnosis not present

## 2015-08-16 DIAGNOSIS — I10 Essential (primary) hypertension: Secondary | ICD-10-CM | POA: Diagnosis not present

## 2015-08-16 MED ORDER — CLONIDINE HCL 0.3 MG/24HR TD PTWK: MEDICATED_PATCH | TRANSDERMAL | Status: DC

## 2015-08-16 MED ORDER — AMLODIPINE BESYLATE-VALSARTAN 10-320 MG PO TABS: 1.0000 | ORAL_TABLET | Freq: Every day | ORAL | Status: DC

## 2015-08-16 NOTE — Progress Notes (Signed)
Cardiology Office Note   Date:  08/16/2015   ID:  Stephanie Hendricks, Stephanie Hendricks 1941/10/19, MRN [id number]  PCP:  Delphina Cahill, MD  Cardiologist:  [place]/ Jory Sims, NP   Chief Complaint  Patient presents with  . Shortness of Breath  . Aortic Insuffiency      History of Present Illness: Stephanie Hendricks is a 74 y.o. female who presents for ongoing assessment and management of aortic valve disease, bradycardia, and hypertension. She has New York Heart Association class II dyspnea on exertion.  She was last seen in the office on 02/17/2015 and was without complaints. Echocardiogram was repeated to have ongoing evaluation of AoV stenosis.   Left ventricle: The cavity size was normal. There was mild concentric hypertrophy. Systolic function was normal. The estimated ejection fraction was in the range of 60% to 65%. Wall motion was normal; there were no regional wall motion abnormalities. Doppler parameters are consistent with abnormal left ventricular relaxation (grade 1 diastolic dysfunction). Doppler parameters are consistent with high ventricular filling pressure. Ratio of mitral valve peak E velocity to medial annulus E (e&') velocity: 15.77. - Aortic valve: Mildly calcified annulus. Mildly calcified leaflets. Minimal aortic stenosis. Mean gradient (S): 12 mm Hg. - Mitral valve: Mildly calcified annulus. There was mild regurgitation. - Left atrium: The atrium was severely dilated. Volume/bsa, ES (1-plane Simpson&'s, A4C): 40.9 ml/m^2. - Right atrium: The atrium was mildly dilated. - Tricuspid valve: There was mild regurgitation. - Pulmonary arteries: PA peak pressure: 51 mm Hg (S). Moderately elevated pulmonary pressures. - Systemic veins: IVC is at upper normal limits in size. Normal respiratory variation.  She is without complaints today. Has been essentially inactive. She denies chest pain, DOE, or dizziness. She is medically non-compliant.   Past  Medical History  Diagnosis Date  . Essential hypertension, benign   . Hyperlipidemia   . Diabetes mellitus type II     A1c of 7.2 in 07/2012  . Microcytic anemia   . Lower extremity edema   . Bradycardia   . Aortic valve sclerosis   . Degenerative joint disease   . Palpitations   . Chronic kidney disease, stage III (moderate)     Creatinine of 1.49 in 07/2012  . DOE (dyspnea on exertion)     chronic    Past Surgical History  Procedure Laterality Date  . Tubal ligation    . Hemorroidectomy    . Tonsillectomy    . Colonoscopy w/ polypectomy  2005    Dr. Tamela Oddi resected per patient  . Esophagogastroduodenoscopy      remote, foreign body extraction  . Colonoscopy  08/28/2011    sigmoid colon serated adenoma, next colonoscopy 08/2016  . Esophagogastroduodenoscopy  08/28/2011    hiatal hernia, erosion on fold of diaphragmatic hiatus (cameron lesion), antral and prepylori erosions, SB bx negative, gastric bx showed reactive gastropathy but no H.Pylori     Current Outpatient Prescriptions  Medication Sig Dispense Refill  . amLODipine-valsartan (EXFORGE) 10-320 MG per tablet TAKE 1 TABLET BY MOUTH ONCE A DAY. 90 tablet 3  . aspirin 81 MG tablet Take 81 mg by mouth daily.      . cloNIDine (CATAPRES - DOSED IN MG/24 HR) 0.3 mg/24hr patch APPLY 1 PATCH ONCE WEEKLY. (Patient taking differently: Place 0.3 mg onto the skin once a week. Patient changes on Tuesday) 12 patch 3  . eplerenone (INSPRA) 25 MG tablet Take 50 mg by mouth daily.     . ferrous fumarate (HEMOCYTE - 106 MG  FE) 325 (106 FE) MG TABS tablet Take 1 tablet by mouth daily.    Marland Kitchen glipiZIDE (GLIPIZIDE XL) 5 MG 24 hr tablet Take 1 tablet (5 mg total) by mouth daily with breakfast. Do not take this medication if your blood sugar is less than 110.    . Linagliptin-Metformin HCl (JENTADUETO) 2.5-500 MG TABS Take 1 tablet by mouth 2 (two) times daily. Do not take this medication if your blood sugar is less than 110.    . simvastatin  (ZOCOR) 20 MG tablet Take 1 tablet (20 mg total) by mouth at bedtime. 90 tablet 3   No current facility-administered medications for this visit.    Allergies:   Review of patient's allergies indicates no known allergies.    Social History:  The patient  reports that she has never smoked. She has never used smokeless tobacco. She reports that she does not drink alcohol or use illicit drugs.   Family History:  The patient's family history includes Coronary artery disease in her sister; Heart attack in her brother; Hypertension in her father. There is no history of Colon cancer or Liver disease.    ROS: .   All other systems are reviewed and negative.Unless otherwise mentioned in H&P above.   PHYSICAL EXAM: VS:  Pulse 67  Ht 5\' 3"  (1.6 m)  Wt 179 lb (81.194 kg)  BMI 31.72 kg/m2  SpO2 94% , BMI Body mass index is 31.72 kg/(m^2). GEN: Well nourished, well developed, in no acute distress HEENT: normal Neck: no JVD, carotid bruits, or masses Cardiac: RRR; 2/6 systolic murmur with preserved S2. rubs, or gallops,no edema  Respiratory:  Clear to auscultation bilaterally, normal work of breathing GI: soft, nontender, nondistended, + BS MS: no deformity or atrophy Skin: warm and dry, no rash Neuro:  Strength and sensation are intact Psych: euthymic mood, full affect   Recent Labs: 11/06/2014: ALT 11; TSH 2.320 11/07/2014: BUN 19; Creatinine, Ser 1.55*; Hemoglobin 8.8*; Platelets 231; Potassium 4.5; Sodium 146    Lipid Panel No results found for: CHOL, TRIG, HDL, CHOLHDL, VLDL, LDLCALC, LDLDIRECT    Wt Readings from Last 3 Encounters:  08/16/15 179 lb (81.194 kg)  02/17/15 177 lb (80.287 kg)  11/07/14 183 lb 6.8 oz (83.2 kg)      Other studies Reviewed: Additional studies/ records that were reviewed today include: None Review of the above records demonstrates: N/A   ASSESSMENT AND PLAN:  1.  DOE: She denies this now, but has been essentially sedentary. She is advised to  walk daily, 15 minutes each day and then work up to 30 minutes 3 times a week to increase her stamina.   2. Hypertension: Well controlled. She is refilled on antihypertensive medications.   3. AoV stenosis: Will continue to monitor with follow up echo's in 2 years unless she becomes symptomatic,.    Current medicines are reviewed at length with the patient today.    Labs/ tests ordered today include: None No orders of the defined types were placed in this encounter.     Disposition:   FU with 6 months   Signed, Jory Sims, NP  08/16/2015 1:46 PM    [facility] [address] [place], [place] [address] Phone: [phone]; Fax: [phone]

## 2015-08-16 NOTE — Patient Instructions (Signed)
Your physician wants you to follow-up in: 6 months with K.Lawrence, NP. You will receive a reminder letter in the mail two months in advance. If you don't receive a letter, please call our office to schedule the follow-up appointment.  Your physician recommends that you continue on your current medications as directed. Please refer to the Current Medication list given to you today.  Please walk Two times daily for 15 mins each for 2 weeks and then you may increase 30 mins.  Thank you for choosing [facility]!

## 2015-08-16 NOTE — Progress Notes (Signed)
Name: Stephanie Hendricks    DOB: 1941-05-14  Age: 74 y.o.  MR#: [id number]       PCP:  Delphina Cahill, MD      Insurance: Payor: [employer] MEDICARE / Plan: [facility] MEDICARE / Product Type: *No Product type* /   CC:    Chief Complaint  Patient presents with  . Shortness of Breath  . Aortic Insuffiency    VS Filed Vitals:   08/16/15 1342  BP: 142/68  Pulse: 67  Height: 5\' 3"  (1.6 m)  Weight: 179 lb (81.194 kg)  SpO2: 94%    Weights Current Weight  08/16/15 179 lb (81.194 kg)  02/17/15 177 lb (80.287 kg)  11/07/14 183 lb 6.8 oz (83.2 kg)    Blood Pressure  BP Readings from Last 3 Encounters:  08/16/15 142/68  02/17/15 158/56  11/07/14 130/65     Admit date:  (Not on file) Last encounter with RMR:  08/08/2015   Allergy Review of patient's allergies indicates no known allergies.  Current Outpatient Prescriptions  Medication Sig Dispense Refill  . amLODipine-valsartan (EXFORGE) 10-320 MG per tablet TAKE 1 TABLET BY MOUTH ONCE A DAY. 90 tablet 3  . aspirin 81 MG tablet Take 81 mg by mouth daily.      . cloNIDine (CATAPRES - DOSED IN MG/24 HR) 0.3 mg/24hr patch APPLY 1 PATCH ONCE WEEKLY. (Patient taking differently: Place 0.3 mg onto the skin once a week. Patient changes on Tuesday) 12 patch 3  . eplerenone (INSPRA) 25 MG tablet Take 50 mg by mouth daily.     Marland Kitchen glipiZIDE (GLIPIZIDE XL) 5 MG 24 hr tablet Take 1 tablet (5 mg total) by mouth daily with breakfast. Do not take this medication if your blood sugar is less than 110.    . Linagliptin-Metformin HCl (JENTADUETO) 2.5-500 MG TABS Take 1 tablet by mouth 2 (two) times daily. Do not take this medication if your blood sugar is less than 110.    . simvastatin (ZOCOR) 20 MG tablet Take 1 tablet (20 mg total) by mouth at bedtime. 90 tablet 3   No current facility-administered medications for this visit.    Discontinued Meds:    Medications Discontinued During This Encounter  Medication Reason  . famotidine (PEPCID AC) 10 MG  tablet Error  . cetirizine (ZYRTEC) 10 MG tablet Error  . Cholecalciferol (VITAMIN D) 400 UNITS capsule Error  . ferrous fumarate (HEMOCYTE - 106 MG FE) 325 (106 FE) MG TABS tablet Error    Patient Active Problem List   Diagnosis Date Noted  . Acute gastroenteritis 11/07/2014  . Intractable nausea and vomiting 11/06/2014  . Obesity 11/06/2014  . Chronic kidney disease (CKD), stage III (moderate) 11/06/2014  . Microcytic anemia 11/06/2014  . Aortic valve disorders 10/22/2012  . Bradycardia   . Essential hypertension   . Hyperlipidemia   . Type II diabetes mellitus with nephropathy   . Hx of colonic polyps 08/21/2011    LABS    Component Value Date/Time   NA 146 11/07/2014 0609   NA 144 11/06/2014 0808   NA 142 11/06/2014 0012   NA 146 11/13/2011 0919   NA 143 08/10/2011 0859   K 4.5 11/07/2014 0609   K 4.5 11/06/2014 0808   K 3.8 11/06/2014 0012   K 3.9 08/10/2011 0859   CL 109 11/07/2014 0609   CL 104 11/06/2014 0808   CL 100 11/06/2014 0012   CO2 28 11/07/2014 0609   CO2 29 11/06/2014 9417  CO2 28 11/06/2014 0012   GLUCOSE 78 11/07/2014 0609   GLUCOSE 182* 11/06/2014 0808   GLUCOSE 182* 11/06/2014 0012   BUN 19 11/07/2014 0609   BUN 27* 11/06/2014 0808   BUN 28* 11/06/2014 0012   BUN 21 07/15/2013   BUN 28* 11/13/2011 0919   BUN 22* 08/10/2011 0859   CREATININE 1.55* 11/07/2014 0609   CREATININE 1.63* 11/06/2014 0808   CREATININE 1.86* 11/06/2014 0012   CREATININE 1.59 07/15/2013   CREATININE 1.47 11/13/2011 0919   CREATININE 1.36 08/10/2011 0859   CALCIUM 8.8 11/07/2014 0609   CALCIUM 9.2 11/06/2014 0808   CALCIUM 9.8 11/06/2014 0012   CALCIUM 9.9 11/13/2011 0919   CALCIUM 9.8 08/10/2011 0859   GFRNONAA 32* 11/07/2014 0609   GFRNONAA 30* 11/06/2014 0808   GFRNONAA 25* 11/06/2014 0012   GFRAA 37* 11/07/2014 0609   GFRAA 34* 11/06/2014 0808   GFRAA 29* 11/06/2014 0012   CMP     Component Value Date/Time   NA 146 11/07/2014 0609   NA 146  11/13/2011 0919   K 4.5 11/07/2014 0609   K 3.9 08/10/2011 0859   CL 109 11/07/2014 0609   CO2 28 11/07/2014 0609   GLUCOSE 78 11/07/2014 0609   BUN 19 11/07/2014 0609   BUN 21 07/15/2013   CREATININE 1.55* 11/07/2014 0609   CREATININE 1.59 07/15/2013   CALCIUM 8.8 11/07/2014 0609   CALCIUM 9.9 11/13/2011 0919   PROT 8.8* 11/06/2014 0012   ALBUMIN 4.0 11/06/2014 0012   AST 13 11/06/2014 0012   AST 13 11/13/2011 0919   ALT 11 11/06/2014 0012   ALKPHOS 91 11/06/2014 0012   ALKPHOS 48.0 11/13/2011 0919   BILITOT 0.2* 11/06/2014 0012   BILITOT 0.4 11/13/2011 0919   GFRNONAA 32* 11/07/2014 0609   GFRAA 37* 11/07/2014 0609       Component Value Date/Time   WBC 7.0 11/07/2014 0609   WBC 13.6* 11/06/2014 0012   WBC 6.4 03/25/2012 1145   HGB 8.8* 11/07/2014 0609   HGB 10.7* 11/06/2014 0012   HGB 10.0* 03/25/2012 1145   HCT 28.8* 11/07/2014 0609   HCT 34.1* 11/06/2014 0012   HCT 32.7* 03/25/2012 1145   HCT 34 11/13/2011 0923   HCT 31 08/10/2011 0900   MCV 78.3 11/07/2014 0609   MCV 76.5* 11/06/2014 0012   MCV 77.5* 03/25/2012 1145   MCV 77.5 11/13/2011 0923   MCV 77.7 08/10/2011 0900    Lipid Panel  No results found for: CHOL, TRIG, HDL, CHOLHDL, VLDL, LDLCALC, LDLDIRECT  ABG No results found for: PHART, PCO2ART, PO2ART, HCO3, TCO2, ACIDBASEDEF, O2SAT   Lab Results  Component Value Date   TSH 2.320 11/06/2014   BNP (last 3 results) No results for input(s): BNP in the last 8760 hours.  ProBNP (last 3 results) No results for input(s): PROBNP in the last 8760 hours.  Cardiac Panel (last 3 results) No results for input(s): CKTOTAL, CKMB, TROPONINI, RELINDX in the last 72 hours.  Iron/TIBC/Ferritin/ %Sat    Component Value Date/Time   IRON 51 03/28/2012 1827   TIBC 295 03/28/2012 1827   FERRITIN 126 03/28/2012 1827   FERRITIN 164.0* 11/13/2011 0925   IRONPCTSAT 17* 03/28/2012 1827     EKG Orders placed or performed in visit on 10/29/14  . EKG 12-Lead      Prior Assessment and Plan Problem List as of 08/16/2015      Cardiovascular and Mediastinum   Essential hypertension   Last Assessment & Plan 10/29/2014 Office Visit  Written 10/29/2014  4:22 PM by Lendon Colonel, NP    Follow blood pressure. She will be given refills on amlodipine, valsartan, clonidine patch, she is to avoid salty foods, which would help with some lower extremity edema, associated with these medications. She has had labs drawn every 3 months by her primary care physician. Therefore, I will not add any additional labs. We will see her again in one year and month. She is symptomatic.      Aortic valve disorders   Last Assessment & Plan 10/29/2014 Office Visit Written 10/29/2014  4:23 PM by Lendon Colonel, NP    She is scheduled to have an echocardiogram for reevaluation and ongoing assessment of her aortic valve disease. She wishes to wait to have this completed until after the first of the year. This will be scheduled on her discharge from the office. At this time, she is completely asymptomatic. Heart rate is controlled. We will see her again in one year unless echocardiogram revealed significant changes in her aortic valve function.        Digestive   Acute gastroenteritis     Endocrine   Type II diabetes mellitus with nephropathy     Genitourinary   Chronic kidney disease (CKD), stage III (moderate)     Other   Hx of colonic polyps   Last Assessment & Plan 12/04/2011 Office Visit Written 12/04/2011 11:03 AM by Mahala Menghini, PA    Next colonoscopy in September 2017.      Bradycardia   Last Assessment & Plan 10/27/2013 Office Visit Written 10/27/2013  3:04 PM by Lendon Colonel, NP    No significant bradycardia or symptoms associated. Continue current medications.      Hyperlipidemia   Last Assessment & Plan 08/19/2012 Office Visit Written 08/19/2012 12:31 PM by Yehuda Savannah, MD    Lipid profile was good, especially in the absence of known vascular  disease, when last assessed earlier this month.      Intractable nausea and vomiting   Obesity   Microcytic anemia       Imaging: No results found.

## 2015-09-14 DIAGNOSIS — H3321 Serous retinal detachment, right eye: Secondary | ICD-10-CM | POA: Diagnosis not present

## 2015-09-14 DIAGNOSIS — H3530 Unspecified macular degeneration: Secondary | ICD-10-CM | POA: Diagnosis not present

## 2015-09-30 DIAGNOSIS — E119 Type 2 diabetes mellitus without complications: Secondary | ICD-10-CM | POA: Diagnosis not present

## 2015-09-30 DIAGNOSIS — I10 Essential (primary) hypertension: Secondary | ICD-10-CM | POA: Diagnosis not present

## 2015-10-04 DIAGNOSIS — E1122 Type 2 diabetes mellitus with diabetic chronic kidney disease: Secondary | ICD-10-CM | POA: Diagnosis not present

## 2015-10-04 DIAGNOSIS — Z23 Encounter for immunization: Secondary | ICD-10-CM | POA: Diagnosis not present

## 2015-10-04 DIAGNOSIS — R809 Proteinuria, unspecified: Secondary | ICD-10-CM | POA: Diagnosis not present

## 2015-10-04 DIAGNOSIS — N183 Chronic kidney disease, stage 3 (moderate): Secondary | ICD-10-CM | POA: Diagnosis not present

## 2015-10-04 DIAGNOSIS — E782 Mixed hyperlipidemia: Secondary | ICD-10-CM | POA: Diagnosis not present

## 2015-12-07 ENCOUNTER — Encounter: Payer: Self-pay | Admitting: Cardiology

## 2015-12-07 ENCOUNTER — Ambulatory Visit (INDEPENDENT_AMBULATORY_CARE_PROVIDER_SITE_OTHER): Payer: Medicare Other | Admitting: Cardiology

## 2015-12-07 VITALS — BP 161/70 | HR 58 | Ht 63.0 in | Wt 176.0 lb

## 2015-12-07 DIAGNOSIS — I10 Essential (primary) hypertension: Secondary | ICD-10-CM

## 2015-12-07 DIAGNOSIS — Z136 Encounter for screening for cardiovascular disorders: Secondary | ICD-10-CM

## 2015-12-07 DIAGNOSIS — I350 Nonrheumatic aortic (valve) stenosis: Secondary | ICD-10-CM | POA: Diagnosis not present

## 2015-12-07 NOTE — Patient Instructions (Signed)
Your physician wants you to follow-up in: 1 year with Dr. McDowell You will receive a reminder letter in the mail two months in advance. If you don't receive a letter, please call our office to schedule the follow-up appointment.  Your physician recommends that you continue on your current medications as directed. Please refer to the Current Medication list given to you today.  Thank you for choosing [facility]!!   

## 2015-12-07 NOTE — Progress Notes (Signed)
Cardiology Office Note  Date: 12/07/2015   ID: Stephanie Hendricks, DOB 1941/11/17, MRN [id number]  PCP: Wende Neighbors, MD  Primary Cardiologist: Rozann Lesches, MD   Chief Complaint  Patient presents with  . Aortic Stenosis    History of Present Illness: Stephanie Hendricks is a 74 y.o. female presenting for a follow-up visit. I have not seen her since 2013, her most recent evaluation was with Ms. Lawrence NP in February. She is here today with a family member. She does not report any significant exertional chest pain or unusual shortness of breath. We reviewed her medications, and she reports compliance, also regular follow-up with Dr. Nevada Crane.  She had an echocardiogram done in March of this year that showed minimal calcific aortic stenosis, otherwise normal LVEF with grade 1 diastolic dysfunction, mild tricuspid regurgitation with PASP 51 mmHg. She has not noticed any significant functional decline.  ECG today shows sinus rhythm with nonspecific T-wave changes.  Past Medical History  Diagnosis Date  . Essential hypertension, benign   . Hyperlipidemia   . Diabetes mellitus type II   . Microcytic anemia   . Lower extremity edema   . Bradycardia   . Aortic stenosis     Minimal  . Degenerative joint disease   . Palpitations   . Chronic kidney disease, stage III (moderate)     Current Outpatient Prescriptions  Medication Sig Dispense Refill  . amLODipine-valsartan (EXFORGE) 10-320 MG per tablet Take 1 tablet by mouth daily. 90 tablet 3  . aspirin 81 MG tablet Take 81 mg by mouth daily.      . cloNIDine (CATAPRES - DOSED IN MG/24 HR) 0.3 mg/24hr patch APPLY 1 PATCH ONCE WEEKLY. 12 patch 3  . eplerenone (INSPRA) 50 MG tablet Take 50 mg by mouth daily.     Marland Kitchen glipiZIDE (GLIPIZIDE XL) 5 MG 24 hr tablet Take 1 tablet (5 mg total) by mouth daily with breakfast. Do not take this medication if your blood sugar is less than 110.    . Linagliptin-Metformin HCl (JENTADUETO) 2.5-500 MG TABS Take 1  tablet by mouth 2 (two) times daily. Do not take this medication if your blood sugar is less than 110.    . simvastatin (ZOCOR) 20 MG tablet Take 1 tablet (20 mg total) by mouth at bedtime. 90 tablet 3   No current facility-administered medications for this visit.   Allergies:  Review of patient's allergies indicates no known allergies.   Social History: The patient  reports that she has never smoked. She has never used smokeless tobacco. She reports that she does not drink alcohol or use illicit drugs.   ROS:  Please see the history of present illness. Otherwise, complete review of systems is positive for intermittent leg edema.  All other systems are reviewed and negative.   Physical Exam: VS:  BP 161/70 mmHg  Pulse 58  Ht 5\' 3"  (1.6 m)  Wt 176 lb (79.833 kg)  BMI 31.18 kg/m2  SpO2 96%, BMI Body mass index is 31.18 kg/(m^2).  Wt Readings from Last 3 Encounters:  12/07/15 176 lb (79.833 kg)  08/16/15 179 lb (81.194 kg)  02/17/15 177 lb (80.287 kg)    General: Overweight woman, appears comfortable at rest. HEENT: Conjunctiva and lids normal, oropharynx clear. Neck: Supple, no elevated JVP or carotid bruits, no thyromegaly. Lungs: Clear to auscultation, nonlabored breathing at rest. Cardiac: Regular rate and rhythm, no S3, 2/6 systolic murmur, no pericardial rub. Abdomen: Soft, nontender, bowel sounds present,  no guarding or rebound. Extremities: No pitting edema, distal pulses 2+.  ECG: Tracing from 10/29/2014 showed normal sinus rhythm.  Recent Labwork:  February 2015: Potassium 4.5, BUN 19, creatinine 1.5, hemoglobin 8.8, platelets 231  Other Studies Reviewed Today:  Echocardiogram 03/15/2015: Study Conclusions  - Left ventricle: The cavity size was normal. There was mild concentric hypertrophy. Systolic function was normal. The estimated ejection fraction was in the range of 60% to 65%. Wall motion was normal; there were no regional wall motion abnormalities.  Doppler parameters are consistent with abnormal left ventricular relaxation (grade 1 diastolic dysfunction). Doppler parameters are consistent with high ventricular filling pressure. Ratio of mitral valve peak E velocity to medial annulus E (e&') velocity: 15.77. - Aortic valve: Mildly calcified annulus. Mildly calcified leaflets. Minimal aortic stenosis. Mean gradient (S): 12 mm Hg. - Mitral valve: Mildly calcified annulus. There was mild regurgitation. - Left atrium: The atrium was severely dilated. Volume/bsa, ES (1-plane Simpson&'s, A4C): 40.9 ml/m^2. - Right atrium: The atrium was mildly dilated. - Tricuspid valve: There was mild regurgitation. - Pulmonary arteries: PA peak pressure: 51 mm Hg (S). Moderately elevated pulmonary pressures. - Systemic veins: IVC is at upper normal limits in size. Normal respiratory variation.  Assessment and Plan:  1. Minimal calcific aortic stenosis, asymptomatic. We will continue to follow examination and see her back in one year.  2. Essential hypertension, blood pressure elevated today. She reports compliance with clonidine and Exforge. Keep follow-up with Dr. Nevada Crane.  3. CKD, stage 3.  Current medicines were reviewed with the patient today.   Orders Placed This Encounter  Procedures  . EKG 12-Lead    Disposition: FU with me in 1 year.   Signed, Satira Sark, MD, [facility] 12/07/2015 2:33 PM    [facility] [place] [facility] [place], [place], [place] [address] Phone: [phone]; Fax: [phone]

## 2015-12-21 DIAGNOSIS — H3530 Unspecified macular degeneration: Secondary | ICD-10-CM | POA: Diagnosis not present

## 2015-12-21 DIAGNOSIS — H3321 Serous retinal detachment, right eye: Secondary | ICD-10-CM | POA: Diagnosis not present

## 2016-01-20 DIAGNOSIS — E119 Type 2 diabetes mellitus without complications: Secondary | ICD-10-CM | POA: Diagnosis not present

## 2016-01-20 DIAGNOSIS — I10 Essential (primary) hypertension: Secondary | ICD-10-CM | POA: Diagnosis not present

## 2016-01-20 DIAGNOSIS — N183 Chronic kidney disease, stage 3 (moderate): Secondary | ICD-10-CM | POA: Diagnosis not present

## 2016-01-24 DIAGNOSIS — I060 Rheumatic aortic stenosis: Secondary | ICD-10-CM | POA: Diagnosis not present

## 2016-01-24 DIAGNOSIS — I10 Essential (primary) hypertension: Secondary | ICD-10-CM | POA: Diagnosis not present

## 2016-01-24 DIAGNOSIS — E1122 Type 2 diabetes mellitus with diabetic chronic kidney disease: Secondary | ICD-10-CM | POA: Diagnosis not present

## 2016-01-24 DIAGNOSIS — N183 Chronic kidney disease, stage 3 (moderate): Secondary | ICD-10-CM | POA: Diagnosis not present

## 2016-01-24 DIAGNOSIS — R801 Persistent proteinuria, unspecified: Secondary | ICD-10-CM | POA: Diagnosis not present

## 2016-03-21 DIAGNOSIS — H31421 Serous choroidal detachment, right eye: Secondary | ICD-10-CM | POA: Diagnosis not present

## 2016-03-21 DIAGNOSIS — H3321 Serous retinal detachment, right eye: Secondary | ICD-10-CM | POA: Diagnosis not present

## 2016-03-21 DIAGNOSIS — H31092 Other chorioretinal scars, left eye: Secondary | ICD-10-CM | POA: Diagnosis not present

## 2016-03-21 DIAGNOSIS — H3530 Unspecified macular degeneration: Secondary | ICD-10-CM | POA: Diagnosis not present

## 2016-04-05 ENCOUNTER — Other Ambulatory Visit: Payer: Self-pay | Admitting: Adult Health

## 2016-05-18 DIAGNOSIS — E1122 Type 2 diabetes mellitus with diabetic chronic kidney disease: Secondary | ICD-10-CM | POA: Diagnosis not present

## 2016-05-18 DIAGNOSIS — E782 Mixed hyperlipidemia: Secondary | ICD-10-CM | POA: Diagnosis not present

## 2016-05-18 DIAGNOSIS — D509 Iron deficiency anemia, unspecified: Secondary | ICD-10-CM | POA: Diagnosis not present

## 2016-05-24 DIAGNOSIS — E1122 Type 2 diabetes mellitus with diabetic chronic kidney disease: Secondary | ICD-10-CM | POA: Diagnosis not present

## 2016-05-24 DIAGNOSIS — I10 Essential (primary) hypertension: Secondary | ICD-10-CM | POA: Diagnosis not present

## 2016-05-24 DIAGNOSIS — N183 Chronic kidney disease, stage 3 (moderate): Secondary | ICD-10-CM | POA: Diagnosis not present

## 2016-05-24 DIAGNOSIS — E782 Mixed hyperlipidemia: Secondary | ICD-10-CM | POA: Diagnosis not present

## 2016-06-20 DIAGNOSIS — H3530 Unspecified macular degeneration: Secondary | ICD-10-CM | POA: Diagnosis not present

## 2016-06-20 DIAGNOSIS — H31421 Serous choroidal detachment, right eye: Secondary | ICD-10-CM | POA: Diagnosis not present

## 2016-06-20 DIAGNOSIS — H3321 Serous retinal detachment, right eye: Secondary | ICD-10-CM | POA: Diagnosis not present

## 2016-06-20 DIAGNOSIS — H31092 Other chorioretinal scars, left eye: Secondary | ICD-10-CM | POA: Diagnosis not present

## 2016-07-26 ENCOUNTER — Encounter: Payer: Self-pay | Admitting: Internal Medicine

## 2016-09-01 ENCOUNTER — Other Ambulatory Visit: Payer: Self-pay | Admitting: Adult Health

## 2016-09-28 DIAGNOSIS — Z23 Encounter for immunization: Secondary | ICD-10-CM | POA: Diagnosis not present

## 2016-09-28 DIAGNOSIS — I10 Essential (primary) hypertension: Secondary | ICD-10-CM | POA: Diagnosis not present

## 2016-09-28 DIAGNOSIS — E1122 Type 2 diabetes mellitus with diabetic chronic kidney disease: Secondary | ICD-10-CM | POA: Diagnosis not present

## 2016-10-03 DIAGNOSIS — H2513 Age-related nuclear cataract, bilateral: Secondary | ICD-10-CM | POA: Diagnosis not present

## 2016-10-03 DIAGNOSIS — H3530 Unspecified macular degeneration: Secondary | ICD-10-CM | POA: Diagnosis not present

## 2016-10-03 DIAGNOSIS — H3321 Serous retinal detachment, right eye: Secondary | ICD-10-CM | POA: Diagnosis not present

## 2016-10-03 DIAGNOSIS — H31421 Serous choroidal detachment, right eye: Secondary | ICD-10-CM | POA: Diagnosis not present

## 2016-10-03 DIAGNOSIS — H31092 Other chorioretinal scars, left eye: Secondary | ICD-10-CM | POA: Diagnosis not present

## 2016-10-04 DIAGNOSIS — E1122 Type 2 diabetes mellitus with diabetic chronic kidney disease: Secondary | ICD-10-CM | POA: Diagnosis not present

## 2016-10-04 DIAGNOSIS — I10 Essential (primary) hypertension: Secondary | ICD-10-CM | POA: Diagnosis not present

## 2016-10-04 DIAGNOSIS — N183 Chronic kidney disease, stage 3 (moderate): Secondary | ICD-10-CM | POA: Diagnosis not present

## 2016-10-04 DIAGNOSIS — E785 Hyperlipidemia, unspecified: Secondary | ICD-10-CM | POA: Diagnosis not present

## 2016-10-04 DIAGNOSIS — D509 Iron deficiency anemia, unspecified: Secondary | ICD-10-CM | POA: Diagnosis not present

## 2016-10-11 ENCOUNTER — Other Ambulatory Visit (HOSPITAL_COMMUNITY): Payer: Self-pay | Admitting: Internal Medicine

## 2016-10-11 DIAGNOSIS — Z780 Asymptomatic menopausal state: Secondary | ICD-10-CM

## 2016-10-16 ENCOUNTER — Ambulatory Visit (HOSPITAL_COMMUNITY)
Admission: RE | Admit: 2016-10-16 | Discharge: 2016-10-16 | Disposition: A | Discharge status: Home / Self Care | Payer: Medicare Other | Source: Ambulatory Visit | Attending: Internal Medicine | Admitting: Internal Medicine

## 2016-10-16 DIAGNOSIS — Z780 Asymptomatic menopausal state: Secondary | ICD-10-CM | POA: Diagnosis not present

## 2016-11-07 ENCOUNTER — Other Ambulatory Visit: Payer: Self-pay | Admitting: Adult Health

## 2016-11-19 ENCOUNTER — Other Ambulatory Visit: Payer: Self-pay | Admitting: Adult Health

## 2016-12-31 ENCOUNTER — Ambulatory Visit (INDEPENDENT_AMBULATORY_CARE_PROVIDER_SITE_OTHER): Payer: Medicare Other | Admitting: Cardiology

## 2016-12-31 ENCOUNTER — Encounter: Payer: Self-pay | Admitting: Cardiology

## 2016-12-31 VITALS — BP 134/58 | HR 64 | Ht 61.0 in | Wt 180.0 lb

## 2016-12-31 DIAGNOSIS — I10 Essential (primary) hypertension: Secondary | ICD-10-CM

## 2016-12-31 DIAGNOSIS — I350 Nonrheumatic aortic (valve) stenosis: Secondary | ICD-10-CM

## 2016-12-31 DIAGNOSIS — E782 Mixed hyperlipidemia: Secondary | ICD-10-CM | POA: Diagnosis not present

## 2016-12-31 DIAGNOSIS — R001 Bradycardia, unspecified: Secondary | ICD-10-CM | POA: Diagnosis not present

## 2016-12-31 NOTE — Patient Instructions (Signed)
Your physician wants you to follow-up in: 1 year Dr Ferne Reus will receive a reminder letter in the mail two months in advance. If you don't receive a letter, please call our office to schedule the follow-up appointment.   Your physician has requested that you have an echocardiogram in 1 year JUST BEFORE NEXT VISIT. Echocardiography is a painless test that uses sound waves to create images of your heart. It provides your doctor with information about the size and shape of your heart and how well your heart's chambers and valves are working. This procedure takes approximately one hour. There are no restrictions for this procedure.     Your physician recommends that you continue on your current medications as directed. Please refer to the Current Medication list given to you today.     Thank you for choosing [facility] !

## 2016-12-31 NOTE — Progress Notes (Signed)
Cardiology Office Note  Date: 12/31/2016   ID: Stephanie Hendricks, DOB November 14, 1941, MRN [id number]  PCP: Wende Neighbors, MD  Primary Cardiologist: Rozann Lesches, MD   Chief Complaint  Patient presents with  . Aortic Stenosis    History of Present Illness: Stephanie Hendricks is a 76 y.o. female last seen in December 2016. She is here today for a routine follow-up visit. Reports no overall major change in stamina, no exertional chest pain, palpitations, or syncope.  I reviewed her medications which are outlined below. She continues to follow with Dr. Nevada Crane for primary care.  I reviewed her ECG today which shows sinus bradycardia with nonspecific T-wave changes.  Last echocardiogram was in March 2016 at which point she had minimal aortic stenosis, mean gradient 12 mmHg. LVEF was 60-65% with mild diastolic dysfunction.  Past Medical History:  Diagnosis Date  . Aortic stenosis    Minimal  . Bradycardia   . Chronic kidney disease, stage III (moderate)   . Degenerative joint disease   . Diabetes mellitus type II   . Essential hypertension, benign   . Hyperlipidemia   . Lower extremity edema   . Microcytic anemia   . Palpitations     Past Surgical History:  Procedure Laterality Date  . COLONOSCOPY  08/28/2011   sigmoid colon serated adenoma, next colonoscopy 08/2016  . COLONOSCOPY W/ POLYPECTOMY  2005   Dr. Tamela Oddi resected per patient  . ESOPHAGOGASTRODUODENOSCOPY     remote, foreign body extraction  . ESOPHAGOGASTRODUODENOSCOPY  08/28/2011   hiatal hernia, erosion on fold of diaphragmatic hiatus (cameron lesion), antral and prepylori erosions, SB bx negative, gastric bx showed reactive gastropathy but no H.Pylori  . HEMORROIDECTOMY    . TONSILLECTOMY    . TUBAL LIGATION      Current Outpatient Prescriptions  Medication Sig Dispense Refill  . amLODipine-valsartan (EXFORGE) 10-320 MG tablet TAKE 1 TABLET BY MOUTH ONCE A DAY. 90 tablet 0  . aspirin 81 MG tablet Take 81 mg by  mouth daily.      . cloNIDine (CATAPRES - DOSED IN MG/24 HR) 0.3 mg/24hr patch APPLY 1 PATCH ONCE WEEKLY. 12 patch 0  . glipiZIDE (GLIPIZIDE XL) 5 MG 24 hr tablet Take 1 tablet (5 mg total) by mouth daily with breakfast. Do not take this medication if your blood sugar is less than 110.    . Linagliptin-Metformin HCl (JENTADUETO) 2.5-500 MG TABS Take 1 tablet by mouth 2 (two) times daily. Do not take this medication if your blood sugar is less than 110.    . simvastatin (ZOCOR) 20 MG tablet TAKE (1) TABLET BY MOUTH ONCE DAILY AT BEDTIME FOR CHOLESTEROL. 90 tablet 3  . eplerenone (INSPRA) 50 MG tablet Take 50 mg by mouth daily.      No current facility-administered medications for this visit.    Allergies:  Patient has no known allergies.   Social History: The patient  reports that she has never smoked. She has never used smokeless tobacco. She reports that she does not drink alcohol or use drugs.   ROS:  Please see the history of present illness. Otherwise, complete review of systems is positive for arthritic stiffness.  All other systems are reviewed and negative.   Physical Exam: VS:  BP (!) 134/58   Pulse 64   Ht 5\' 1"  (1.549 m)   Wt 180 lb (81.6 kg)   SpO2 94%   BMI 34.01 kg/m , BMI Body mass index is 34.01 kg/m.  Wt Readings from Last 3 Encounters:  12/31/16 180 lb (81.6 kg)  12/07/15 176 lb (79.8 kg)  08/16/15 179 lb (81.2 kg)    General: Overweight woman, appears comfortable at rest. HEENT: Conjunctiva and lids normal, oropharynx clear. Neck: Supple, no elevated JVP or carotid bruits, no thyromegaly. Lungs: Clear to auscultation, nonlabored breathing at rest. Cardiac: Regular rate and rhythm, no S3, [id number] systolic murmur, no pericardial rub. Abdomen: Soft, nontender, bowel sounds present, no guarding or rebound. Extremities: No pitting edema, distal pulses 2+. Skin: Warm and dry. Musculoskeletal: No kyphosis. Neuropsychiatric: Alert and oriented 3, affect  appropriate.  ECG: I personally reviewed the tracing from 12/07/2015 which showed sinus rhythm with nonspecific T-wave flattening.  Recent Labwork:  March 2017: Potassium 3.9, BUN 12, creatinine 1.2  Other Studies Reviewed Today:  Echocardiogram 03/15/2015: Study Conclusions  - Left ventricle: The cavity size was normal. There was mild concentric hypertrophy. Systolic function was normal. The estimated ejection fraction was in the range of 60% to 65%. Wall motion was normal; there were no regional wall motion abnormalities. Doppler parameters are consistent with abnormal left ventricular relaxation (grade 1 diastolic dysfunction). Doppler parameters are consistent with high ventricular filling pressure. Ratio of mitral valve peak E velocity to medial annulus E (e&') velocity: 15.77. - Aortic valve: Mildly calcified annulus. Mildly calcified leaflets. Minimal aortic stenosis. Mean gradient (S): 12 mm Hg. - Mitral valve: Mildly calcified annulus. There was mild regurgitation. - Left atrium: The atrium was severely dilated. Volume/bsa, ES (1-plane Simpson&'s, A4C): 40.9 ml/m^2. - Right atrium: The atrium was mildly dilated. - Tricuspid valve: There was mild regurgitation. - Pulmonary arteries: PA peak pressure: 51 mm Hg (S). Moderately elevated pulmonary pressures. - Systemic veins: IVC is at upper normal limits in size. Normal respiratory variation.  Assessment and Plan:  1. Aortic stenosis, very mild by echocardiogram in 2016. Cardiac murmur is relatively stable and she does not report any major functional change. Anticipate follow-up visit in one year with repeat echocardiogram.  2. Essential hypertension, systolic blood pressure in the 130s today. No changes made to current regimen. She is on Exforge and clonidine.  3. Asymptomatic sinus bradycardia. ECG reviewed.  4. Hyperlipidemia, on Zocor per Dr. Nevada Crane.  Current medicines were reviewed with  the patient today.   Orders Placed This Encounter  Procedures  . EKG 12-Lead  . ECHOCARDIOGRAM COMPLETE    Disposition: Follow-up in one year.  Signed, Satira Sark, MD, [facility] 12/31/2016 11:25 AM    [place] at [facility] [address] [place], [place] [address] Phone: [phone]; Fax: [phone]

## 2017-01-08 ENCOUNTER — Other Ambulatory Visit: Payer: Self-pay | Admitting: Cardiology

## 2017-01-17 DIAGNOSIS — E1122 Type 2 diabetes mellitus with diabetic chronic kidney disease: Secondary | ICD-10-CM | POA: Diagnosis not present

## 2017-01-17 DIAGNOSIS — E782 Mixed hyperlipidemia: Secondary | ICD-10-CM | POA: Diagnosis not present

## 2017-01-23 DIAGNOSIS — E782 Mixed hyperlipidemia: Secondary | ICD-10-CM | POA: Diagnosis not present

## 2017-01-23 DIAGNOSIS — D509 Iron deficiency anemia, unspecified: Secondary | ICD-10-CM | POA: Diagnosis not present

## 2017-01-23 DIAGNOSIS — N183 Chronic kidney disease, stage 3 (moderate): Secondary | ICD-10-CM | POA: Diagnosis not present

## 2017-01-23 DIAGNOSIS — E1122 Type 2 diabetes mellitus with diabetic chronic kidney disease: Secondary | ICD-10-CM | POA: Diagnosis not present

## 2017-01-23 DIAGNOSIS — I10 Essential (primary) hypertension: Secondary | ICD-10-CM | POA: Diagnosis not present

## 2017-01-24 DIAGNOSIS — I214 Non-ST elevation (NSTEMI) myocardial infarction: Secondary | ICD-10-CM

## 2017-01-24 HISTORY — DX: Non-ST elevation (NSTEMI) myocardial infarction: I21.4

## 2017-01-30 DIAGNOSIS — H3530 Unspecified macular degeneration: Secondary | ICD-10-CM | POA: Diagnosis not present

## 2017-01-30 DIAGNOSIS — H3321 Serous retinal detachment, right eye: Secondary | ICD-10-CM | POA: Diagnosis not present

## 2017-01-30 DIAGNOSIS — H31092 Other chorioretinal scars, left eye: Secondary | ICD-10-CM | POA: Diagnosis not present

## 2017-02-08 ENCOUNTER — Other Ambulatory Visit: Payer: Self-pay | Admitting: Cardiology

## 2017-02-11 ENCOUNTER — Other Ambulatory Visit: Payer: Self-pay

## 2017-02-11 MED ORDER — AMLODIPINE BESYLATE-VALSARTAN 10-320 MG PO TABS: 1.0000 | ORAL_TABLET | Freq: Every day | ORAL | 3 refills | Status: DC

## 2017-02-16 ENCOUNTER — Encounter (HOSPITAL_COMMUNITY): Payer: Self-pay

## 2017-02-16 ENCOUNTER — Encounter (HOSPITAL_COMMUNITY)
Admission: EM | Disposition: A | Discharge status: Skilled Nursing Facility | Payer: Self-pay | Source: Home / Self Care | Attending: Cardiothoracic Surgery

## 2017-02-16 ENCOUNTER — Emergency Department (HOSPITAL_COMMUNITY): Payer: Medicare Other

## 2017-02-16 ENCOUNTER — Inpatient Hospital Stay (HOSPITAL_COMMUNITY): Payer: Medicare Other | Admitting: Certified Registered Nurse Anesthetist

## 2017-02-16 ENCOUNTER — Inpatient Hospital Stay (HOSPITAL_COMMUNITY): Payer: Medicare Other

## 2017-02-16 ENCOUNTER — Inpatient Hospital Stay (HOSPITAL_COMMUNITY)
Admission: EM | Admit: 2017-02-16 | Discharge: 2017-03-02 | DRG: 233 | Disposition: A | Discharge status: Skilled Nursing Facility | Payer: Medicare Other | Attending: Cardiothoracic Surgery | Admitting: Cardiothoracic Surgery

## 2017-02-16 DIAGNOSIS — N183 Chronic kidney disease, stage 3 unspecified: Secondary | ICD-10-CM

## 2017-02-16 DIAGNOSIS — I5189 Other ill-defined heart diseases: Secondary | ICD-10-CM

## 2017-02-16 DIAGNOSIS — R570 Cardiogenic shock: Secondary | ICD-10-CM | POA: Diagnosis present

## 2017-02-16 DIAGNOSIS — J9601 Acute respiratory failure with hypoxia: Secondary | ICD-10-CM | POA: Diagnosis not present

## 2017-02-16 DIAGNOSIS — E1122 Type 2 diabetes mellitus with diabetic chronic kidney disease: Secondary | ICD-10-CM | POA: Diagnosis present

## 2017-02-16 DIAGNOSIS — D631 Anemia in chronic kidney disease: Secondary | ICD-10-CM | POA: Diagnosis not present

## 2017-02-16 DIAGNOSIS — I4891 Unspecified atrial fibrillation: Secondary | ICD-10-CM | POA: Diagnosis not present

## 2017-02-16 DIAGNOSIS — I10 Essential (primary) hypertension: Secondary | ICD-10-CM | POA: Diagnosis not present

## 2017-02-16 DIAGNOSIS — I213 ST elevation (STEMI) myocardial infarction of unspecified site: Secondary | ICD-10-CM | POA: Diagnosis not present

## 2017-02-16 DIAGNOSIS — I214 Non-ST elevation (NSTEMI) myocardial infarction: Secondary | ICD-10-CM | POA: Diagnosis not present

## 2017-02-16 DIAGNOSIS — R001 Bradycardia, unspecified: Secondary | ICD-10-CM | POA: Diagnosis present

## 2017-02-16 DIAGNOSIS — D62 Acute posthemorrhagic anemia: Secondary | ICD-10-CM | POA: Diagnosis not present

## 2017-02-16 DIAGNOSIS — I130 Hypertensive heart and chronic kidney disease with heart failure and stage 1 through stage 4 chronic kidney disease, or unspecified chronic kidney disease: Secondary | ICD-10-CM | POA: Diagnosis not present

## 2017-02-16 DIAGNOSIS — I5033 Acute on chronic diastolic (congestive) heart failure: Secondary | ICD-10-CM | POA: Diagnosis present

## 2017-02-16 DIAGNOSIS — I509 Heart failure, unspecified: Secondary | ICD-10-CM | POA: Diagnosis not present

## 2017-02-16 DIAGNOSIS — N179 Acute kidney failure, unspecified: Secondary | ICD-10-CM | POA: Diagnosis not present

## 2017-02-16 DIAGNOSIS — Z09 Encounter for follow-up examination after completed treatment for conditions other than malignant neoplasm: Secondary | ICD-10-CM

## 2017-02-16 DIAGNOSIS — I5032 Chronic diastolic (congestive) heart failure: Secondary | ICD-10-CM | POA: Diagnosis not present

## 2017-02-16 DIAGNOSIS — Z951 Presence of aortocoronary bypass graft: Secondary | ICD-10-CM

## 2017-02-16 DIAGNOSIS — Z7984 Long term (current) use of oral hypoglycemic drugs: Secondary | ICD-10-CM

## 2017-02-16 DIAGNOSIS — I350 Nonrheumatic aortic (valve) stenosis: Secondary | ICD-10-CM | POA: Diagnosis not present

## 2017-02-16 DIAGNOSIS — Z7401 Bed confinement status: Secondary | ICD-10-CM | POA: Diagnosis not present

## 2017-02-16 DIAGNOSIS — I2510 Atherosclerotic heart disease of native coronary artery without angina pectoris: Secondary | ICD-10-CM | POA: Diagnosis present

## 2017-02-16 DIAGNOSIS — E785 Hyperlipidemia, unspecified: Secondary | ICD-10-CM | POA: Diagnosis not present

## 2017-02-16 DIAGNOSIS — R079 Chest pain, unspecified: Secondary | ICD-10-CM | POA: Diagnosis not present

## 2017-02-16 DIAGNOSIS — R0682 Tachypnea, not elsewhere classified: Secondary | ICD-10-CM

## 2017-02-16 DIAGNOSIS — R000 Tachycardia, unspecified: Secondary | ICD-10-CM | POA: Diagnosis not present

## 2017-02-16 DIAGNOSIS — N184 Chronic kidney disease, stage 4 (severe): Secondary | ICD-10-CM | POA: Diagnosis not present

## 2017-02-16 DIAGNOSIS — I519 Heart disease, unspecified: Secondary | ICD-10-CM | POA: Diagnosis not present

## 2017-02-16 DIAGNOSIS — J9811 Atelectasis: Secondary | ICD-10-CM | POA: Diagnosis not present

## 2017-02-16 DIAGNOSIS — M6281 Muscle weakness (generalized): Secondary | ICD-10-CM | POA: Diagnosis not present

## 2017-02-16 DIAGNOSIS — E1121 Type 2 diabetes mellitus with diabetic nephropathy: Secondary | ICD-10-CM | POA: Diagnosis not present

## 2017-02-16 DIAGNOSIS — I25110 Atherosclerotic heart disease of native coronary artery with unstable angina pectoris: Secondary | ICD-10-CM | POA: Diagnosis not present

## 2017-02-16 DIAGNOSIS — J939 Pneumothorax, unspecified: Secondary | ICD-10-CM | POA: Diagnosis not present

## 2017-02-16 DIAGNOSIS — I341 Nonrheumatic mitral (valve) prolapse: Secondary | ICD-10-CM | POA: Diagnosis not present

## 2017-02-16 DIAGNOSIS — R918 Other nonspecific abnormal finding of lung field: Secondary | ICD-10-CM | POA: Diagnosis not present

## 2017-02-16 DIAGNOSIS — Z7982 Long term (current) use of aspirin: Secondary | ICD-10-CM | POA: Diagnosis not present

## 2017-02-16 DIAGNOSIS — Z9689 Presence of other specified functional implants: Secondary | ICD-10-CM

## 2017-02-16 DIAGNOSIS — D72829 Elevated white blood cell count, unspecified: Secondary | ICD-10-CM | POA: Diagnosis not present

## 2017-02-16 DIAGNOSIS — R262 Difficulty in walking, not elsewhere classified: Secondary | ICD-10-CM | POA: Diagnosis not present

## 2017-02-16 DIAGNOSIS — Z79899 Other long term (current) drug therapy: Secondary | ICD-10-CM | POA: Diagnosis not present

## 2017-02-16 DIAGNOSIS — R279 Unspecified lack of coordination: Secondary | ICD-10-CM | POA: Diagnosis not present

## 2017-02-16 DIAGNOSIS — D696 Thrombocytopenia, unspecified: Secondary | ICD-10-CM | POA: Diagnosis present

## 2017-02-16 DIAGNOSIS — J90 Pleural effusion, not elsewhere classified: Secondary | ICD-10-CM | POA: Diagnosis not present

## 2017-02-16 DIAGNOSIS — Z8249 Family history of ischemic heart disease and other diseases of the circulatory system: Secondary | ICD-10-CM | POA: Diagnosis not present

## 2017-02-16 DIAGNOSIS — I219 Acute myocardial infarction, unspecified: Secondary | ICD-10-CM | POA: Diagnosis not present

## 2017-02-16 DIAGNOSIS — R0602 Shortness of breath: Secondary | ICD-10-CM | POA: Diagnosis not present

## 2017-02-16 HISTORY — PX: CORONARY ARTERY BYPASS GRAFT: SHX141

## 2017-02-16 HISTORY — PX: LEFT HEART CATH AND CORONARY ANGIOGRAPHY: CATH118249

## 2017-02-16 LAB — BASIC METABOLIC PANEL
ANION GAP: 9 (ref 5–15)
BUN: 25 mg/dL — ABNORMAL HIGH (ref 6–20)
CO2: 28 mmol/L (ref 22–32)
Calcium: 9.7 mg/dL (ref 8.9–10.3)
Chloride: 104 mmol/L (ref 101–111)
Creatinine, Ser: 1.53 mg/dL — ABNORMAL HIGH (ref 0.44–1.00)
GFR calc non Af Amer: 31 mL/min — ABNORMAL LOW (ref >60)
GFR, EST AFRICAN AMERICAN: 36 mL/min — AB (ref >60)
GLUCOSE: 131 mg/dL — AB (ref 65–99)
POTASSIUM: 3.8 mmol/L (ref 3.5–5.1)
Sodium: 141 mmol/L (ref 135–145)

## 2017-02-16 LAB — POCT I-STAT, CHEM 8
BUN: 23 mg/dL — ABNORMAL HIGH (ref 6–20)
BUN: 20 mg/dL (ref 6–20)
BUN: 21 mg/dL — AB (ref 6–20)
BUN: 21 mg/dL — ABNORMAL HIGH (ref 6–20)
BUN: 19 mg/dL (ref 6–20)
BUN: 17 mg/dL (ref 6–20)
CALCIUM ION: 1.22 mmol/L (ref 1.15–1.40)
CALCIUM ION: 1.2 mmol/L (ref 1.15–1.40)
CHLORIDE: 106 mmol/L (ref 101–111)
CHLORIDE: 105 mmol/L (ref 101–111)
CHLORIDE: 111 mmol/L (ref 101–111)
CREATININE: 1.1 mg/dL — AB (ref 0.44–1.00)
Calcium, Ion: 1.01 mmol/L — ABNORMAL LOW (ref 1.15–1.40)
Calcium, Ion: 1.17 mmol/L (ref 1.15–1.40)
Calcium, Ion: 1.1 mmol/L — ABNORMAL LOW (ref 1.15–1.40)
Calcium, Ion: 1.06 mmol/L — ABNORMAL LOW (ref 1.15–1.40)
Chloride: 106 mmol/L (ref 101–111)
Chloride: 108 mmol/L (ref 101–111)
Chloride: 107 mmol/L (ref 101–111)
Creatinine, Ser: 1.1 mg/dL — ABNORMAL HIGH (ref 0.44–1.00)
Creatinine, Ser: 1 mg/dL (ref 0.44–1.00)
Creatinine, Ser: 1.1 mg/dL — ABNORMAL HIGH (ref 0.44–1.00)
Creatinine, Ser: 0.8 mg/dL (ref 0.44–1.00)
Creatinine, Ser: 1 mg/dL (ref 0.44–1.00)
GLUCOSE: 124 mg/dL — AB (ref 65–99)
Glucose, Bld: 196 mg/dL — ABNORMAL HIGH (ref 65–99)
Glucose, Bld: 165 mg/dL — ABNORMAL HIGH (ref 65–99)
Glucose, Bld: 167 mg/dL — ABNORMAL HIGH (ref 65–99)
Glucose, Bld: 173 mg/dL — ABNORMAL HIGH (ref 65–99)
Glucose, Bld: 168 mg/dL — ABNORMAL HIGH (ref 65–99)
HCT: 29 % — ABNORMAL LOW (ref 36.0–46.0)
HCT: 33 % — ABNORMAL LOW (ref 36.0–46.0)
HEMATOCRIT: 24 % — AB (ref 36.0–46.0)
HEMATOCRIT: 25 % — AB (ref 36.0–46.0)
HEMATOCRIT: 28 % — AB (ref 36.0–46.0)
HEMATOCRIT: 24 % — AB (ref 36.0–46.0)
HEMOGLOBIN: 9.9 g/dL — AB (ref 12.0–15.0)
HEMOGLOBIN: 8.5 g/dL — AB (ref 12.0–15.0)
Hemoglobin: 8.2 g/dL — ABNORMAL LOW (ref 12.0–15.0)
Hemoglobin: 9.5 g/dL — ABNORMAL LOW (ref 12.0–15.0)
Hemoglobin: 8.2 g/dL — ABNORMAL LOW (ref 12.0–15.0)
Hemoglobin: 11.2 g/dL — ABNORMAL LOW (ref 12.0–15.0)
POTASSIUM: 4.6 mmol/L (ref 3.5–5.1)
POTASSIUM: 4.6 mmol/L (ref 3.5–5.1)
POTASSIUM: 4.8 mmol/L (ref 3.5–5.1)
POTASSIUM: 4.2 mmol/L (ref 3.5–5.1)
Potassium: 4.2 mmol/L (ref 3.5–5.1)
Potassium: 4.8 mmol/L (ref 3.5–5.1)
SODIUM: 143 mmol/L (ref 135–145)
SODIUM: 143 mmol/L (ref 135–145)
SODIUM: 143 mmol/L (ref 135–145)
SODIUM: 142 mmol/L (ref 135–145)
Sodium: 143 mmol/L (ref 135–145)
Sodium: 142 mmol/L (ref 135–145)
TCO2: 25 mmol/L (ref 0–100)
TCO2: 25 mmol/L (ref 0–100)
TCO2: 26 mmol/L (ref 0–100)
TCO2: 24 mmol/L (ref 0–100)
TCO2: 24 mmol/L (ref 0–100)
TCO2: 22 mmol/L (ref 0–100)

## 2017-02-16 LAB — ABO/RH: ABO/RH(D): B POS

## 2017-02-16 LAB — CBC
HCT: 30.2 % — ABNORMAL LOW (ref 36.0–46.0)
HCT: 33.4 % — ABNORMAL LOW (ref 36.0–46.0)
Hemoglobin: 9.8 g/dL — ABNORMAL LOW (ref 12.0–15.0)
Hemoglobin: 10.7 g/dL — ABNORMAL LOW (ref 12.0–15.0)
MCH: 25.2 pg — AB (ref 26.0–34.0)
MCH: 24.9 pg — ABNORMAL LOW (ref 26.0–34.0)
MCHC: 32.5 g/dL (ref 30.0–36.0)
MCHC: 32 g/dL (ref 30.0–36.0)
MCV: 77.6 fL — AB (ref 78.0–100.0)
MCV: 77.7 fL — ABNORMAL LOW (ref 78.0–100.0)
PLATELETS: 85 10*3/uL — AB (ref 150–400)
Platelets: 92 10*3/uL — ABNORMAL LOW (ref 150–400)
RBC: 3.89 MIL/uL (ref 3.87–5.11)
RBC: 4.3 MIL/uL (ref 3.87–5.11)
RDW: 14.5 % (ref 11.5–15.5)
RDW: 14.4 % (ref 11.5–15.5)
WBC: 9.9 10*3/uL (ref 4.0–10.5)
WBC: 8.9 10*3/uL (ref 4.0–10.5)

## 2017-02-16 LAB — CBC WITH DIFFERENTIAL/PLATELET
BASOS ABS: 0 10*3/uL (ref 0.0–0.1)
BASOS PCT: 0 %
Eosinophils Absolute: 0.2 10*3/uL (ref 0.0–0.7)
Eosinophils Relative: 2 %
HEMATOCRIT: 35.8 % — AB (ref 36.0–46.0)
HEMOGLOBIN: 11.2 g/dL — AB (ref 12.0–15.0)
Lymphocytes Relative: 69 %
Lymphs Abs: 8.6 10*3/uL — ABNORMAL HIGH (ref 0.7–4.0)
MCH: 24.6 pg — ABNORMAL LOW (ref 26.0–34.0)
MCHC: 31.3 g/dL (ref 30.0–36.0)
MCV: 78.5 fL (ref 78.0–100.0)
Monocytes Absolute: 0.6 10*3/uL (ref 0.1–1.0)
Monocytes Relative: 5 %
NEUTROS ABS: 3 10*3/uL (ref 1.7–7.7)
NEUTROS PCT: 24 %
Platelets: 257 10*3/uL (ref 150–400)
RBC: 4.56 MIL/uL (ref 3.87–5.11)
RDW: 14.5 % (ref 11.5–15.5)
WBC: 12.4 10*3/uL — AB (ref 4.0–10.5)

## 2017-02-16 LAB — GLUCOSE, CAPILLARY
GLUCOSE-CAPILLARY: 97 mg/dL (ref 65–99)
GLUCOSE-CAPILLARY: 105 mg/dL — AB (ref 65–99)
GLUCOSE-CAPILLARY: 156 mg/dL — AB (ref 65–99)
GLUCOSE-CAPILLARY: 121 mg/dL — AB (ref 65–99)
GLUCOSE-CAPILLARY: 100 mg/dL — AB (ref 65–99)
Glucose-Capillary: 97 mg/dL (ref 65–99)
Glucose-Capillary: 144 mg/dL — ABNORMAL HIGH (ref 65–99)

## 2017-02-16 LAB — CREATININE, SERUM
Creatinine, Ser: 1.07 mg/dL — ABNORMAL HIGH (ref 0.44–1.00)
GFR calc Af Amer: 56 mL/min — ABNORMAL LOW (ref >60)
GFR calc non Af Amer: 48 mL/min — ABNORMAL LOW (ref >60)

## 2017-02-16 LAB — APTT
APTT: 26 s (ref 24–36)
aPTT: 37 seconds — ABNORMAL HIGH (ref 24–36)

## 2017-02-16 LAB — HEMOGLOBIN AND HEMATOCRIT, BLOOD
HCT: 27.9 % — ABNORMAL LOW (ref 36.0–46.0)
Hemoglobin: 8.9 g/dL — ABNORMAL LOW (ref 12.0–15.0)

## 2017-02-16 LAB — POCT I-STAT 3, ART BLOOD GAS (G3+)
ACID-BASE DEFICIT: 4 mmol/L — AB (ref 0.0–2.0)
Acid-base deficit: 4 mmol/L — ABNORMAL HIGH (ref 0.0–2.0)
Acid-base deficit: 1 mmol/L (ref 0.0–2.0)
BICARBONATE: 23.9 mmol/L (ref 20.0–28.0)
BICARBONATE: 23.1 mmol/L (ref 20.0–28.0)
BICARBONATE: 20.9 mmol/L (ref 20.0–28.0)
O2 Saturation: 97 %
O2 Saturation: 100 %
O2 Saturation: 97 %
PCO2 ART: 55.6 mmHg — AB (ref 32.0–48.0)
PCO2 ART: 36.1 mmHg (ref 32.0–48.0)
PCO2 ART: 35.7 mmHg (ref 32.0–48.0)
PH ART: 7.241 — AB (ref 7.350–7.450)
PH ART: 7.414 (ref 7.350–7.450)
PO2 ART: 111 mmHg — AB (ref 83.0–108.0)
PO2 ART: 330 mmHg — AB (ref 83.0–108.0)
PO2 ART: 82 mmHg — AB (ref 83.0–108.0)
Patient temperature: 35.4
TCO2: 26 mmol/L (ref 0–100)
TCO2: 24 mmol/L (ref 0–100)
TCO2: 22 mmol/L (ref 0–100)
pH, Arterial: 7.368 (ref 7.350–7.450)

## 2017-02-16 LAB — LIPID PANEL
CHOL/HDL RATIO: 3.2 ratio
CHOLESTEROL: 70 mg/dL (ref 0–200)
HDL: 22 mg/dL — AB (ref >40)
LDL Cholesterol: 36 mg/dL (ref 0–99)
TRIGLYCERIDES: 62 mg/dL (ref <150)
VLDL: 12 mg/dL (ref 0–40)

## 2017-02-16 LAB — PROTIME-INR
INR: 0.94
INR: 1.52
PROTHROMBIN TIME: 12.6 s (ref 11.4–15.2)
Prothrombin Time: 18.4 seconds — ABNORMAL HIGH (ref 11.4–15.2)

## 2017-02-16 LAB — POCT I-STAT 4, (NA,K, GLUC, HGB,HCT)
Glucose, Bld: 112 mg/dL — ABNORMAL HIGH (ref 65–99)
HEMATOCRIT: 28 % — AB (ref 36.0–46.0)
HEMOGLOBIN: 9.5 g/dL — AB (ref 12.0–15.0)
Potassium: 3.7 mmol/L (ref 3.5–5.1)
SODIUM: 143 mmol/L (ref 135–145)

## 2017-02-16 LAB — TROPONIN I: Troponin I: 0.08 ng/mL (ref <0.03)

## 2017-02-16 LAB — TSH: TSH: 0.785 u[IU]/mL (ref 0.350–4.500)

## 2017-02-16 LAB — PLATELET COUNT: Platelets: 131 10*3/uL — ABNORMAL LOW (ref 150–400)

## 2017-02-16 LAB — MRSA PCR SCREENING: MRSA by PCR: NEGATIVE

## 2017-02-16 LAB — BRAIN NATRIURETIC PEPTIDE: B NATRIURETIC PEPTIDE 5: 176 pg/mL — AB (ref 0.0–100.0)

## 2017-02-16 LAB — MAGNESIUM: Magnesium: 3 mg/dL — ABNORMAL HIGH (ref 1.7–2.4)

## 2017-02-16 LAB — PREPARE RBC (CROSSMATCH)

## 2017-02-16 SURGERY — CORONARY ARTERY BYPASS GRAFTING (CABG)
Anesthesia: General | Site: Chest

## 2017-02-16 SURGERY — LEFT HEART CATH AND CORONARY ANGIOGRAPHY
Anesthesia: LOCAL

## 2017-02-16 MED ORDER — CEFUROXIME SODIUM 1.5 G IJ SOLR
1.5000 g | Freq: Two times a day (BID) | INTRAMUSCULAR | Status: AC
  Administered 2017-02-16 – 2017-02-18 (×4): 1.5 g via INTRAVENOUS
  Filled 2017-02-16 (×5): qty 1.5

## 2017-02-16 MED ORDER — 0.9 % SODIUM CHLORIDE (POUR BTL) OPTIME
TOPICAL | Status: DC | PRN
  Administered 2017-02-16: 6000 mL

## 2017-02-16 MED ORDER — POTASSIUM CHLORIDE 2 MEQ/ML IV SOLN
80.0000 meq | INTRAVENOUS | Status: DC
  Filled 2017-02-16: qty 40

## 2017-02-16 MED ORDER — ASPIRIN EC 325 MG PO TBEC: 325.0000 mg | DELAYED_RELEASE_TABLET | Freq: Every day | ORAL | Status: DC

## 2017-02-16 MED ORDER — ORAL CARE MOUTH RINSE
15.0000 mL | Freq: Four times a day (QID) | OROMUCOSAL | Status: DC
  Administered 2017-02-16 – 2017-02-17 (×3): 15 mL via OROMUCOSAL

## 2017-02-16 MED ORDER — LIDOCAINE HCL (PF) 1 % IJ SOLN
INTRAMUSCULAR | Status: AC
  Filled 2017-02-16: qty 30

## 2017-02-16 MED ORDER — NITROGLYCERIN IN D5W 200-5 MCG/ML-% IV SOLN
5.0000 ug/min | Freq: Once | INTRAVENOUS | Status: AC
  Administered 2017-02-16: 10 ug/min via INTRAVENOUS

## 2017-02-16 MED ORDER — PLASMA-LYTE 148 IV SOLN
INTRAVENOUS | Status: AC
  Administered 2017-02-16: 500 mL
  Filled 2017-02-16: qty 2.5

## 2017-02-16 MED ORDER — LIDOCAINE HCL (PF) 1 % IJ SOLN
INTRAMUSCULAR | Status: DC | PRN
  Administered 2017-02-16: 2 mL
  Administered 2017-02-16: 15 mL

## 2017-02-16 MED ORDER — LACTATED RINGERS IV SOLN
INTRAVENOUS | Status: DC
  Administered 2017-02-16 – 2017-02-18 (×2): 20 mL/h via INTRAVENOUS

## 2017-02-16 MED ORDER — MORPHINE SULFATE (PF) 4 MG/ML IV SOLN
4.0000 mg | Freq: Once | INTRAVENOUS | Status: AC
  Administered 2017-02-16: 4 mg via INTRAVENOUS

## 2017-02-16 MED ORDER — SODIUM CHLORIDE 0.9 % IV SOLN
INTRAVENOUS | Status: DC
  Filled 2017-02-16: qty 30

## 2017-02-16 MED ORDER — SODIUM CHLORIDE 0.9 % IV SOLN
INTRAVENOUS | Status: AC
  Administered 2017-02-16: 2.7 [IU]/h via INTRAVENOUS
  Filled 2017-02-16: qty 2.5

## 2017-02-16 MED ORDER — SODIUM CHLORIDE 0.9 % IV SOLN
INTRAVENOUS | Status: DC
  Administered 2017-02-17: 10 mL/h via INTRAVENOUS

## 2017-02-16 MED ORDER — HEPARIN SODIUM (PORCINE) 5000 UNIT/ML IJ SOLN
4000.0000 [IU] | Freq: Once | INTRAMUSCULAR | Status: AC
  Administered 2017-02-16: 4000 [IU] via INTRAVENOUS

## 2017-02-16 MED ORDER — DOPAMINE-DEXTROSE 3.2-5 MG/ML-% IV SOLN
0.0000 ug/kg/min | INTRAVENOUS | Status: DC
  Filled 2017-02-16 (×2): qty 250

## 2017-02-16 MED ORDER — CHLORHEXIDINE GLUCONATE 0.12 % MT SOLN
15.0000 mL | OROMUCOSAL | Status: AC
  Administered 2017-02-16: 15 mL via OROMUCOSAL

## 2017-02-16 MED ORDER — ACETAMINOPHEN 160 MG/5ML PO SOLN
1000.0000 mg | Freq: Four times a day (QID) | ORAL | Status: AC
  Administered 2017-02-17 – 2017-02-19 (×7): 1000 mg
  Filled 2017-02-16 (×7): qty 40.6

## 2017-02-16 MED ORDER — ACETAMINOPHEN 160 MG/5ML PO SOLN: 650.0000 mg | Freq: Once | ORAL | Status: AC

## 2017-02-16 MED ORDER — ASPIRIN 81 MG PO CHEW
324.0000 mg | CHEWABLE_TABLET | Freq: Once | ORAL | Status: AC
  Administered 2017-02-16: 324 mg via ORAL
  Filled 2017-02-16: qty 4

## 2017-02-16 MED ORDER — ARTIFICIAL TEARS OP OINT
TOPICAL_OINTMENT | OPHTHALMIC | Status: AC
Start: 2017-02-16 — End: 2017-02-16
  Filled 2017-02-16: qty 3.5

## 2017-02-16 MED ORDER — HEPARIN (PORCINE) IN NACL 2-0.9 UNIT/ML-% IJ SOLN
INTRAMUSCULAR | Status: DC | PRN
  Administered 2017-02-16: 1500 mL

## 2017-02-16 MED ORDER — MORPHINE SULFATE (PF) 2 MG/ML IV SOLN
2.0000 mg | INTRAVENOUS | Status: DC | PRN
  Administered 2017-02-17 – 2017-02-19 (×10): 2 mg via INTRAVENOUS
  Filled 2017-02-16 (×2): qty 1
  Filled 2017-02-16 (×2): qty 2
  Filled 2017-02-16 (×2): qty 1
  Filled 2017-02-16: qty 2
  Filled 2017-02-16 (×2): qty 1
  Filled 2017-02-16: qty 2

## 2017-02-16 MED ORDER — FENTANYL CITRATE (PF) 100 MCG/2ML IJ SOLN
INTRAMUSCULAR | Status: DC | PRN
  Administered 2017-02-16 (×2): 25 ug via INTRAVENOUS

## 2017-02-16 MED ORDER — SODIUM CHLORIDE 0.9 % IV SOLN
INTRAVENOUS | Status: DC | PRN
  Administered 2017-02-16: 1 mg/kg/h via INTRAVENOUS

## 2017-02-16 MED ORDER — ATORVASTATIN CALCIUM 40 MG PO TABS
40.0000 mg | ORAL_TABLET | Freq: Every day | ORAL | Status: DC
  Administered 2017-02-16 – 2017-03-01 (×14): 40 mg via ORAL
  Filled 2017-02-16 (×13): qty 1

## 2017-02-16 MED ORDER — PROTAMINE SULFATE 10 MG/ML IV SOLN
INTRAVENOUS | Status: AC
  Filled 2017-02-16: qty 25

## 2017-02-16 MED ORDER — SODIUM CHLORIDE 0.9 % IV SOLN
30.0000 meq | Freq: Once | INTRAVENOUS | Status: AC
  Administered 2017-02-16: 30 meq via INTRAVENOUS

## 2017-02-16 MED ORDER — LACTATED RINGERS IV SOLN: INTRAVENOUS | Status: DC

## 2017-02-16 MED ORDER — DEXMEDETOMIDINE HCL IN NACL 200 MCG/50ML IV SOLN
0.0000 ug/kg/h | INTRAVENOUS | Status: DC
  Administered 2017-02-16: 0.4 ug/kg/h via INTRAVENOUS
  Administered 2017-02-16 – 2017-02-17 (×2): 0.7 ug/kg/h via INTRAVENOUS
  Administered 2017-02-17: 0.2 ug/kg/h via INTRAVENOUS
  Administered 2017-02-17: 0.7 ug/kg/h via INTRAVENOUS
  Administered 2017-02-18: 0.3 ug/kg/h via INTRAVENOUS
  Administered 2017-02-18: 0.4 ug/kg/h via INTRAVENOUS
  Filled 2017-02-16 (×8): qty 50

## 2017-02-16 MED ORDER — MIDAZOLAM HCL 2 MG/2ML IJ SOLN
INTRAMUSCULAR | Status: DC | PRN
  Administered 2017-02-16 (×2): 1 mg via INTRAVENOUS

## 2017-02-16 MED ORDER — INSULIN REGULAR BOLUS VIA INFUSION
0.0000 [IU] | Freq: Three times a day (TID) | INTRAVENOUS | Status: DC
  Filled 2017-02-16: qty 10

## 2017-02-16 MED ORDER — METOPROLOL TARTRATE 12.5 MG HALF TABLET: 12.5000 mg | ORAL_TABLET | Freq: Once | ORAL | Status: DC

## 2017-02-16 MED ORDER — ROCURONIUM BROMIDE 50 MG/5ML IV SOSY
PREFILLED_SYRINGE | INTRAVENOUS | Status: AC
  Filled 2017-02-16: qty 15

## 2017-02-16 MED ORDER — ARTIFICIAL TEARS OP OINT
TOPICAL_OINTMENT | OPHTHALMIC | Status: DC | PRN
  Administered 2017-02-16: 1 via OPHTHALMIC

## 2017-02-16 MED ORDER — MIDAZOLAM HCL 2 MG/2ML IJ SOLN
INTRAMUSCULAR | Status: AC
  Filled 2017-02-16: qty 2

## 2017-02-16 MED ORDER — MILRINONE LACTATE IN DEXTROSE 20-5 MG/100ML-% IV SOLN
0.2500 ug/kg/min | INTRAVENOUS | Status: AC
  Administered 2017-02-16 – 2017-02-20 (×6): 0.25 ug/kg/min via INTRAVENOUS
  Filled 2017-02-16 (×7): qty 100

## 2017-02-16 MED ORDER — SODIUM CHLORIDE 0.45 % IV SOLN
INTRAVENOUS | Status: DC | PRN
  Administered 2017-02-16 – 2017-02-17 (×2): 20 mL/h via INTRAVENOUS

## 2017-02-16 MED ORDER — NITROGLYCERIN IN D5W 200-5 MCG/ML-% IV SOLN
0.0000 ug/min | INTRAVENOUS | Status: DC
  Administered 2017-02-19: 5 ug/min via INTRAVENOUS
  Administered 2017-02-20: 50 ug/min via INTRAVENOUS
  Filled 2017-02-16: qty 250

## 2017-02-16 MED ORDER — CHLORHEXIDINE GLUCONATE 0.12 % MT SOLN: 15.0000 mL | Freq: Once | OROMUCOSAL | Status: DC

## 2017-02-16 MED ORDER — BIVALIRUDIN 250 MG IV SOLR
INTRAVENOUS | Status: AC
  Filled 2017-02-16: qty 250

## 2017-02-16 MED ORDER — HEPARIN (PORCINE) IN NACL 100-0.45 UNIT/ML-% IJ SOLN
12.0000 [IU]/kg/h | Freq: Once | INTRAMUSCULAR | Status: AC
  Administered 2017-02-16: 12 [IU]/kg/h via INTRAVENOUS

## 2017-02-16 MED ORDER — BIVALIRUDIN BOLUS VIA INFUSION - CUPID
INTRAVENOUS | Status: DC | PRN
  Administered 2017-02-16: 60.525 mg via INTRAVENOUS

## 2017-02-16 MED ORDER — MORPHINE SULFATE (PF) 4 MG/ML IV SOLN
INTRAVENOUS | Status: AC
  Filled 2017-02-16: qty 1

## 2017-02-16 MED ORDER — FENTANYL CITRATE (PF) 250 MCG/5ML IJ SOLN
INTRAMUSCULAR | Status: AC
  Filled 2017-02-16: qty 20

## 2017-02-16 MED ORDER — FENTANYL CITRATE (PF) 100 MCG/2ML IJ SOLN
INTRAMUSCULAR | Status: AC
  Filled 2017-02-16: qty 2

## 2017-02-16 MED ORDER — SODIUM CHLORIDE 0.9 % IV SOLN
1250.0000 mg | INTRAVENOUS | Status: AC
  Administered 2017-02-16: 1250 mg via INTRAVENOUS
  Filled 2017-02-16: qty 1250

## 2017-02-16 MED ORDER — ALBUMIN HUMAN 5 % IV SOLN
INTRAVENOUS | Status: DC | PRN
  Administered 2017-02-16: 11:00:00 via INTRAVENOUS

## 2017-02-16 MED ORDER — VERAPAMIL HCL 2.5 MG/ML IV SOLN
INTRAVENOUS | Status: DC | PRN
  Administered 2017-02-16: 10 mL via INTRA_ARTERIAL

## 2017-02-16 MED ORDER — LACTATED RINGERS IV SOLN: 500.0000 mL | Freq: Once | INTRAVENOUS | Status: DC | PRN

## 2017-02-16 MED ORDER — EPINEPHRINE PF 1 MG/ML IJ SOLN
0.0000 ug/min | INTRAVENOUS | Status: DC
  Filled 2017-02-16: qty 4

## 2017-02-16 MED ORDER — HEPARIN SODIUM (PORCINE) 1000 UNIT/ML IJ SOLN
INTRAMUSCULAR | Status: DC | PRN
  Administered 2017-02-16: 17000 [IU] via INTRAVENOUS

## 2017-02-16 MED ORDER — OXYCODONE HCL 5 MG PO TABS
5.0000 mg | ORAL_TABLET | ORAL | Status: DC | PRN
  Administered 2017-02-22: 5 mg via ORAL
  Filled 2017-02-16: qty 1

## 2017-02-16 MED ORDER — IOPAMIDOL (ISOVUE-370) INJECTION 76%
INTRAVENOUS | Status: DC | PRN
  Administered 2017-02-16: 75 mL via INTRA_ARTERIAL

## 2017-02-16 MED ORDER — METOPROLOL TARTRATE 12.5 MG HALF TABLET
12.5000 mg | ORAL_TABLET | Freq: Two times a day (BID) | ORAL | Status: DC
  Administered 2017-02-19 – 2017-02-20 (×3): 12.5 mg via ORAL
  Filled 2017-02-16 (×3): qty 1

## 2017-02-16 MED ORDER — DEXTROSE 5 % IV SOLN
750.0000 mg | INTRAVENOUS | Status: AC
  Administered 2017-02-16: .75 g via INTRAVENOUS
  Filled 2017-02-16: qty 750

## 2017-02-16 MED ORDER — HEPARIN (PORCINE) IN NACL 100-0.45 UNIT/ML-% IJ SOLN
INTRAMUSCULAR | Status: AC
  Filled 2017-02-16: qty 250

## 2017-02-16 MED ORDER — ROCURONIUM BROMIDE 10 MG/ML (PF) SYRINGE
PREFILLED_SYRINGE | INTRAVENOUS | Status: DC | PRN
  Administered 2017-02-16 (×3): 50 mg via INTRAVENOUS

## 2017-02-16 MED ORDER — ALBUMIN HUMAN 5 % IV SOLN
250.0000 mL | INTRAVENOUS | Status: AC | PRN
  Administered 2017-02-16 – 2017-02-17 (×3): 250 mL via INTRAVENOUS
  Filled 2017-02-16 (×2): qty 250

## 2017-02-16 MED ORDER — DEXTROSE 5 % IV SOLN
1.5000 g | INTRAVENOUS | Status: AC
  Administered 2017-02-16: 1.5 g via INTRAVENOUS
  Filled 2017-02-16: qty 1.5

## 2017-02-16 MED ORDER — SODIUM CHLORIDE 0.9 % IJ SOLN
INTRAMUSCULAR | Status: AC
  Filled 2017-02-16: qty 10

## 2017-02-16 MED ORDER — DEXMEDETOMIDINE HCL IN NACL 400 MCG/100ML IV SOLN
0.1000 ug/kg/h | INTRAVENOUS | Status: AC
  Administered 2017-02-16: .2 ug/kg/h via INTRAVENOUS
  Filled 2017-02-16: qty 100

## 2017-02-16 MED ORDER — DOPAMINE-DEXTROSE 3.2-5 MG/ML-% IV SOLN
0.0000 ug/kg/min | INTRAVENOUS | Status: AC
  Administered 2017-02-16: 3 ug/kg/min via INTRAVENOUS
  Filled 2017-02-16: qty 250

## 2017-02-16 MED ORDER — LACTATED RINGERS IV SOLN
INTRAVENOUS | Status: DC | PRN
  Administered 2017-02-16: 06:00:00 via INTRAVENOUS

## 2017-02-16 MED ORDER — PROPOFOL 10 MG/ML IV BOLUS
INTRAVENOUS | Status: AC
  Filled 2017-02-16: qty 20

## 2017-02-16 MED ORDER — SODIUM CHLORIDE 0.9 % IJ SOLN
INTRAMUSCULAR | Status: DC | PRN
  Administered 2017-02-16 (×3): 4 mL via TOPICAL

## 2017-02-16 MED ORDER — METOPROLOL TARTRATE 25 MG/10 ML ORAL SUSPENSION: 12.5000 mg | Freq: Two times a day (BID) | ORAL | Status: DC

## 2017-02-16 MED ORDER — DOCUSATE SODIUM 100 MG PO CAPS
200.0000 mg | ORAL_CAPSULE | Freq: Every day | ORAL | Status: DC
  Administered 2017-02-19 – 2017-02-21 (×3): 200 mg via ORAL
  Filled 2017-02-16 (×3): qty 2

## 2017-02-16 MED ORDER — ACETAMINOPHEN 650 MG RE SUPP
650.0000 mg | Freq: Once | RECTAL | Status: AC
  Administered 2017-02-16: 650 mg via RECTAL

## 2017-02-16 MED ORDER — TRANEXAMIC ACID (OHS) PUMP PRIME SOLUTION
2.0000 mg/kg | INTRAVENOUS | Status: DC
  Filled 2017-02-16: qty 1.61

## 2017-02-16 MED ORDER — ACETAMINOPHEN 500 MG PO TABS
1000.0000 mg | ORAL_TABLET | Freq: Four times a day (QID) | ORAL | Status: AC
  Administered 2017-02-19 – 2017-02-21 (×9): 1000 mg via ORAL
  Filled 2017-02-16 (×8): qty 2

## 2017-02-16 MED ORDER — SODIUM CHLORIDE 0.9 % IV SOLN: 250.0000 mL | INTRAVENOUS | Status: DC

## 2017-02-16 MED ORDER — VANCOMYCIN HCL IN DEXTROSE 1-5 GM/200ML-% IV SOLN
1000.0000 mg | Freq: Once | INTRAVENOUS | Status: AC
  Administered 2017-02-16: 1000 mg via INTRAVENOUS
  Filled 2017-02-16 (×2): qty 200

## 2017-02-16 MED ORDER — HEMOSTATIC AGENTS (NO CHARGE) OPTIME
TOPICAL | Status: DC | PRN
  Administered 2017-02-16 (×2): 1 via TOPICAL

## 2017-02-16 MED ORDER — NITROGLYCERIN IN D5W 200-5 MCG/ML-% IV SOLN
INTRAVENOUS | Status: AC
  Filled 2017-02-16: qty 250

## 2017-02-16 MED ORDER — SODIUM CHLORIDE 0.9% FLUSH
3.0000 mL | Freq: Two times a day (BID) | INTRAVENOUS | Status: DC
  Administered 2017-02-17 – 2017-02-18 (×3): 3 mL via INTRAVENOUS
  Administered 2017-02-19: 6 mL via INTRAVENOUS
  Administered 2017-02-19 – 2017-02-21 (×4): 3 mL via INTRAVENOUS

## 2017-02-16 MED ORDER — METOPROLOL TARTRATE 5 MG/5ML IV SOLN
2.5000 mg | INTRAVENOUS | Status: DC | PRN
  Administered 2017-02-16: 2.5 mg via INTRAVENOUS
  Administered 2017-02-20: 5 mg via INTRAVENOUS
  Filled 2017-02-16: qty 5

## 2017-02-16 MED ORDER — TRANEXAMIC ACID 1000 MG/10ML IV SOLN
1.5000 mg/kg/h | INTRAVENOUS | Status: AC
  Administered 2017-02-16: 1.5 mg/kg/h via INTRAVENOUS
  Filled 2017-02-16: qty 25

## 2017-02-16 MED ORDER — ONDANSETRON HCL 4 MG/2ML IJ SOLN
4.0000 mg | Freq: Four times a day (QID) | INTRAMUSCULAR | Status: DC | PRN
  Administered 2017-02-17 – 2017-02-21 (×6): 4 mg via INTRAVENOUS
  Filled 2017-02-16 (×7): qty 2

## 2017-02-16 MED ORDER — VERAPAMIL HCL 2.5 MG/ML IV SOLN
INTRAVENOUS | Status: AC
  Filled 2017-02-16: qty 2

## 2017-02-16 MED ORDER — FAMOTIDINE IN NACL 20-0.9 MG/50ML-% IV SOLN
20.0000 mg | Freq: Two times a day (BID) | INTRAVENOUS | Status: AC
  Administered 2017-02-16 (×2): 20 mg via INTRAVENOUS
  Filled 2017-02-16: qty 50

## 2017-02-16 MED ORDER — TEMAZEPAM 15 MG PO CAPS: 15.0000 mg | ORAL_CAPSULE | Freq: Once | ORAL | Status: DC | PRN

## 2017-02-16 MED ORDER — BISACODYL 10 MG RE SUPP: 10.0000 mg | Freq: Every day | RECTAL | Status: DC

## 2017-02-16 MED ORDER — BISACODYL 5 MG PO TBEC
5.0000 mg | DELAYED_RELEASE_TABLET | Freq: Once | ORAL | Status: DC
  Filled 2017-02-16: qty 1

## 2017-02-16 MED ORDER — HEPARIN SODIUM (PORCINE) 1000 UNIT/ML IJ SOLN
INTRAMUSCULAR | Status: AC
  Filled 2017-02-16: qty 1

## 2017-02-16 MED ORDER — SUCCINYLCHOLINE CHLORIDE 200 MG/10ML IV SOSY
PREFILLED_SYRINGE | INTRAVENOUS | Status: AC
  Filled 2017-02-16: qty 10

## 2017-02-16 MED ORDER — SODIUM CHLORIDE 0.9 % IV SOLN
30.0000 ug/min | INTRAVENOUS | Status: DC
  Filled 2017-02-16: qty 2

## 2017-02-16 MED ORDER — ASPIRIN 81 MG PO CHEW
324.0000 mg | CHEWABLE_TABLET | Freq: Every day | ORAL | Status: DC
  Administered 2017-02-17: 324 mg
  Filled 2017-02-16: qty 4

## 2017-02-16 MED ORDER — CHLORHEXIDINE GLUCONATE 0.12% ORAL RINSE (MEDLINE KIT)
15.0000 mL | Freq: Two times a day (BID) | OROMUCOSAL | Status: DC
  Administered 2017-02-16 – 2017-02-17 (×2): 15 mL via OROMUCOSAL

## 2017-02-16 MED ORDER — MORPHINE SULFATE (PF) 2 MG/ML IV SOLN
1.0000 mg | INTRAVENOUS | Status: AC | PRN
  Administered 2017-02-16 (×2): 2 mg via INTRAVENOUS

## 2017-02-16 MED ORDER — MIDAZOLAM HCL 2 MG/2ML IJ SOLN
2.0000 mg | INTRAMUSCULAR | Status: DC | PRN
  Administered 2017-02-16 – 2017-02-17 (×4): 2 mg via INTRAVENOUS
  Administered 2017-02-18: 1 mg via INTRAVENOUS
  Administered 2017-02-18: 2 mg via INTRAVENOUS
  Filled 2017-02-16 (×7): qty 2

## 2017-02-16 MED ORDER — SODIUM CHLORIDE 0.9 % IV SOLN
INTRAVENOUS | Status: DC
  Administered 2017-02-17: 1 [IU]/h via INTRAVENOUS
  Filled 2017-02-16 (×3): qty 2.5

## 2017-02-16 MED ORDER — BISACODYL 5 MG PO TBEC
10.0000 mg | DELAYED_RELEASE_TABLET | Freq: Every day | ORAL | Status: DC
  Administered 2017-02-19 – 2017-02-21 (×3): 10 mg via ORAL
  Filled 2017-02-16 (×3): qty 2

## 2017-02-16 MED ORDER — MAGNESIUM SULFATE 50 % IJ SOLN
40.0000 meq | INTRAMUSCULAR | Status: DC
  Filled 2017-02-16: qty 10

## 2017-02-16 MED ORDER — SODIUM CHLORIDE 0.9% FLUSH: 3.0000 mL | INTRAVENOUS | Status: DC | PRN

## 2017-02-16 MED ORDER — PROTAMINE SULFATE 10 MG/ML IV SOLN
INTRAVENOUS | Status: DC | PRN
  Administered 2017-02-16 (×2): 75 mg via INTRAVENOUS

## 2017-02-16 MED ORDER — SODIUM CHLORIDE 0.9 % IV SOLN
Freq: Once | INTRAVENOUS | Status: AC
Start: 2017-02-16 — End: 2017-02-16
  Administered 2017-02-16: 06:00:00 via INTRAVENOUS

## 2017-02-16 MED ORDER — CHLORHEXIDINE GLUCONATE CLOTH 2 % EX PADS: 6.0000 | MEDICATED_PAD | Freq: Once | CUTANEOUS | Status: DC

## 2017-02-16 MED ORDER — SODIUM CHLORIDE 0.9 % IV SOLN
0.0000 ug/min | INTRAVENOUS | Status: DC
  Administered 2017-02-17: 60 ug/min via INTRAVENOUS
  Administered 2017-02-17: 25 ug/min via INTRAVENOUS
  Administered 2017-02-18: 30 ug/min via INTRAVENOUS
  Filled 2017-02-16 (×6): qty 2

## 2017-02-16 MED ORDER — HEPARIN (PORCINE) IN NACL 2-0.9 UNIT/ML-% IJ SOLN
INTRAMUSCULAR | Status: AC
  Filled 2017-02-16: qty 1000

## 2017-02-16 MED ORDER — TRANEXAMIC ACID (OHS) BOLUS VIA INFUSION
15.0000 mg/kg | INTRAVENOUS | Status: AC
  Administered 2017-02-16: 1210.5 mg via INTRAVENOUS
  Filled 2017-02-16: qty 1211

## 2017-02-16 MED ORDER — IOPAMIDOL (ISOVUE-370) INJECTION 76%
INTRAVENOUS | Status: AC
  Filled 2017-02-16: qty 125

## 2017-02-16 MED ORDER — NITROGLYCERIN 2 % TD OINT
1.0000 [in_us] | TOPICAL_OINTMENT | Freq: Once | TRANSDERMAL | Status: AC
  Administered 2017-02-16: 1 [in_us] via TOPICAL
  Filled 2017-02-16: qty 1

## 2017-02-16 MED ORDER — NITROGLYCERIN IN D5W 200-5 MCG/ML-% IV SOLN
2.0000 ug/min | INTRAVENOUS | Status: AC
  Administered 2017-02-16: 16.6 ug/min via INTRAVENOUS
  Filled 2017-02-16: qty 250

## 2017-02-16 MED ORDER — PANTOPRAZOLE SODIUM 40 MG PO TBEC
40.0000 mg | DELAYED_RELEASE_TABLET | Freq: Every day | ORAL | Status: DC
  Administered 2017-02-19 – 2017-02-21 (×3): 40 mg via ORAL
  Filled 2017-02-16 (×3): qty 1

## 2017-02-16 MED ORDER — MAGNESIUM SULFATE 4 GM/100ML IV SOLN
4.0000 g | Freq: Once | INTRAVENOUS | Status: AC
  Administered 2017-02-16: 4 g via INTRAVENOUS
  Filled 2017-02-16: qty 100

## 2017-02-16 MED ORDER — MIDAZOLAM HCL 5 MG/5ML IJ SOLN
INTRAMUSCULAR | Status: DC | PRN
  Administered 2017-02-16: 2 mg via INTRAVENOUS
  Administered 2017-02-16 (×5): 1 mg via INTRAVENOUS

## 2017-02-16 MED ORDER — FENTANYL CITRATE (PF) 250 MCG/5ML IJ SOLN
INTRAMUSCULAR | Status: DC | PRN
  Administered 2017-02-16 (×3): 100 ug via INTRAVENOUS
  Administered 2017-02-16: 50 ug via INTRAVENOUS
  Administered 2017-02-16: 100 ug via INTRAVENOUS
  Administered 2017-02-16 (×2): 50 ug via INTRAVENOUS
  Administered 2017-02-16: 100 ug via INTRAVENOUS
  Administered 2017-02-16: 250 ug via INTRAVENOUS
  Administered 2017-02-16: 100 ug via INTRAVENOUS

## 2017-02-16 MED ORDER — TRAMADOL HCL 50 MG PO TABS: 50.0000 mg | ORAL_TABLET | ORAL | Status: DC | PRN

## 2017-02-16 MED ORDER — MIDAZOLAM HCL 10 MG/2ML IJ SOLN
INTRAMUSCULAR | Status: AC
  Filled 2017-02-16: qty 2

## 2017-02-16 SURGICAL SUPPLY — 84 items
ADAPTER CARDIO PERF ANTE/RETRO (ADAPTER) ×3 IMPLANT
ADH SKN CLS APL DERMABOND .7 (GAUZE/BANDAGES/DRESSINGS) ×2
ADPR PRFSN 84XANTGRD RTRGD (ADAPTER) ×2
BAG DECANTER FOR FLEXI CONT (MISCELLANEOUS) ×3 IMPLANT
BANDAGE ACE 4X5 VEL STRL LF (GAUZE/BANDAGES/DRESSINGS) IMPLANT
BANDAGE ACE 6X5 VEL STRL LF (GAUZE/BANDAGES/DRESSINGS) IMPLANT
BANDAGE ELASTIC 4 VELCRO ST LF (GAUZE/BANDAGES/DRESSINGS) ×3 IMPLANT
BANDAGE ELASTIC 6 VELCRO ST LF (GAUZE/BANDAGES/DRESSINGS) ×3 IMPLANT
BLADE STERNUM SYSTEM 6 (BLADE) ×3 IMPLANT
BLADE SURG 11 STRL SS (BLADE) ×3 IMPLANT
BNDG GAUZE ELAST 4 BULKY (GAUZE/BANDAGES/DRESSINGS) ×3 IMPLANT
CANISTER SUCT 3000ML PPV (MISCELLANEOUS) ×3 IMPLANT
CANNULA GUNDRY RCSP 15FR (MISCELLANEOUS) ×3 IMPLANT
CANNULA VEN 2 STAGE (MISCELLANEOUS) ×3 IMPLANT
CATH CPB KIT GERHARDT (MISCELLANEOUS) ×3 IMPLANT
CATH HEART VENT LEFT (CATHETERS) ×2 IMPLANT
CATH THORACIC 28FR (CATHETERS) ×3 IMPLANT
CLIP TI WIDE RED SMALL 24 (CLIP) ×3 IMPLANT
CRADLE DONUT ADULT HEAD (MISCELLANEOUS) ×3 IMPLANT
DERMABOND ADVANCED (GAUZE/BANDAGES/DRESSINGS) ×1
DERMABOND ADVANCED .7 DNX12 (GAUZE/BANDAGES/DRESSINGS) ×2 IMPLANT
DRAIN CHANNEL 28F RND 3/8 FF (WOUND CARE) ×3 IMPLANT
DRAPE CARDIOVASCULAR INCISE (DRAPES) ×2
DRAPE SLUSH/WARMER DISC (DRAPES) ×3 IMPLANT
DRAPE SRG 135X102X78XABS (DRAPES) ×2 IMPLANT
DRSG AQUACEL AG ADV 3.5X14 (GAUZE/BANDAGES/DRESSINGS) ×3 IMPLANT
ELECT BLADE 4.0 EZ CLEAN MEGAD (MISCELLANEOUS) ×3
ELECT REM PT RETURN 9FT ADLT (ELECTROSURGICAL) ×6
ELECTRODE BLDE 4.0 EZ CLN MEGD (MISCELLANEOUS) ×2 IMPLANT
ELECTRODE REM PT RTRN 9FT ADLT (ELECTROSURGICAL) ×4 IMPLANT
FELT TEFLON 1X6 (MISCELLANEOUS) ×3 IMPLANT
GAUZE SPONGE 4X4 12PLY STRL (GAUZE/BANDAGES/DRESSINGS) IMPLANT
GLOVE BIO SURGEON STRL SZ 6 (GLOVE) ×9 IMPLANT
GLOVE BIO SURGEON STRL SZ 6.5 (GLOVE) ×9 IMPLANT
GLOVE BIOGEL M 6.5 STRL (GLOVE) ×12 IMPLANT
GLOVE BIOGEL PI IND STRL 6 (GLOVE) ×4 IMPLANT
GLOVE BIOGEL PI INDICATOR 6 (GLOVE) ×2
GOWN STRL REUS W/ TWL LRG LVL3 (GOWN DISPOSABLE) ×14 IMPLANT
GOWN STRL REUS W/TWL LRG LVL3 (GOWN DISPOSABLE) ×21
HEMOSTAT POWDER SURGIFOAM 1G (HEMOSTASIS) ×9 IMPLANT
HEMOSTAT SURGICEL 2X14 (HEMOSTASIS) ×3 IMPLANT
KIT BASIN OR (CUSTOM PROCEDURE TRAY) ×3 IMPLANT
KIT CATH SUCT 8FR (CATHETERS) ×3 IMPLANT
KIT ROOM TURNOVER OR (KITS) ×3 IMPLANT
KIT SUCTION CATH 14FR (SUCTIONS) ×6 IMPLANT
KIT VASOVIEW HEMOPRO VH 3000 (KITS) ×3 IMPLANT
LEAD PACING MYOCARDI (MISCELLANEOUS) ×3 IMPLANT
MARKER GRAFT CORONARY BYPASS (MISCELLANEOUS) ×9 IMPLANT
NS IRRIG 1000ML POUR BTL (IV SOLUTION) ×18 IMPLANT
PACK OPEN HEART (CUSTOM PROCEDURE TRAY) ×3 IMPLANT
PAD ARMBOARD 7.5X6 YLW CONV (MISCELLANEOUS) ×6 IMPLANT
PAD ELECT DEFIB RADIOL ZOLL (MISCELLANEOUS) ×3 IMPLANT
PENCIL BUTTON HOLSTER BLD 10FT (ELECTRODE) ×3 IMPLANT
PUNCH AORTIC ROTATE  4.5MM 8IN (MISCELLANEOUS) ×3 IMPLANT
SET CARDIOPLEGIA MPS 5001102 (MISCELLANEOUS) ×3 IMPLANT
SPONGE GAUZE 4X4 12PLY STER LF (GAUZE/BANDAGES/DRESSINGS) ×6 IMPLANT
SPONGE LAP 18X18 X RAY DECT (DISPOSABLE) ×12 IMPLANT
SURGIFLO W/THROMBIN 8M KIT (HEMOSTASIS) ×3 IMPLANT
SUT BONE WAX W31G (SUTURE) ×3 IMPLANT
SUT MNCRL AB 4-0 PS2 18 (SUTURE) ×3 IMPLANT
SUT PROLENE 3 0 SH1 36 (SUTURE) ×6 IMPLANT
SUT PROLENE 4 0 TF (SUTURE) ×6 IMPLANT
SUT PROLENE 5 0 C 1 36 (SUTURE) ×3 IMPLANT
SUT PROLENE 6 0 C 1 30 (SUTURE) ×9 IMPLANT
SUT PROLENE 6 0 CC (SUTURE) ×6 IMPLANT
SUT PROLENE 7 0 BV1 MDA (SUTURE) ×3 IMPLANT
SUT STEEL 6MS V (SUTURE) ×3 IMPLANT
SUT STEEL SZ 6 DBL 3X14 BALL (SUTURE) ×3 IMPLANT
SUT VIC AB 1 CTX 18 (SUTURE) ×6 IMPLANT
SUT VIC AB 2-0 CT1 27 (SUTURE) ×3
SUT VIC AB 2-0 CT1 TAPERPNT 27 (SUTURE) ×2 IMPLANT
SUT VIC AB 2-0 CTX 27 (SUTURE) ×3 IMPLANT
SUTURE E-PAK OPEN HEART (SUTURE) ×3 IMPLANT
SYSTEM SAHARA CHEST DRAIN ATS (WOUND CARE) ×3 IMPLANT
TAPE CLOTH SURG 4X10 WHT LF (GAUZE/BANDAGES/DRESSINGS) ×6 IMPLANT
TOWEL GREEN STERILE (TOWEL DISPOSABLE) ×3 IMPLANT
TOWEL GREEN STERILE FF (TOWEL DISPOSABLE) ×3 IMPLANT
TOWEL OR 17X24 6PK STRL BLUE (TOWEL DISPOSABLE) IMPLANT
TOWEL OR 17X26 10 PK STRL BLUE (TOWEL DISPOSABLE) IMPLANT
TRAY FOLEY IC TEMP SENS 16FR (CATHETERS) ×3 IMPLANT
TUBING INSUFFLATION (TUBING) ×3 IMPLANT
UNDERPAD 30X30 (UNDERPADS AND DIAPERS) ×3 IMPLANT
VENT LEFT HEART 12002 (CATHETERS) ×3
WATER STERILE IRR 1000ML POUR (IV SOLUTION) ×6 IMPLANT

## 2017-02-16 SURGICAL SUPPLY — 16 items
BALLN LINEAR 7.5FR IABP 34CC (BALLOONS) ×2
BALLOON LINEAR 7.5FR IABP 34CC (BALLOONS) ×1 IMPLANT
CATH INFINITI JR4 5F (CATHETERS) ×2 IMPLANT
CATH VISTA GUIDE 6FR XBLAD3.0 (CATHETERS) ×2 IMPLANT
DEVICE SECURE STATLOCK IABP (MISCELLANEOUS) ×4 IMPLANT
ELECT DEFIB PAD ADLT CADENCE (PAD) ×2 IMPLANT
GLIDESHEATH SLEND SS 6F .021 (SHEATH) ×2 IMPLANT
GUIDEWIRE INQWIRE 1.5J.035X260 (WIRE) ×1 IMPLANT
INQWIRE 1.5J .035X260CM (WIRE) ×2
KIT HEART LEFT (KITS) ×2 IMPLANT
PACK CARDIAC CATHETERIZATION (CUSTOM PROCEDURE TRAY) ×2 IMPLANT
SHEATH PINNACLE 6F 10CM (SHEATH) ×2 IMPLANT
TRANSDUCER W/STOPCOCK (MISCELLANEOUS) ×2 IMPLANT
TUBING CIL FLEX 10 FLL-RA (TUBING) ×2 IMPLANT
WIRE EMERALD 3MM-J .035X150CM (WIRE) ×2 IMPLANT
WIRE HI TORQ VERSACORE-J 145CM (WIRE) ×2 IMPLANT

## 2017-02-16 NOTE — OR Nursing (Signed)
1218 Rolling call made to SICU.

## 2017-02-16 NOTE — ED Notes (Signed)
CRITICAL VALUE ALERT  Critical value received:  Troponin 0.08  Date of notification:  02/16/17  Time of notification:  0327  Critical value read back: Yes  Nurse who received alert:  Natividad Brood, RN  MD notified (1st page): Dr. Tomi Bamberger  Time of first page:  0327  MD notified (2nd page):  Time of second page:  Responding MD: Dr. Tomi Bamberger  Time MD responded:  [phone]

## 2017-02-16 NOTE — ED Triage Notes (Signed)
Pt states for the past 4 nights, she has been having pain in her upper chest, jaws and teeth, states it comes and goes.

## 2017-02-16 NOTE — Anesthesia Procedure Notes (Signed)
Arterial Line Insertion Start/End2/24/2018 6:10 AM, 02/16/2017 6:15 AM Performed by: Lillia Abed, anesthesiologist  brachial was placed Catheter size: 20 Fr Hand hygiene performed , maximum sterile barriers used  and Seldinger technique used  Attempts: 1 Procedure performed without using ultrasound guided technique. Following insertion, line sutured and dressing applied. Post procedure assessment: normal  Patient tolerated the procedure well with no immediate complications.

## 2017-02-16 NOTE — Anesthesia Procedure Notes (Signed)
Procedure Name: Intubation Date/Time: 02/16/2017 6:23 AM Performed by: Zorita Pang Pre-anesthesia Checklist: Patient identified, Emergency Drugs available, Suction available, Patient being monitored and Timeout performed Patient Re-evaluated:Patient Re-evaluated prior to inductionOxygen Delivery Method: Circle system utilized Preoxygenation: Pre-oxygenation with 100% oxygen Intubation Type: IV induction and Rapid sequence Laryngoscope Size: Mac and 3 Grade View: Grade I Tube type: Subglottic suction tube Tube size: 7.5 mm Number of attempts: 1 Airway Equipment and Method: Stylet Placement Confirmation: ETT inserted through vocal cords under direct vision,  positive ETCO2 and breath sounds checked- equal and bilateral Secured at: 21 cm Tube secured with: Tape Dental Injury: Teeth and Oropharynx as per pre-operative assessment

## 2017-02-16 NOTE — Transfer of Care (Signed)
Immediate Anesthesia Transfer of Care Note  Patient: Stephanie Hendricks  Procedure(s) Performed: Procedure(s): CORONARY ARTERY BYPASS GRAFTING (CABG) x 3 (LIMA to LAD, SVG SEQUENTIALLY to RAMUS INTERMEDIATE and DISTAL CIRCUMFLEX) WITH ENDOSCOPIC HARVESTING OF RIGHT SAPHENOUS VEIN (N/A) TRANSESOPHAGEAL ECHOCARDIOGRAM W/O CARDIOVERSION  Patient Location: ICU  Anesthesia Type:General  Level of Consciousness: sedated and unresponsive  Airway & Oxygen Therapy: Patient remains intubated per anesthesia plan and Patient placed on Ventilator (see vital sign flow sheet for setting)  Post-op Assessment: Report given to RN and Post -op Vital signs reviewed and stable  Post vital signs: Reviewed and stable  Last Vitals:  Vitals:   02/16/17 0552 02/16/17 0557  BP: (!) 160/82 (!) 147/78  Pulse: 72 75  Resp: (!) 0 (!) 0  Temp:      Last Pain:  Vitals:   02/16/17 0509  TempSrc:   PainSc: Asleep         Complications: No apparent anesthesia complications

## 2017-02-16 NOTE — Progress Notes (Signed)
  Echocardiogram Echocardiogram Transesophageal has been performed.  Tresa Res 02/16/2017, 7:15 AM

## 2017-02-16 NOTE — OR Nursing (Signed)
1146 Second call made to SICU.

## 2017-02-16 NOTE — OR Nursing (Signed)
1113 First call made to SICU.

## 2017-02-16 NOTE — Anesthesia Procedure Notes (Signed)
Central Venous Catheter Insertion Performed by: Lillia Abed, anesthesiologist Start/End2/24/2018 6:30 AM, 02/16/2017 7:00 AM Preanesthetic checklist: patient identified, IV checked, site marked, risks and benefits discussed, surgical consent, monitors and equipment checked, pre-op evaluation, timeout performed and anesthesia consent Position: Trendelenburg Patient sedated Hand hygiene performed , maximum sterile barriers used  and Seldinger technique used Central line was placed.MAC introducer Swan type:thermodilution Procedure performed using ultrasound guided technique. Ultrasound Notes:anatomy identified, needle tip was noted to be adjacent to the nerve/plexus identified, no ultrasound evidence of intravascular and/or intraneural injection and image(s) printed for medical record Attempts: 1 Following insertion, line sutured. Post procedure assessment: blood return through all ports, free fluid flow and no air  Patient tolerated the procedure well with no immediate complications.

## 2017-02-16 NOTE — H&P (Signed)
History and Physical  Primary Cardiologist: Satira Sark PCP: Wende Neighbors, MD  Chief Complaint: Chest pain  HPI: Ms. Stephanie Hendricks is a very pleasant 76 yo woman with past medical history of sinus bradycardia, CKD, DM, HTN, hyperlipidemia who is coming as a transfer from [facility] for high risk NSTEMI. Patient reports having chest pressure for last 4 nights usually in around the morning. Today she started having severe chest pressure radiating to her jaw and ears, and back. She came to [facility] for evaluation, still complaining of chest pressure. ECG showing diffuse ST depressions with ST elevation in AVR. First troponin came back 0.08. Transfer was called and she will be taken straight to the cath lab. Patient denies having these kind of symptoms before. She is seen in the clinic by Dr. Domenic Polite for mild aortic stenosis, sinus bradycardia, HTN and hyperlipidemia.     Prior Studies  Last echo 02/2015: - Left ventricle: The cavity size was normal. There was mild concentric hypertrophy. Systolic function was normal. The estimated ejection fraction was in the range of 60% to 65%. Wall motion was normal; there were no regional wall motion abnormalities. Doppler parameters are consistent with abnormal left ventricular relaxation (grade 1 diastolic dysfunction). Doppler parameters are consistent with high ventricular filling pressure. Ratio of mitral valve peak E velocity to medial annulus E (e&') velocity: 15.77. - Aortic valve: Mildly calcified annulus. Mildly calcified leaflets. Minimal aortic stenosis. Mean gradient (S): 12 mm Hg. - Mitral valve: Mildly calcified annulus. There was mild regurgitation. - Left atrium: The atrium was severely dilated. Volume/bsa, ES (1-plane Simpson&'s, A4C): 40.9 ml/m^2. - Right atrium: The atrium was mildly dilated. - Tricuspid valve: There was mild regurgitation. - Pulmonary arteries: PA peak pressure: 51 mm Hg (S).  Moderately elevated pulmonary pressures. - Systemic veins: IVC is at upper normal limits in size. Normal respiratory variation.  Past Medical History:  Diagnosis Date  . Aortic stenosis    Minimal  . Bradycardia   . Chronic kidney disease, stage III (moderate)   . Degenerative joint disease   . Diabetes mellitus type II   . Essential hypertension, benign   . Hyperlipidemia   . Lower extremity edema   . Microcytic anemia   . Palpitations     Past Surgical History:  Procedure Laterality Date  . COLONOSCOPY  08/28/2011   sigmoid colon serated adenoma, next colonoscopy 08/2016  . COLONOSCOPY W/ POLYPECTOMY  2005   Dr. Tamela Oddi resected per patient  . ESOPHAGOGASTRODUODENOSCOPY     remote, foreign body extraction  . ESOPHAGOGASTRODUODENOSCOPY  08/28/2011   hiatal hernia, erosion on fold of diaphragmatic hiatus (cameron lesion), antral and prepylori erosions, SB bx negative, gastric bx showed reactive gastropathy but no H.Pylori  . HEMORROIDECTOMY    . TONSILLECTOMY    . TUBAL LIGATION      Family History  Problem Relation Age of Onset  . Hypertension Father   . Coronary artery disease Sister   . Heart attack Brother     Deceased  . Colon cancer Neg Hx   . Liver disease Neg Hx    Social History:  reports that she has never smoked. She has never used smokeless tobacco. She reports that she does not drink alcohol or use drugs.  Allergies: No Known Allergies  No current facility-administered medications on file prior to encounter.    Current Outpatient Prescriptions on File Prior to Encounter  Medication Sig Dispense Refill  . amLODipine-valsartan (EXFORGE) 10-320 MG tablet Take  1 tablet by mouth daily. 90 tablet 3  . aspirin 81 MG tablet Take 81 mg by mouth daily.      . cloNIDine (CATAPRES - DOSED IN MG/24 HR) 0.3 mg/24hr patch APPLY 1 PATCH ONCE WEEKLY. 12 patch 3  . eplerenone (INSPRA) 50 MG tablet Take 50 mg by mouth daily.     Marland Kitchen glipiZIDE (GLIPIZIDE XL) 5 MG 24  hr tablet Take 1 tablet (5 mg total) by mouth daily with breakfast. Do not take this medication if your blood sugar is less than 110.    . Linagliptin-Metformin HCl (JENTADUETO) 2.5-500 MG TABS Take 1 tablet by mouth 2 (two) times daily. Do not take this medication if your blood sugar is less than 110.    . simvastatin (ZOCOR) 20 MG tablet TAKE (1) TABLET BY MOUTH ONCE DAILY AT BEDTIME FOR CHOLESTEROL. 90 tablet 3    Results for orders placed or performed during the hospital encounter of 02/16/17 (from the past 48 hour(s))  CBC with Differential     Status: Abnormal   Collection Time: 02/16/17  2:42 AM  Result Value Ref Range   WBC 12.4 (H) 4.0 - 10.5 K/uL   RBC 4.56 3.87 - 5.11 MIL/uL   Hemoglobin 11.2 (L) 12.0 - 15.0 g/dL   HCT 35.8 (L) 36.0 - 46.0 %   MCV 78.5 78.0 - 100.0 fL   MCH 24.6 (L) 26.0 - 34.0 pg   MCHC 31.3 30.0 - 36.0 g/dL   RDW 14.5 11.5 - 15.5 %   Platelets 257 150 - 400 K/uL   Neutrophils Relative % 24 %   Neutro Abs 3.0 1.7 - 7.7 K/uL   Lymphocytes Relative 69 %   Lymphs Abs 8.6 (H) 0.7 - 4.0 K/uL   Monocytes Relative 5 %   Monocytes Absolute 0.6 0.1 - 1.0 K/uL   Eosinophils Relative 2 %   Eosinophils Absolute 0.2 0.0 - 0.7 K/uL   Basophils Relative 0 %   Basophils Absolute 0.0 0.0 - 0.1 K/uL  Basic metabolic panel     Status: Abnormal   Collection Time: 02/16/17  2:42 AM  Result Value Ref Range   Sodium 141 135 - 145 mmol/L   Potassium 3.8 3.5 - 5.1 mmol/L   Chloride 104 101 - 111 mmol/L   CO2 28 22 - 32 mmol/L   Glucose, Bld 131 (H) 65 - 99 mg/dL   BUN 25 (H) 6 - 20 mg/dL   Creatinine, Ser 1.53 (H) 0.44 - 1.00 mg/dL   Calcium 9.7 8.9 - 10.3 mg/dL   GFR calc non Af Amer 31 (L) >60 mL/min   GFR calc Af Amer 36 (L) >60 mL/min    Comment: (NOTE) The eGFR has been calculated using the CKD EPI equation. This calculation has not been validated in all clinical situations. eGFR's persistently <60 mL/min signify possible Chronic Kidney Disease.    Anion gap 9  5 - 15  Troponin I     Status: Abnormal   Collection Time: 02/16/17  2:42 AM  Result Value Ref Range   Troponin I 0.08 (HH) <0.03 ng/mL    Comment: CRITICAL RESULT CALLED TO, READ BACK BY AND VERIFIED WITH:  HUTCHENS,L @ 0326 ON 02/16/17 BY JUW   Brain natriuretic peptide     Status: Abnormal   Collection Time: 02/16/17  2:42 AM  Result Value Ref Range   B Natriuretic Peptide 176.0 (H) 0.0 - 100.0 pg/mL  Protime-INR     Status: None  Collection Time: 02/16/17  2:42 AM  Result Value Ref Range   Prothrombin Time 12.6 11.4 - 15.2 seconds   INR 0.94   APTT     Status: None   Collection Time: 02/16/17  2:42 AM  Result Value Ref Range   aPTT 26 24 - 36 seconds   Dg Chest Port 1 View  Result Date: 02/16/2017 CLINICAL DATA:  Pain in the upper chest EXAM: PORTABLE CHEST 1 VIEW COMPARISON:  10/20/2007 FINDINGS: Elevation of the left diaphragm. Cannot exclude left basilar atelectasis or infiltrate. Possible tiny left effusion. Mild cardiomegaly without overt failure. Right lung grossly clear. No pneumothorax. IMPRESSION: 1. Elevated left diaphragm. Cannot exclude mild left basilar atelectasis or infiltrate. Possible tiny left effusion 2. Mild cardiomegaly. Electronically Signed   By: Kim  Fujinaga M.D.   On: 02/16/2017 03:30    ECG: NSR 80 bpm, diffuse ST depressions in V2-6, I, II, AVF, ST elevation in AVR   ROS: As above. Otherwise, review of systems is negative unless per above HPI  Vitals:   02/16/17 0229 02/16/17 0234 02/16/17 0317  BP: 151/68  147/64  Pulse: 84  82  Resp: 18  18  Temp: 97.8 F (36.6 C)    TempSrc: Oral    SpO2: 97%  93%  Weight:  80.7 kg (178 lb)   Height:  5' 1" (1.549 m)    Wt Readings from Last 10 Encounters:  02/16/17 80.7 kg (178 lb)  12/31/16 81.6 kg (180 lb)  12/07/15 79.8 kg (176 lb)  08/16/15 81.2 kg (179 lb)  02/17/15 80.3 kg (177 lb)  11/07/14 83.2 kg (183 lb 6.8 oz)  10/29/14 82.6 kg (182 lb)  10/27/13 83.5 kg (184 lb)  10/22/12 78.9 kg (174  lb 0.6 oz)  08/19/12 78.2 kg (172 lb 6.4 oz)    PE:  General: No acute distress HEENT: Atraumatic, EOMI, mucous membranes moist CV: RRR no murmurs, gallops. No JVD. Respiratory: Clear, no crackles. Normal work of breathing ABD: Non-distended and non-tender. No palpable organomegaly.  Extremities: 2+ radial pulses bilaterally. No edema. Neuro/Psych: CN grossly intact, alert and oriented  Assessment/Plan  1. NSTEMI - Patient presented with chest pressure radiating to her jaw and ears. ECG showing dynamic changes with diffuse ST depressions. First troponin 0.08. Still complaining of 5/10 chest pain. Case was discussed with Dr. McAlhany, interventional cardiologist on call, and patient will be brought to the cath lab for urgent cardiac catheterization. She received ASA 325 mg and was started on heparin drip.   Further instructions after cardiac catheterization  Szymon Lukasz Wiernek  MD 02/16/2017, 4:02 AM   

## 2017-02-16 NOTE — ED Provider Notes (Signed)
[facility] DEPT Provider Note   CSN: [id number] Arrival date & time: 02/16/17  0218  Time seen 02:45 AM   History   Chief Complaint Chief Complaint  Patient presents with  . Chest Pain    HPI Stephanie Hendricks is a 76 y.o. female.  HPI  patient states for the past 3 or 4 nights around 3 in the morning she has been getting diffuse chest pain that radiates up into her jaw and into her ears and gums. She also states he goes into her upper back. She states her last about one hour. She takes Tylenol which doesn't seem to help. She states normally she is awake watching TV. She describes the pain as pressure. She denies shortness of breath, sweatiness, nausea or vomiting. She states it makes her feel restless. She states sometimes if she goes to the bathroom and has a bowel movement or passes gas he gets better. She states laying down will make it feel worse and sitting up makes it better. She has had swelling in her lower extremities over the last 3-4 years. She describes her current pain is a 5 out of 10 but at its worse tonight it was a 6-7 out of 10. She states the pain started somewhere between 1 and 1:30 this morning. She states she's never had this pain before.  Family history she states her sister who is 78 has had a cardiac stent. She states her brother died in his sleep from presumed MI.  Patient states she sees Dr. Domenic Polite, cardiologist for a low heart rate.  PCP Wende Neighbors, MD Cardiologist Dr Domenic Polite  Past Medical History:  Diagnosis Date  . Aortic stenosis    Minimal  . Bradycardia   . Chronic kidney disease, stage III (moderate)   . Degenerative joint disease   . Diabetes mellitus type II   . Essential hypertension, benign   . Hyperlipidemia   . Lower extremity edema   . Microcytic anemia   . Palpitations     Patient Active Problem List   Diagnosis Date Noted  . Acute gastroenteritis 11/07/2014  . Intractable nausea and vomiting 11/06/2014  . Obesity 11/06/2014   . Chronic kidney disease (CKD), stage III (moderate) 11/06/2014  . Microcytic anemia 11/06/2014  . Aortic valve disorders 10/22/2012  . Bradycardia   . Essential hypertension   . Hyperlipidemia   . Type II diabetes mellitus with nephropathy ([place])   . Hx of colonic polyps 08/21/2011    Past Surgical History:  Procedure Laterality Date  . COLONOSCOPY  08/28/2011   sigmoid colon serated adenoma, next colonoscopy 08/2016  . COLONOSCOPY W/ POLYPECTOMY  2005   Dr. Tamela Oddi resected per patient  . ESOPHAGOGASTRODUODENOSCOPY     remote, foreign body extraction  . ESOPHAGOGASTRODUODENOSCOPY  08/28/2011   hiatal hernia, erosion on fold of diaphragmatic hiatus (cameron lesion), antral and prepylori erosions, SB bx negative, gastric bx showed reactive gastropathy but no H.Pylori  . HEMORROIDECTOMY    . TONSILLECTOMY    . TUBAL LIGATION      OB History    No data available       Home Medications    Prior to Admission medications   Medication Sig Start Date End Date Taking? Authorizing Provider  amLODipine-valsartan (EXFORGE) 10-320 MG tablet Take 1 tablet by mouth daily. 02/11/17   Lendon Colonel, NP  aspirin 81 MG tablet Take 81 mg by mouth daily.      Historical Provider, MD  cloNIDine (CATAPRES - DOSED  IN MG/24 HR) 0.3 mg/24hr patch APPLY 1 PATCH ONCE WEEKLY. 02/08/17   Satira Sark, MD  eplerenone (INSPRA) 50 MG tablet Take 50 mg by mouth daily.  10/24/15 10/23/16  Historical Provider, MD  glipiZIDE (GLIPIZIDE XL) 5 MG 24 hr tablet Take 1 tablet (5 mg total) by mouth daily with breakfast. Do not take this medication if your blood sugar is less than 110. 11/07/14   Rexene Alberts, MD  Linagliptin-Metformin HCl (JENTADUETO) 2.5-500 MG TABS Take 1 tablet by mouth 2 (two) times daily. Do not take this medication if your blood sugar is less than 110. 11/07/14   Rexene Alberts, MD  simvastatin (ZOCOR) 20 MG tablet TAKE (1) TABLET BY MOUTH ONCE DAILY AT BEDTIME FOR CHOLESTEROL. 01/08/17    Satira Sark, MD    Family History Family History  Problem Relation Age of Onset  . Hypertension Father   . Coronary artery disease Sister   . Heart attack Brother     Deceased  . Colon cancer Neg Hx   . Liver disease Neg Hx     Social History Social History  Substance Use Topics  . Smoking status: Never Smoker  . Smokeless tobacco: Never Used  . Alcohol use No  lives at home Son and GD live with her   Allergies   Patient has no known allergies.   Review of Systems Review of Systems  All other systems reviewed and are negative.    Physical Exam Updated Vital Signs BP 147/64 (BP Location: Left Arm)   Pulse 82   Temp 97.8 F (36.6 C) (Oral)   Resp 18   Ht 5\' 1"  (1.549 m)   Wt 178 lb (80.7 kg)   SpO2 93%   BMI 33.63 kg/m   Vital signs normal    Physical Exam  Constitutional: She is oriented to person, place, and time. She appears well-developed and well-nourished.  Non-toxic appearance. She does not appear ill. No distress.  HENT:  Head: Normocephalic and atraumatic.  Right Ear: External ear normal.  Left Ear: External ear normal.  Nose: Nose normal. No mucosal edema or rhinorrhea.  Mouth/Throat: Oropharynx is clear and moist and mucous membranes are normal. No dental abscesses or uvula swelling.  Eyes: Conjunctivae and EOM are normal. Pupils are equal, round, and reactive to light.  Neck: Normal range of motion and full passive range of motion without pain. Neck supple.  Cardiovascular: Normal rate, regular rhythm and normal heart sounds.  Exam reveals no gallop and no friction rub.   No murmur heard. Pulmonary/Chest: Effort normal and breath sounds normal. No respiratory distress. She has no wheezes. She has no rhonchi. She has no rales. She exhibits no tenderness and no crepitus.  Abdominal: Soft. Normal appearance and bowel sounds are normal. She exhibits no distension. There is no tenderness. There is no rebound and no guarding.    Musculoskeletal: Normal range of motion. She exhibits no edema or tenderness.  Moves all extremities well.   Neurological: She is alert and oriented to person, place, and time. She has normal strength. No cranial nerve deficit.  Skin: Skin is warm, dry and intact. No rash noted. No erythema. No pallor.  Psychiatric: She has a normal mood and affect. Her speech is normal and behavior is normal. Her mood appears not anxious.  Nursing note and vitals reviewed.    ED Treatments / Results  Labs (all labs ordered are listed, but only abnormal results are displayed) Results for orders placed or  performed during the hospital encounter of 02/16/17  CBC with Differential  Result Value Ref Range   WBC 12.4 (H) 4.0 - 10.5 K/uL   RBC 4.56 3.87 - 5.11 MIL/uL   Hemoglobin 11.2 (L) 12.0 - 15.0 g/dL   HCT 35.8 (L) 36.0 - 46.0 %   MCV 78.5 78.0 - 100.0 fL   MCH 24.6 (L) 26.0 - 34.0 pg   MCHC 31.3 30.0 - 36.0 g/dL   RDW 14.5 11.5 - 15.5 %   Platelets 257 150 - 400 K/uL   Neutrophils Relative % 24 %   Neutro Abs 3.0 1.7 - 7.7 K/uL   Lymphocytes Relative 69 %   Lymphs Abs 8.6 (H) 0.7 - 4.0 K/uL   Monocytes Relative 5 %   Monocytes Absolute 0.6 0.1 - 1.0 K/uL   Eosinophils Relative 2 %   Eosinophils Absolute 0.2 0.0 - 0.7 K/uL   Basophils Relative 0 %   Basophils Absolute 0.0 0.0 - 0.1 K/uL  Basic metabolic panel  Result Value Ref Range   Sodium 141 135 - 145 mmol/L   Potassium 3.8 3.5 - 5.1 mmol/L   Chloride 104 101 - 111 mmol/L   CO2 28 22 - 32 mmol/L   Glucose, Bld 131 (H) 65 - 99 mg/dL   BUN 25 (H) 6 - 20 mg/dL   Creatinine, Ser 1.53 (H) 0.44 - 1.00 mg/dL   Calcium 9.7 8.9 - 10.3 mg/dL   GFR calc non Af Amer 31 (L) >60 mL/min   GFR calc Af Amer 36 (L) >60 mL/min   Anion gap 9 5 - 15  Troponin I  Result Value Ref Range   Troponin I 0.08 (HH) <0.03 ng/mL  Protime-INR  Result Value Ref Range   Prothrombin Time 12.6 11.4 - 15.2 seconds   INR 0.94   APTT  Result Value Ref Range    aPTT 26 24 - 36 seconds   Laboratory interpretation all normal except + troponin, renal insufficiency, leukocytosis    EKG  EKG Interpretation  Date/Time:  Saturday February 16 2017 02:44:31 EST Ventricular Rate:  76 PR Interval:    QRS Duration: 78 QT Interval:  398 QTC Calculation: 448 R Axis:   36 Text Interpretation:  Sinus rhythm Posterior infarct, old Since last tracing 20 Oct 2007 Nonspecific repol abnormality, diffuse leads Baseline wander in lead(s) I III aVR aVL V2 V4 Confirmed by Quanisha Drewry  MD-I, Ramiz Turpin ([id number]) on 02/16/2017 2:59:42 AM      #2  EKG Interpretation  Date/Time:  Saturday February 16 2017 03:07:22 EST Ventricular Rate:  80 PR Interval:    QRS Duration: 81 QT Interval:  327 QTC Calculation: 378 R Axis:   32 Text Interpretation:  Sinus rhythm Borderline short PR interval Probable posterior infarct, recent Lateral leads are also involved Since last tracing 20 Oct 2007 ST now depressed in Inferolateral leads and septal leads ST elevation in aVR Confirmed by Carlei Huang  MD-I, Ever Gustafson ([id number]) on 02/16/2017 3:23:32 AM        Radiology Dg Chest Port 1 View  Result Date: 02/16/2017 CLINICAL DATA:  Pain in the upper chest EXAM: PORTABLE CHEST 1 VIEW COMPARISON:  10/20/2007 FINDINGS: Elevation of the left diaphragm. Cannot exclude left basilar atelectasis or infiltrate. Possible tiny left effusion. Mild cardiomegaly without overt failure. Right lung grossly clear. No pneumothorax. IMPRESSION: 1. Elevated left diaphragm. Cannot exclude mild left basilar atelectasis or infiltrate. Possible tiny left effusion 2. Mild cardiomegaly. Electronically Signed   By: Donavan Foil  M.D.   On: 02/16/2017 03:30    Procedures Procedures (including critical care time)  CRITICAL CARE Performed by: Zia Kanner L Lanesha Azzaro Total critical care time: 33 minutes Critical care time was exclusive of separately billable procedures and treating other patients. Critical care was necessary to treat or prevent  imminent or life-threatening deterioration. Critical care was time spent personally by me on the following activities: development of treatment plan with patient and/or surrogate as well as nursing, discussions with consultants, evaluation of patient's response to treatment, examination of patient, obtaining history from patient or surrogate, ordering and performing treatments and interventions, ordering and review of laboratory studies, ordering and review of radiographic studies, pulse oximetry and re-evaluation of patient's condition.   Medications Ordered in ED Medications  heparin 100-0.45 UNIT/ML-% infusion (not administered)  morphine 4 MG/ML injection 4 mg (not administered)  nitroGLYCERIN 50 mg in dextrose 5 % 250 mL (0.2 mg/mL) infusion (not administered)  aspirin chewable tablet 324 mg (324 mg Oral Given 02/16/17 0313)  nitroGLYCERIN (NITROGLYN) 2 % ointment 1 inch (1 inch Topical Given 02/16/17 0314)  heparin injection 4,000 Units (4,000 Units Intravenous Given 02/16/17 0341)  heparin ADULT infusion 100 units/mL (25000 units/225mL sodium chloride 0.45%) (12 Units/kg/hr  80.7 kg Intravenous New Bag/Given 02/16/17 0339)     Initial Impression / Assessment and Plan / ED Course  I have reviewed the triage vital signs and the nursing notes.  Pertinent labs & imaging results that were available during my care of the patient were reviewed by me and considered in my medical decision making (see chart for details).  I was looking at patients EKG and use while in her room. The EKG is poor quality with wandering baseline. A repeat was done which showed new ST changes since her last EKG in 2008. Patient was given aspirin to chew and started on nitroglycerin paste. She was discussed with the cardiology fellow, Dr. William Dalton, who has reviewed her EKG. He states she will need to [facility] to [facility], he is going to talk to the interventionalist to decide if she needs to get a CTA of the chest before they do  cardiac cath, states to hold heparin for now.   03:34 AM Dr William Dalton, states to start heparin, he wants cath lab to be activated, but not for STEMI, [facility] aware, no trucks available, [facility] called.  -3:40 AM Pt started on nitroglycerin drip, she states her pain is unchanged, she was given morphine also for pain.   [facility] here 03:42 AM  03:55 AM EMS leaving with patient, she states her pain is alittle better, but still present.   Final Clinical Impressions(s) / ED Diagnoses   Final diagnoses:  NSTEMI (non-ST elevated myocardial infarction) [facility])    Plan transfer to [facility] to cath lab  Rolland Porter, MD, Barbette Or, MD 02/16/17 [phone]

## 2017-02-16 NOTE — Progress Notes (Signed)
Patient ID: Stephanie Hendricks, female   DOB: 13-Dec-1941, 76 y.o.   MRN: [id number] EVENING ROUNDS NOTE :     [address]       [place] [address]             [phone]                 Day of Surgery Procedure(s) (LRB): CORONARY ARTERY BYPASS GRAFTING (CABG) x 3 (LIMA to LAD, SVG SEQUENTIALLY to RAMUS INTERMEDIATE and DISTAL CIRCUMFLEX) WITH ENDOSCOPIC HARVESTING OF RIGHT SAPHENOUS VEIN (N/A) TRANSESOPHAGEAL ECHOCARDIOGRAM W/O CARDIOVERSION  Total Length of Stay:  LOS: 0 days  BP (!) 97/46   Pulse 78   Temp 99.7 F (37.6 C)   Resp 12   Ht 5\' 1"  (1.549 m)   Wt 178 lb (80.7 kg)   SpO2 99%   BMI 33.63 kg/m   .Intake/Output      02/24 0701 - 02/25 0700   I.V. (mL/kg) 2754.6 (34.1)   Blood 595   NG/GT 30   IV Piggyback 915   Total Intake(mL/kg) 4294.6 (53.2)   Urine (mL/kg/hr) 1080 (1.1)   Emesis/NG output 30 (0)   Blood 1520 (1.6)   Chest Tube 120 (0.1)   Total Output 2750   Net +1544.6         . sodium chloride 20 mL/hr at 02/16/17 1800  . [START ON 02/17/2017] sodium chloride    . sodium chloride 10 mL/hr at 02/16/17 1800  . dexmedetomidine 0.198 mcg/kg/hr (02/16/17 1800)  . DOPamine 2.511 mcg/kg/min (02/16/17 1800)  . insulin (NOVOLIN-R) infusion 1.4 Units/hr (02/16/17 1744)  . lactated ringers 10 mL/hr at 02/16/17 1800  . lactated ringers 20 mL/hr at 02/16/17 1800  . milrinone 0.25 mcg/kg/min (02/16/17 1800)  . nitroGLYCERIN Stopped (02/16/17 1300)  . phenylephrine (NEO-SYNEPHRINE) Adult infusion 10 mcg/min (02/16/17 1811)     Lab Results  Component Value Date   WBC 8.9 02/16/2017   HGB 11.2 (L) 02/16/2017   HCT 33.0 (L) 02/16/2017   PLT 92 (L) 02/16/2017   GLUCOSE 124 (H) 02/16/2017   CHOL 70 02/16/2017   TRIG 62 02/16/2017   HDL 22 (L) 02/16/2017   LDLCALC 36 02/16/2017   ALT 11 11/06/2014   AST 13 11/06/2014   NA 142 02/16/2017   K 4.8 02/16/2017   CL 111 02/16/2017   CREATININE 1.00 02/16/2017   BUN 17 02/16/2017   CO2 28  02/16/2017   TSH 0.785 02/16/2017   INR 1.52 02/16/2017   HGBA1C 6.5 (H) 11/06/2014   Not bleeding ci 1.8-1.9  On vent , opens eyes but too sleepy to wean vent   Grace Isaac MD  Beeper [phone] Office [phone] 02/16/2017 7:09 PM

## 2017-02-16 NOTE — ED Notes (Signed)
Attempted to call report to the cath lab and the line was busy.

## 2017-02-16 NOTE — Progress Notes (Signed)
Pt had one pair of glasses, one yellow colored ring with 1 green stone, 2 red stones, and 2 clear stones. One yellow colored ring with a purple stone. One yellow colored ring with multiple clear stones in an oval shape. One yellow colored ring with multiple clear stones in a round shape.   These belongings taken to security and secured in the safe. Yellow copy of valuables sent to OR. Clothing and dentures given to family members.

## 2017-02-16 NOTE — Anesthesia Postprocedure Evaluation (Addendum)
Anesthesia Post Note  Patient: CARICE CONEY  Procedure(s) Performed: Procedure(s) (LRB): CORONARY ARTERY BYPASS GRAFTING (CABG) x 3 (LIMA to LAD, SVG SEQUENTIALLY to RAMUS INTERMEDIATE and DISTAL CIRCUMFLEX) WITH ENDOSCOPIC HARVESTING OF RIGHT SAPHENOUS VEIN (N/A) TRANSESOPHAGEAL ECHOCARDIOGRAM W/O CARDIOVERSION  Patient location during evaluation: ICU Anesthesia Type: General Level of consciousness: sedated Pain management: pain level controlled Vital Signs Assessment: post-procedure vital signs reviewed and stable Respiratory status: patient remains intubated per anesthesia plan Cardiovascular status: stable Anesthetic complications: no       Last Vitals:  Vitals:   02/16/17 1900 02/16/17 1915  BP: (!) 110/54   Pulse: 86 84  Resp: 12 12  Temp: 37.5 C 37.5 C    Last Pain:  Vitals:   02/16/17 1400  TempSrc: Core (Comment)  PainSc:                  Zynia Wojtowicz

## 2017-02-16 NOTE — Brief Op Note (Addendum)
      [address]       [place] [address]             [phone]      02/16/2017  10:07 AM  PATIENT:  Stephanie Hendricks  76 y.o. female  PRE-OPERATIVE DIAGNOSIS:  1. S/p NSTEMI 2.Coronary artery disease (critical left main disease included)  POST-OPERATIVE DIAGNOSIS:  1. S/p NSTEMI 2.Coronary artery disease (critical left main disease included)   PROCEDURE:  TRANSESOPHAGEAL ECHOCARDIOGRAM, EMERGENT CORONARY ARTERY BYPASS GRAFTING (CABG) x 3 (LIMA to LAD, SVG SEQUENTIALLY to RAMUS INTERMEDIATE and DISTAL CIRCUMFLEX) WITH ENDOSCOPIC HARVESTING OF RIGHT SAPHENOUS VEIN   SURGEON:  Surgeon(s) and Role:    * Grace Isaac, MD - Primary  PHYSICIAN ASSISTANT: Lars Pinks PA-C  ANESTHESIA:   general  EBL:  Total I/O In: -  Out: 245 [Urine:245]  BLOOD ADMINISTERED:Two CC PRBC  DRAINS: Chest tubes placed in the mediastinal and pleural spaces   COUNTS CORRECT:  YES  DICTATION: .Dragon Dictation  PLAN OF CARE: Admit to inpatient   PATIENT DISPOSITION:  ICU - intubated and hemodynamically stable.   Delay start of Pharmacological VTE agent (>24hrs) due to surgical blood loss or risk of bleeding: yes  BASELINE WEIGHT: 81 kg

## 2017-02-16 NOTE — Anesthesia Preprocedure Evaluation (Signed)
Anesthesia Evaluation  Patient identified by MRN, date of birth, ID band Patient awake    Reviewed: Allergy & Precautions, NPO status , Patient's Chart, lab work & pertinent test results  Airway Mallampati: II  TM Distance: >3 FB Neck ROM: Full    Dental   Pulmonary    Pulmonary exam normal        Cardiovascular hypertension, Pt. on medications + Past MI  Normal cardiovascular exam+ Valvular Problems/Murmurs AS      Neuro/Psych    GI/Hepatic   Endo/Other  diabetes, Type 2, Oral Hypoglycemic Agents  Renal/GU Renal InsufficiencyRenal disease     Musculoskeletal   Abdominal   Peds  Hematology   Anesthesia Other Findings   Reproductive/Obstetrics                             Anesthesia Physical Anesthesia Plan  ASA: III and emergent  Anesthesia Plan: General   Post-op Pain Management:    Induction: Intravenous  Airway Management Planned: Oral ETT  Additional Equipment: Arterial line, CVP, TEE, PA Cath and Ultrasound Guidance Line Placement  Intra-op Plan:   Post-operative Plan: Post-operative intubation/ventilation  Informed Consent: I have reviewed the patients History and Physical, chart, labs and discussed the procedure including the risks, benefits and alternatives for the proposed anesthesia with the patient or authorized representative who has indicated his/her understanding and acceptance.     Plan Discussed with: CRNA and Surgeon  Anesthesia Plan Comments:         Anesthesia Quick Evaluation

## 2017-02-16 NOTE — Progress Notes (Signed)
14fr right radial sheath aspirated and removed, TR band applied at 11cc. Site level 0 no S+S of hematoma. Sp02 95% as measured in right thumb.

## 2017-02-16 NOTE — ED Notes (Signed)
EMS arrived and is transporting patient to [facility].

## 2017-02-16 NOTE — Consult Note (Signed)
[address]       [place] Hendricks,Stephanie Hendricks             [phone]        Stephanie Hendricks Stephanie Hendricks Date of Birth: 11-10-1941  Referring: Stephanie Hendricks Primary Care: Stephanie Neighbors, Stephanie Hendricks  Chief Complaint:    Chief Complaint  Patient presents with  . Chest Pain    History of Present Illness:     Called to the Cath Lab at 5:30 AM for patient on the cath table the emergency coronary bypass for very high-grade left main obstruction. Patient is a 76 year old female with known diabetes renal insufficiency baseline creatinine 1.5 followed by Stephanie Hendricks for mild aortic stenosis and bradycardia. Was seen by cardiology 12/31/2016.  Patient notes for past 3 or 4 nights around 3 in the morning she has been getting diffuse chest pain that radiates up into her jaw and into her ears and gums. She also states he goes into her upper back. She states her last about one hour. She takes Tylenol which doesn't seem to help.  She describes the pain as pressure. She denies shortness of breath, sweatiness, nausea or vomiting.She states laying down will make it feel worse and sitting up makes it better. She has had swelling in her lower extremities over the last 3-4 years, left leg is much more swollen than the right.  She describes her current pain is a 5 out of 10 but at its worse tonight it was a 6-7 out of 10. She states the pain started somewhere between 1 and 1:30 this morning. She states she's never had this pain before.  Family history she states her sister who is 70 has had a cardiac stent. She states her brother died in his sleep from presumed MI.   Current Activity/ Functional Status: Patient is independent with mobility/ambulation, transfers, ADL's, IADL's.   Zubrod Score: At the time of surgery this patient's most appropriate activity status/level should be described as: []     0    Normal activity, no symptoms [x]     1    Restricted in physical strenuous  activity but ambulatory, able to do out light work []     2    Ambulatory and capable of self care, unable to do work activities, up and about                 more than 50%  Of the time                            []     3    Only limited self care, in bed greater than 50% of waking hours []     4    Completely disabled, no self care, confined to bed or chair []     5    Moribund  Past Medical History:  Diagnosis Date  . Aortic stenosis    Minimal  . Bradycardia   . Chronic kidney disease, stage III (moderate)   . Degenerative joint disease   . Diabetes mellitus type II   . Essential hypertension, benign   . Hyperlipidemia   . Lower extremity edema   . Microcytic anemia   . Palpitations     Past Surgical History:  Procedure Laterality Date  . COLONOSCOPY  08/28/2011   sigmoid colon serated adenoma, next colonoscopy 08/2016  . COLONOSCOPY W/ POLYPECTOMY  2005  Stephanie. Tamela Oddi resected per patient  . ESOPHAGOGASTRODUODENOSCOPY     remote, foreign body extraction  . ESOPHAGOGASTRODUODENOSCOPY  08/28/2011   hiatal hernia, erosion on fold of diaphragmatic hiatus (cameron lesion), antral and prepylori erosions, SB bx negative, gastric bx showed reactive gastropathy but no H.Pylori  . HEMORROIDECTOMY    . TONSILLECTOMY    . TUBAL LIGATION      History  Smoking Status  . Never Smoker  Smokeless Tobacco  . Never Used    History  Alcohol Use No    Social History   Social History  . Marital status: Widowed    Spouse name: N/A  . Number of children: 2  . Years of education: N/A   Occupational History  . Not on file.   Social History Main Topics  . Smoking status: Never Smoker  . Smokeless tobacco: Never Used  . Alcohol use No  . Drug use: No  . Sexual activity: Not on file   Other Topics Concern  . Not on file   Social History Narrative  . No narrative on file    No Known Allergies  Current Facility-Administered Medications  Medication Dose Route Frequency  Provider Last Rate Last Dose  . 0.9 %  sodium chloride infusion   Intravenous Once Stephanie Adela Lank, Stephanie Hendricks      . bivalirudin (ANGIOMAX) 250 mg in sodium chloride 0.9 % 50 mL (5 mg/mL) infusion    Continuous PRN Stephanie Blanks, Stephanie Hendricks   Stopped at 02/16/17 [phone]  . bivalirudin (ANGIOMAX) BOLUS via infusion    PRN Stephanie Blanks, Stephanie Hendricks   60.525 mg at 02/16/17 0446  . cefUROXime (ZINACEF) 1.5 g in dextrose 5 % 50 mL IVPB  1.5 g Intravenous To OR Stephanie Isaac, Stephanie Hendricks      . cefUROXime (ZINACEF) 750 mg in dextrose 5 % 50 mL IVPB  750 mg Intravenous To OR Stephanie Isaac, Stephanie Hendricks      . dexmedetomidine (PRECEDEX) 400 MCG/100ML (4 mcg/mL) infusion  0.1-0.7 mcg/kg/hr Intravenous To OR Stephanie Isaac, Stephanie Hendricks      . DOPamine (INTROPIN) 800 mg in dextrose 5 % 250 mL (3.2 mg/mL) infusion  0-10 mcg/kg/min Intravenous To OR Stephanie Isaac, Stephanie Hendricks      . EPINEPHrine (ADRENALIN) 4 mg in dextrose 5 % 250 mL (0.016 mg/mL) infusion  0-10 mcg/min Intravenous To OR Stephanie Isaac, Stephanie Hendricks      . fentaNYL (SUBLIMAZE) injection    PRN Stephanie Blanks, Stephanie Hendricks   25 mcg at 02/16/17 0444  . heparin 100-0.45 UNIT/ML-% infusion           . heparin 2,500 Units, papaverine 30 mg in electrolyte-148 (PLASMALYTE-148) 500 mL irrigation   Irrigation To OR Stephanie Isaac, Stephanie Hendricks      . heparin 30,000 units/NS 1000 mL solution for CELLSAVER   Other To OR Stephanie Isaac, Stephanie Hendricks      . insulin regular (NOVOLIN R,HUMULIN R) 250 Units in sodium chloride 0.9 % 250 mL (1 Units/mL) infusion   Intravenous To OR Stephanie Isaac, Stephanie Hendricks      . iopamidol (ISOVUE-370) 76 % injection    PRN Stephanie Blanks, Stephanie Hendricks   75 mL at 02/16/17 0550  . lidocaine (PF) (XYLOCAINE) 1 % injection    PRN Stephanie Blanks, Stephanie Hendricks   15 mL at 02/16/17 0443  . magnesium sulfate (IV Push/IM) injection 40 mEq  40 mEq Other To OR Stephanie Isaac, Stephanie Hendricks      .  midazolam (VERSED) injection    PRN Stephanie Blanks, Stephanie Hendricks   1 mg at 02/16/17 0444  . morphine 4  MG/ML injection           . nitroGLYCERIN 0.2 mg/mL in dextrose 5 % infusion           . nitroGLYCERIN 50 mg in dextrose 5 % 250 mL (0.2 mg/mL) infusion  2-200 mcg/min Intravenous To OR Stephanie Isaac, Stephanie Hendricks      . phenylephrine (NEO-SYNEPHRINE) 20 mg in sodium chloride 0.9 % 250 mL (0.08 mg/mL) infusion  30-200 mcg/min Intravenous To OR Stephanie Isaac, Stephanie Hendricks      . potassium chloride injection 80 mEq  80 mEq Other To OR Stephanie Isaac, Stephanie Hendricks      . Radial Cocktail/Verapamil only    PRN Stephanie Blanks, Stephanie Hendricks   10 mL at 02/16/17 0436  . tranexamic acid (CYKLOKAPRON) 2,500 mg in sodium chloride 0.9 % 250 mL (10 mg/mL) infusion  1.5 mg/kg/hr Intravenous To OR Stephanie Isaac, Stephanie Hendricks      . tranexamic acid (CYKLOKAPRON) bolus via infusion - over 30 minutes 1,210.5 mg  15 mg/kg Intravenous To OR Stephanie Isaac, Stephanie Hendricks      . tranexamic acid (CYKLOKAPRON) pump prime solution 161 mg  2 mg/kg Intracatheter To OR Stephanie Isaac, Stephanie Hendricks      . vancomycin (VANCOCIN) 1,250 mg in sodium chloride 0.9 % 250 mL IVPB  1,250 mg Intravenous To OR Stephanie Isaac, Stephanie Hendricks        Prescriptions Prior to Admission  Medication Sig Dispense Refill Last Dose  . amLODipine-valsartan (EXFORGE) 10-320 MG tablet Take 1 tablet by mouth daily. 90 tablet 3   . aspirin 81 MG tablet Take 81 mg by mouth daily.     Taking  . cloNIDine (CATAPRES - DOSED IN MG/24 HR) 0.3 mg/24hr patch APPLY 1 PATCH ONCE WEEKLY. 12 patch 3   . eplerenone (INSPRA) 50 MG tablet Take 50 mg by mouth daily.    Taking  . glipiZIDE (GLIPIZIDE XL) 5 MG 24 hr tablet Take 1 tablet (5 mg total) by mouth daily with breakfast. Do not take this medication if your blood sugar is less than 110.   Taking  . Linagliptin-Metformin HCl (JENTADUETO) 2.5-500 MG TABS Take 1 tablet by mouth 2 (two) times daily. Do not take this medication if your blood sugar is less than 110.   Taking  . simvastatin (ZOCOR) 20 MG tablet TAKE (1) TABLET BY MOUTH ONCE DAILY AT BEDTIME FOR  CHOLESTEROL. 90 tablet 3     Family History  Problem Relation Age of Onset  . Hypertension Father   . Coronary artery disease Sister   . Heart attack Brother     Deceased  . Colon cancer Neg Hx   . Liver disease Neg Hx      Review of Systems:      Cardiac Review of Systems: Y or N  Chest Pain [ x ]  Resting SOB [  n ] Exertional SOB  [ x ]  Stephanie Hendricks  ]   Pedal Edema [ n  ]    Palpitations [n  ] Syncope  [ n ]   Presyncope [ n  ]  General Review of Systems: [Y] = yes [  ]=no Constitional: recent weight change [  ]; anorexia [  ]; fatigue [  ]; nausea [  ]; night sweats [  ]; fever [  ]; or chills [  ]  Dental: poor dentition[  ]; Last Dentist visit:   Eye : blurred vision [  ]; diplopia [   ]; vision changes [  ];  Amaurosis fugax[  ]; Resp: cough [  ];  wheezing[  ];  hemoptysis[  ]; shortness of breath[  ]; paroxysmal nocturnal dyspnea[  ]; dyspnea on exertion[  ]; or orthopnea[  ];  GI:  gallstones[  ], vomiting[  ];  dysphagia[  ]; melena[  ];  hematochezia [  ]; heartburn[  ];   Hx of  Colonoscopy[  ]; GU: kidney stones [  ]; hematuria[  ];   dysuria [  ];  nocturia[  ];  history of     obstruction [  ]; urinary frequency [  ]             Skin: rash, swelling[  ];, hair loss[  ];  peripheral edema[  ];  or itching[  ]; Musculosketetal: myalgias[  ];  joint swelling[  ];  joint erythema[  ];  joint pain[  ];  back pain[  ];  Heme/Lymph: bruising[  ];  bleeding[  ];  anemia[  ];  Neuro: TIA[n  ];  headaches[  ];  stroke[n];  vertigo[  ];  seizures[  ];   paresthesias[  ];  difficulty walking[  ];  Psych:depression[  ]; anxiety[  ];  Endocrine: diabetes[  ];  thyroid dysfunction[  ];  Immunizations: Flu [  ]; Pneumococcal[  ];  Other:  Physical Exam: BP 147/64 (BP Location: Left Arm)   Pulse 82   Temp 97.8 F (36.6 C) (Oral)   Resp 18   Ht 5\' 1"  (1.549 m)   Wt 178 lb (80.7 kg)   SpO2 93%   BMI 33.63 kg/m      General appearance: alert, cooperative and appears stated age Head: Normocephalic, without obvious abnormality, atraumatic Neck: no adenopathy, no carotid bruit, no JVD, supple, symmetrical, trachea midline and thyroid not enlarged, symmetric, no tenderness/mass/nodules Lymph nodes: Cervical, supraclavicular, and axillary nodes normal. Resp: diminished breath sounds bilaterally Back: symmetric, no curvature. ROM normal. No CVA tenderness. Cardio: systolic murmur: early systolic 2/6, crescendo at 2nd left intercostal space GI: soft, non-tender; bowel sounds normal; no masses,  no organomegaly Extremities: extremities , bilateral pedal edema, intra-aortic balloon pump is in the right groin patient cath from the right wrist a line still in place. She has a significant 3+ edema in the left leg 2+ in the right leg. With the edema in the feet pulses are difficult to feel  Neurologic: Grossly normal  Diagnostic Studies & Laboratory data:     Recent Radiology Findings:   Dg Chest Port 1 View  Result Date: 02/16/2017 CLINICAL DATA:  Pain in the upper chest EXAM: PORTABLE CHEST 1 VIEW COMPARISON:  10/20/2007 FINDINGS: Elevation of the left diaphragm. Cannot exclude left basilar atelectasis or infiltrate. Possible tiny left effusion. Mild cardiomegaly without overt failure. Right lung grossly clear. No pneumothorax. IMPRESSION: 1. Elevated left diaphragm. Cannot exclude mild left basilar atelectasis or infiltrate. Possible tiny left effusion 2. Mild cardiomegaly. Electronically Signed   By: Stephanie Hendricks M.D.   On: 02/16/2017 03:30     I have independently reviewed the above radiologic studies.  Recent Lab Findings: Lab Results  Component Value Date   WBC 12.4 (H) 02/16/2017   HGB 11.2 (L) 02/16/2017   HCT 35.8 (L) 02/16/2017   PLT 257 02/16/2017   GLUCOSE 131 (H) 02/16/2017   ALT 11 11/06/2014  AST 13 11/06/2014   NA 141 02/16/2017   K 3.8 02/16/2017   CL 104 02/16/2017   CREATININE  1.53 (H) 02/16/2017   BUN 25 (H) 02/16/2017   CO2 28 02/16/2017   TSH 2.320 11/06/2014   INR 0.94 02/16/2017   HGBA1C 6.5 (H) 11/06/2014   Chronic Kidney Disease   Stage I     GFR >90  Stage II    GFR 60-89  Stage IIIA GFR 45-59  Stage IIIB GFR 30-44  Stage IV   GFR 15-29  Stage V    GFR  <15  Lab Results  Component Value Date   CREATININE 1.53 (H) 02/16/2017   Estimated Creatinine Clearance: 29.2 mL/min (by C-G formula based on SCr of 1.53 mg/dL (H)).  Cath Lab films reviewed with Stephanie. Elenora Fender   Assessment / Plan:     Critical 99% left main disease with reasonable distal targets, additional proximal LAD disease. LV function is unknown but normal on previous echocardiogram 2 years ago, mild AS on echo cardiogram 2 years ago. Now with unstable angina intra-aortic balloon pump in place and need for emergency coronary artery bypass. Patient has underlying stage IV renal disease and is diabetic.  I discussed with her the emergent nature of her current situation and need for emergency coronary artery bypass grafting and possible aortic valve replacement depending on TEE findings at the time of surgery. Patient's family is not here her son has been contacted and is aware of the critical nature of her situation.  The goals risks and alternatives of the planned surgical procedure emergency cabg   have been discussed with the patient in detail. The risks of the procedure including death, infection, stroke, myocardial infarction, bleeding, blood transfusion have all been discussed specifically.  I have quoted Stephanie Hendricks a 10 % of perioperative mortality and a complication rate as high as 70 %. The patient's questions have been answered.Stephanie Hendricks is willing  to proceed with the planned procedure.     Stephanie Isaac Stephanie Hendricks      [address] [place] [address] Office [phone]   Beeper [phone]  02/16/2017 5:51 AM

## 2017-02-17 ENCOUNTER — Inpatient Hospital Stay (HOSPITAL_COMMUNITY): Payer: Medicare Other

## 2017-02-17 LAB — POCT I-STAT 3, ART BLOOD GAS (G3+)
ACID-BASE DEFICIT: 7 mmol/L — AB (ref 0.0–2.0)
ACID-BASE DEFICIT: 5 mmol/L — AB (ref 0.0–2.0)
ACID-BASE DEFICIT: 5 mmol/L — AB (ref 0.0–2.0)
Acid-base deficit: 3 mmol/L — ABNORMAL HIGH (ref 0.0–2.0)
BICARBONATE: 22.2 mmol/L (ref 20.0–28.0)
BICARBONATE: 21.2 mmol/L (ref 20.0–28.0)
Bicarbonate: 22.1 mmol/L (ref 20.0–28.0)
Bicarbonate: 19.2 mmol/L — ABNORMAL LOW (ref 20.0–28.0)
O2 SAT: 90 %
O2 SAT: 89 %
O2 Saturation: 97 %
O2 Saturation: 95 %
PCO2 ART: 42.4 mmHg (ref 32.0–48.0)
PO2 ART: 70 mmHg — AB (ref 83.0–108.0)
PO2 ART: 70 mmHg — AB (ref 83.0–108.0)
Patient temperature: 37.6
TCO2: 23 mmol/L (ref 0–100)
TCO2: 20 mmol/L (ref 0–100)
TCO2: 24 mmol/L (ref 0–100)
TCO2: 22 mmol/L (ref 0–100)
pCO2 arterial: 37.9 mmHg (ref 32.0–48.0)
pCO2 arterial: 51.4 mmHg — ABNORMAL HIGH (ref 32.0–48.0)
pCO2 arterial: 44.7 mmHg (ref 32.0–48.0)
pH, Arterial: 7.371 (ref 7.350–7.450)
pH, Arterial: 7.268 — ABNORMAL LOW (ref 7.350–7.450)
pH, Arterial: 7.247 — ABNORMAL LOW (ref 7.350–7.450)
pH, Arterial: 7.286 — ABNORMAL LOW (ref 7.350–7.450)
pO2, Arterial: 96 mmHg (ref 83.0–108.0)
pO2, Arterial: 90 mmHg (ref 83.0–108.0)

## 2017-02-17 LAB — BASIC METABOLIC PANEL
ANION GAP: 7 (ref 5–15)
BUN: 12 mg/dL (ref 6–20)
CALCIUM: 7.7 mg/dL — AB (ref 8.9–10.3)
CHLORIDE: 111 mmol/L (ref 101–111)
CO2: 22 mmol/L (ref 22–32)
Creatinine, Ser: 1.14 mg/dL — ABNORMAL HIGH (ref 0.44–1.00)
GFR calc non Af Amer: 45 mL/min — ABNORMAL LOW (ref >60)
GFR, EST AFRICAN AMERICAN: 52 mL/min — AB (ref >60)
GLUCOSE: 98 mg/dL (ref 65–99)
POTASSIUM: 4.9 mmol/L (ref 3.5–5.1)
Sodium: 140 mmol/L (ref 135–145)

## 2017-02-17 LAB — CBC
HCT: 28 % — ABNORMAL LOW (ref 36.0–46.0)
HEMATOCRIT: 30 % — AB (ref 36.0–46.0)
HEMOGLOBIN: 9.6 g/dL — AB (ref 12.0–15.0)
Hemoglobin: 9 g/dL — ABNORMAL LOW (ref 12.0–15.0)
MCH: 24.6 pg — ABNORMAL LOW (ref 26.0–34.0)
MCH: 24.7 pg — ABNORMAL LOW (ref 26.0–34.0)
MCHC: 32 g/dL (ref 30.0–36.0)
MCHC: 32.1 g/dL (ref 30.0–36.0)
MCV: 76.7 fL — AB (ref 78.0–100.0)
MCV: 76.9 fL — AB (ref 78.0–100.0)
PLATELETS: 64 10*3/uL — AB (ref 150–400)
Platelets: 84 10*3/uL — ABNORMAL LOW (ref 150–400)
RBC: 3.91 MIL/uL (ref 3.87–5.11)
RBC: 3.64 MIL/uL — AB (ref 3.87–5.11)
RDW: 14.3 % (ref 11.5–15.5)
RDW: 14.6 % (ref 11.5–15.5)
WBC: 11.3 10*3/uL — AB (ref 4.0–10.5)
WBC: 13.4 10*3/uL — ABNORMAL HIGH (ref 4.0–10.5)

## 2017-02-17 LAB — GLUCOSE, CAPILLARY
GLUCOSE-CAPILLARY: 106 mg/dL — AB (ref 65–99)
GLUCOSE-CAPILLARY: 108 mg/dL — AB (ref 65–99)
GLUCOSE-CAPILLARY: 126 mg/dL — AB (ref 65–99)
GLUCOSE-CAPILLARY: 136 mg/dL — AB (ref 65–99)
GLUCOSE-CAPILLARY: 140 mg/dL — AB (ref 65–99)
GLUCOSE-CAPILLARY: 79 mg/dL (ref 65–99)
Glucose-Capillary: 80 mg/dL (ref 65–99)
Glucose-Capillary: 91 mg/dL (ref 65–99)
Glucose-Capillary: 108 mg/dL — ABNORMAL HIGH (ref 65–99)
Glucose-Capillary: 101 mg/dL — ABNORMAL HIGH (ref 65–99)
Glucose-Capillary: 106 mg/dL — ABNORMAL HIGH (ref 65–99)
Glucose-Capillary: 94 mg/dL (ref 65–99)
Glucose-Capillary: 109 mg/dL — ABNORMAL HIGH (ref 65–99)
Glucose-Capillary: 113 mg/dL — ABNORMAL HIGH (ref 65–99)
Glucose-Capillary: 118 mg/dL — ABNORMAL HIGH (ref 65–99)
Glucose-Capillary: 139 mg/dL — ABNORMAL HIGH (ref 65–99)
Glucose-Capillary: 144 mg/dL — ABNORMAL HIGH (ref 65–99)
Glucose-Capillary: 149 mg/dL — ABNORMAL HIGH (ref 65–99)

## 2017-02-17 LAB — CREATININE, SERUM
CREATININE: 1.3 mg/dL — AB (ref 0.44–1.00)
GFR, EST AFRICAN AMERICAN: 44 mL/min — AB (ref >60)
GFR, EST NON AFRICAN AMERICAN: 38 mL/min — AB (ref >60)

## 2017-02-17 LAB — COOXEMETRY PANEL
Carboxyhemoglobin: 1.3 % (ref 0.5–1.5)
Methemoglobin: 0.7 % (ref 0.0–1.5)
O2 Saturation: 65.1 %
Total hemoglobin: 8.9 g/dL — ABNORMAL LOW (ref 12.0–16.0)

## 2017-02-17 LAB — MAGNESIUM
MAGNESIUM: 2.4 mg/dL (ref 1.7–2.4)
Magnesium: 2.4 mg/dL (ref 1.7–2.4)

## 2017-02-17 MED ORDER — ORAL CARE MOUTH RINSE
15.0000 mL | OROMUCOSAL | Status: DC
Start: 2017-02-17 — End: 2017-02-19
  Administered 2017-02-17 – 2017-02-19 (×20): 15 mL via OROMUCOSAL

## 2017-02-17 MED ORDER — SODIUM BICARBONATE 8.4 % IV SOLN
50.0000 meq | Freq: Once | INTRAVENOUS | Status: AC
  Administered 2017-02-17: 50 meq via INTRAVENOUS

## 2017-02-17 MED ORDER — ALBUMIN HUMAN 5 % IV SOLN
12.5000 g | Freq: Once | INTRAVENOUS | Status: AC
Start: 2017-02-17 — End: 2017-02-17
  Administered 2017-02-17: 12.5 g via INTRAVENOUS

## 2017-02-17 MED ORDER — CHLORHEXIDINE GLUCONATE 0.12% ORAL RINSE (MEDLINE KIT)
15.0000 mL | Freq: Two times a day (BID) | OROMUCOSAL | Status: DC
  Administered 2017-02-17 – 2017-02-21 (×10): 15 mL via OROMUCOSAL

## 2017-02-17 NOTE — Progress Notes (Signed)
Patient ID: Stephanie Hendricks, female   DOB: 12-11-1941, 76 y.o.   MRN: [id number] TCTS DAILY ICU PROGRESS NOTE                   [address]            [place] [address]          [phone]   1 Day Post-Op Procedure(s) (LRB): CORONARY ARTERY BYPASS GRAFTING (CABG) x 3 (LIMA to LAD, SVG SEQUENTIALLY to RAMUS INTERMEDIATE and DISTAL CIRCUMFLEX) WITH ENDOSCOPIC HARVESTING OF RIGHT SAPHENOUS VEIN (N/A) TRANSESOPHAGEAL ECHOCARDIOGRAM W/O CARDIOVERSION  Total Length of Stay:  LOS: 1 day   Subjective: Remains on vent , opens eyes and follows commands   Objective: Vital signs in last 24 hours: Temp:  [92.5 F (33.6 C)-99.7 F (37.6 C)] 98.1 F (36.7 C) (02/25 0745) Pulse Rate:  [42-143] 44 (02/25 0745) Cardiac Rhythm: Atrial paced (02/25 0800) Resp:  [9-16] 12 (02/25 0745) BP: (97-125)/(39-95) 125/95 (02/25 0800) SpO2:  [95 %-100 %] 99 % (02/25 0745) Arterial Line BP: (92-136)/(43-67) 115/54 (02/25 0745) FiO2 (%):  [40 %-50 %] 50 % (02/25 0800) Weight:  [198 lb 3.1 oz (89.9 kg)] 198 lb 3.1 oz (89.9 kg) (02/25 0630)  Filed Weights   02/16/17 0234 02/17/17 0630  Weight: 178 lb (80.7 kg) 198 lb 3.1 oz (89.9 kg)    Weight change: 20 lb 3.1 oz (9.16 kg)   Hemodynamic parameters for last 24 hours: PAP: (30-51)/(15-34) 35/19 CO:  [3.4 L/min-5 L/min] 3.5 L/min CI:  [1.9 L/min/m2-2.8 L/min/m2] 1.9 L/min/m2  Intake/Output from previous day: 02/24 0701 - 02/25 0700 In: 6262.8 [I.V.:4092.8; Blood:595; NG/GT:60; IV Piggyback:1515] Out: 0962 [Urine:1885; Emesis/NG output:1083; Blood:1520; Chest Tube:330]  Intake/Output this shift: Total I/O In: 30 [NG/GT:30] Out: 100 [Urine:70; Chest Tube:30]  Current Meds: Scheduled Meds: . acetaminophen  1,000 mg Oral Q6H   Or  . acetaminophen (TYLENOL) oral liquid 160 mg/5 mL  1,000 mg Per Tube Q6H  . aspirin EC  325 mg Oral Daily   Or  . aspirin  324 mg Per Tube Daily  . atorvastatin  40 mg Oral q1800  . bisacodyl  10 mg  Oral Daily   Or  . bisacodyl  10 mg Rectal Daily  . cefUROXime (ZINACEF)  IV  1.5 g Intravenous Q12H  . chlorhexidine gluconate (MEDLINE KIT)  15 mL Mouth Rinse BID  . docusate sodium  200 mg Oral Daily  . insulin regular  0-10 Units Intravenous TID WC  . mouth rinse  15 mL Mouth Rinse QID  . metoprolol tartrate  12.5 mg Oral BID   Or  . metoprolol tartrate  12.5 mg Per Tube BID  . [START ON 02/18/2017] pantoprazole  40 mg Oral Daily  . sodium chloride flush  3 mL Intravenous Q12H   Continuous Infusions: . sodium chloride 20 mL/hr at 02/17/17 0700  . sodium chloride    . sodium chloride 10 mL/hr at 02/17/17 0700  . dexmedetomidine 0.4 mcg/kg/hr (02/17/17 0700)  . DOPamine 2.511 mcg/kg/min (02/17/17 0700)  . insulin (NOVOLIN-R) infusion 1 Units/hr (02/17/17 0835)  . lactated ringers 10 mL/hr at 02/17/17 0700  . lactated ringers 20 mL/hr at 02/17/17 0700  . milrinone 0.25 mcg/kg/min (02/17/17 0700)  . nitroGLYCERIN Stopped (02/16/17 1300)  . phenylephrine (NEO-SYNEPHRINE) Adult infusion 25.067 mcg/min (02/17/17 0700)   PRN Meds:.sodium chloride, albumin human, lactated ringers, metoprolol, midazolam, morphine injection, ondansetron (ZOFRAN) IV, oxyCODONE, sodium chloride flush, traMADol  General appearance:  cooperative, appears older than stated age and sedated on vent  Neurologic: intact Heart: regular rate and rhythm, S1, S2 normal, no murmur, click, rub or gallop Lungs: diminished breath sounds bibasilar Abdomen: soft, non-tender; bowel sounds normal; no masses,  no organomegaly Extremities: extremities normal, atraumatic, no cyanosis or edema, Homans sign is negative, no sign of DVT and iab in right groin, doppler pulses in both feet, feet warm, right radial cath removed last pm Wound: sternum intact, dressing in place   Lab Results: CBC:  Recent Labs  02/16/17 1756 02/16/17 1802 02/17/17 0330  WBC 8.9  --  11.3*  HGB 10.7* 11.2* 9.6*  HCT 33.4* 33.0* 30.0*  PLT 92*   --  84*   BMET:  Recent Labs  02/16/17 0242  02/16/17 1802 02/17/17 0330  NA 141  < > 142 140  K 3.8  < > 4.8 4.9  CL 104  < > 111 111  CO2 28  --   --  22  GLUCOSE 131*  < > 124* 98  BUN 25*  < > 17 12  CREATININE 1.53*  < > 1.00 1.14*  CALCIUM 9.7  --   --  7.7*  < > = values in this interval not displayed.  CMET: Lab Results  Component Value Date   WBC 11.3 (H) 02/17/2017   HGB 9.6 (L) 02/17/2017   HCT 30.0 (L) 02/17/2017   PLT 84 (L) 02/17/2017   GLUCOSE 98 02/17/2017   CHOL 70 02/16/2017   TRIG 62 02/16/2017   HDL 22 (L) 02/16/2017   LDLCALC 36 02/16/2017   ALT 11 11/06/2014   AST 13 11/06/2014   NA 140 02/17/2017   K 4.9 02/17/2017   CL 111 02/17/2017   CREATININE 1.14 (H) 02/17/2017   BUN 12 02/17/2017   CO2 22 02/17/2017   TSH 0.785 02/16/2017   INR 1.52 02/16/2017   HGBA1C 6.5 (H) 11/06/2014      PT/INR:   Recent Labs  02/16/17 1239  LABPROT 18.4*  INR 1.52   Radiology: Dg Chest Port 1 View  Result Date: 02/16/2017 CLINICAL DATA:  Status post CABG. EXAM: PORTABLE CHEST 1 VIEW COMPARISON:  02/16/2017 FINDINGS: Interval median sternotomy. There is an intra-aortic balloon a pump with marker 1.4 cm below the aortic arch. ET tube tip is above the carina. Swan-Ganz catheter tip is in the main pulmonary artery. ET tube tip is above the carina. Nasogastric tube is noted with side port below GE junction left chest tube and mediastinal drain are in place. No pneumothorax identified. Cardiac enlargement, pulmonary edema and small pleural effusions are noted. IMPRESSION: 1. Postoperative changes as described above. 2. Pleural effusions and edema compatible with CHF. Electronically Signed   By: Kerby Moors M.D.   On: 02/16/2017 13:17   IAB advanced after noted to be pulled back on this am chest xray, no report yet  Assessment/Plan: S/P Procedure(s) (LRB): CORONARY ARTERY BYPASS GRAFTING (CABG) x 3 (LIMA to LAD, SVG SEQUENTIALLY to RAMUS INTERMEDIATE and  DISTAL CIRCUMFLEX) WITH ENDOSCOPIC HARVESTING OF RIGHT SAPHENOUS VEIN (N/A) TRANSESOPHAGEAL ECHOCARDIOGRAM W/O CARDIOVERSION Expected Acute  Blood - loss Anemia Thombycytopenia, avoid heparin for now Wean vent as tolerated and plan extubation  chronic preop renal disease , but stable currently    Grace Isaac 02/17/2017 8:48 AM

## 2017-02-17 NOTE — Progress Notes (Signed)
Patient ID: Stephanie Hendricks, female   DOB: 1940-12-27, 76 y.o.   MRN: [id number] EVENING ROUNDS NOTE :     [address]       [place] [address]             [phone]                 1 Day Post-Op Procedure(s) (LRB): CORONARY ARTERY BYPASS GRAFTING (CABG) x 3 (LIMA to LAD, SVG SEQUENTIALLY to RAMUS INTERMEDIATE and DISTAL CIRCUMFLEX) WITH ENDOSCOPIC HARVESTING OF RIGHT SAPHENOUS VEIN (N/A) TRANSESOPHAGEAL ECHOCARDIOGRAM W/O CARDIOVERSION  Total Length of Stay:  LOS: 1 day  BP (!) 158/54   Pulse (!) 43   Temp 99.9 F (37.7 C)   Resp 12   Ht 5\' 1"  (1.549 m)   Wt 198 lb 3.1 oz (89.9 kg)   SpO2 100%   BMI 37.45 kg/m   .Intake/Output      02/25 0701 - 02/26 0700   I.V. (mL/kg) 766.1 (8.5)   Blood    NG/GT 120   IV Piggyback 300   Total Intake(mL/kg) 1186.1 (13.2)   Urine (mL/kg/hr) 370 (0.3)   Emesis/NG output 200 (0.2)   Blood    Chest Tube 300 (0.3)   Total Output 870   Net +316.1         . sodium chloride 20 mL/hr (02/17/17 1743)  . sodium chloride    . sodium chloride 10 mL/hr (02/17/17 1150)  . dexmedetomidine Stopped (02/17/17 1300)  . DOPamine 3 mcg/kg/min (02/17/17 1444)  . insulin (NOVOLIN-R) infusion 1 Units/hr (02/17/17 0835)  . lactated ringers 10 mL/hr at 02/17/17 0700  . lactated ringers 20 mL/hr at 02/17/17 0700  . milrinone 0.25 mcg/kg/min (02/17/17 1701)  . nitroGLYCERIN Stopped (02/16/17 1300)  . phenylephrine (NEO-SYNEPHRINE) Adult infusion 10 mcg/min (02/17/17 1851)     Lab Results  Component Value Date   WBC 13.4 (H) 02/17/2017   HGB 9.0 (L) 02/17/2017   HCT 28.0 (L) 02/17/2017   PLT 64 (L) 02/17/2017   GLUCOSE 98 02/17/2017   CHOL 70 02/16/2017   TRIG 62 02/16/2017   HDL 22 (L) 02/16/2017   LDLCALC 36 02/16/2017   ALT 11 11/06/2014   AST 13 11/06/2014   NA 140 02/17/2017   K 4.9 02/17/2017   CL 111 02/17/2017   CREATININE 1.30 (H) 02/17/2017   BUN 12 02/17/2017   CO2 22 02/17/2017   TSH 0.785 02/16/2017   INR 1.52 02/16/2017   HGBA1C 6.5 (H) 11/06/2014   More alert, failed extubation attempts today, will try in am iab 1:2  Cr 1.3   Grace Isaac MD  Beeper [phone] Office [phone] 02/17/2017 7:37 PM

## 2017-02-17 NOTE — Progress Notes (Signed)
Rapid wean protocol initiated at 10:25

## 2017-02-17 NOTE — Progress Notes (Signed)
Patient ABG results showing patient to be acidotic even with an amp of bicarb.  Place patient back on full support on original settings prior to weaning.  Tolerating well.  Will continue to monitor.

## 2017-02-18 ENCOUNTER — Inpatient Hospital Stay (HOSPITAL_COMMUNITY): Payer: Medicare Other

## 2017-02-18 ENCOUNTER — Encounter (HOSPITAL_COMMUNITY): Payer: Self-pay | Admitting: Cardiovascular Disease

## 2017-02-18 LAB — POCT I-STAT 3, ART BLOOD GAS (G3+)
ACID-BASE DEFICIT: 5 mmol/L — AB (ref 0.0–2.0)
BICARBONATE: 21.7 mmol/L (ref 20.0–28.0)
O2 Saturation: 88 %
PH ART: 7.269 — AB (ref 7.350–7.450)
TCO2: 23 mmol/L (ref 0–100)
pCO2 arterial: 47.5 mmHg (ref 32.0–48.0)
pO2, Arterial: 64 mmHg — ABNORMAL LOW (ref 83.0–108.0)

## 2017-02-18 LAB — GLUCOSE, CAPILLARY
GLUCOSE-CAPILLARY: 103 mg/dL — AB (ref 65–99)
GLUCOSE-CAPILLARY: 105 mg/dL — AB (ref 65–99)
GLUCOSE-CAPILLARY: 112 mg/dL — AB (ref 65–99)
GLUCOSE-CAPILLARY: 112 mg/dL — AB (ref 65–99)
GLUCOSE-CAPILLARY: 117 mg/dL — AB (ref 65–99)
GLUCOSE-CAPILLARY: 121 mg/dL — AB (ref 65–99)
GLUCOSE-CAPILLARY: 122 mg/dL — AB (ref 65–99)
GLUCOSE-CAPILLARY: 125 mg/dL — AB (ref 65–99)
GLUCOSE-CAPILLARY: 126 mg/dL — AB (ref 65–99)
GLUCOSE-CAPILLARY: 137 mg/dL — AB (ref 65–99)
GLUCOSE-CAPILLARY: 139 mg/dL — AB (ref 65–99)
GLUCOSE-CAPILLARY: 139 mg/dL — AB (ref 65–99)
GLUCOSE-CAPILLARY: 141 mg/dL — AB (ref 65–99)
GLUCOSE-CAPILLARY: 153 mg/dL — AB (ref 65–99)
Glucose-Capillary: 109 mg/dL — ABNORMAL HIGH (ref 65–99)
Glucose-Capillary: 117 mg/dL — ABNORMAL HIGH (ref 65–99)
Glucose-Capillary: 125 mg/dL — ABNORMAL HIGH (ref 65–99)
Glucose-Capillary: 127 mg/dL — ABNORMAL HIGH (ref 65–99)
Glucose-Capillary: 132 mg/dL — ABNORMAL HIGH (ref 65–99)
Glucose-Capillary: 133 mg/dL — ABNORMAL HIGH (ref 65–99)
Glucose-Capillary: 137 mg/dL — ABNORMAL HIGH (ref 65–99)
Glucose-Capillary: 139 mg/dL — ABNORMAL HIGH (ref 65–99)
Glucose-Capillary: 157 mg/dL — ABNORMAL HIGH (ref 65–99)

## 2017-02-18 LAB — BASIC METABOLIC PANEL
Anion gap: 4 — ABNORMAL LOW (ref 5–15)
BUN: 12 mg/dL (ref 6–20)
CALCIUM: 7.7 mg/dL — AB (ref 8.9–10.3)
CO2: 23 mmol/L (ref 22–32)
CREATININE: 1.32 mg/dL — AB (ref 0.44–1.00)
Chloride: 112 mmol/L — ABNORMAL HIGH (ref 101–111)
GFR calc Af Amer: 44 mL/min — ABNORMAL LOW (ref >60)
GFR calc non Af Amer: 38 mL/min — ABNORMAL LOW (ref >60)
GLUCOSE: 117 mg/dL — AB (ref 65–99)
Potassium: 4.5 mmol/L (ref 3.5–5.1)
Sodium: 139 mmol/L (ref 135–145)

## 2017-02-18 LAB — COOXEMETRY PANEL
Carboxyhemoglobin: 0.9 % (ref 0.5–1.5)
Carboxyhemoglobin: 1.4 % (ref 0.5–1.5)
Methemoglobin: 1.2 % (ref 0.0–1.5)
Methemoglobin: 0.8 % (ref 0.0–1.5)
O2 Saturation: 68.9 %
O2 Saturation: 66.5 %
Total hemoglobin: 8 g/dL — ABNORMAL LOW (ref 12.0–16.0)
Total hemoglobin: 8 g/dL — ABNORMAL LOW (ref 12.0–16.0)

## 2017-02-18 LAB — CBC
HCT: 25.1 % — ABNORMAL LOW (ref 36.0–46.0)
Hemoglobin: 8 g/dL — ABNORMAL LOW (ref 12.0–15.0)
MCH: 24.8 pg — ABNORMAL LOW (ref 26.0–34.0)
MCHC: 31.9 g/dL (ref 30.0–36.0)
MCV: 78 fL (ref 78.0–100.0)
Platelets: 52 10*3/uL — ABNORMAL LOW (ref 150–400)
RBC: 3.22 MIL/uL — ABNORMAL LOW (ref 3.87–5.11)
RDW: 15.4 % (ref 11.5–15.5)
WBC: 10.6 10*3/uL — ABNORMAL HIGH (ref 4.0–10.5)

## 2017-02-18 LAB — POCT ACTIVATED CLOTTING TIME: Activated Clotting Time: 142 seconds

## 2017-02-18 MED ORDER — LEVALBUTEROL HCL 0.63 MG/3ML IN NEBU
0.6300 mg | INHALATION_SOLUTION | Freq: Four times a day (QID) | RESPIRATORY_TRACT | Status: DC
  Administered 2017-02-18 – 2017-02-19 (×6): 0.63 mg via RESPIRATORY_TRACT
  Filled 2017-02-18 (×6): qty 3

## 2017-02-18 MED ORDER — CHLORHEXIDINE GLUCONATE 0.12 % MT SOLN
OROMUCOSAL | Status: AC
  Filled 2017-02-18: qty 15

## 2017-02-18 MED ORDER — ASPIRIN EC 81 MG PO TBEC
81.0000 mg | DELAYED_RELEASE_TABLET | Freq: Every day | ORAL | Status: DC
  Administered 2017-02-20 – 2017-02-21 (×2): 81 mg via ORAL
  Filled 2017-02-18 (×2): qty 1

## 2017-02-18 MED ORDER — LEVALBUTEROL HCL 0.63 MG/3ML IN NEBU
INHALATION_SOLUTION | RESPIRATORY_TRACT | Status: AC
  Administered 2017-02-18: 0.63 mg via RESPIRATORY_TRACT
  Filled 2017-02-18: qty 3

## 2017-02-18 MED ORDER — ASPIRIN 81 MG PO CHEW
81.0000 mg | CHEWABLE_TABLET | Freq: Every day | ORAL | Status: DC
  Administered 2017-02-19: 81 mg
  Filled 2017-02-18: qty 1

## 2017-02-18 MED ORDER — FUROSEMIDE 10 MG/ML IJ SOLN
40.0000 mg | Freq: Once | INTRAMUSCULAR | Status: AC
  Administered 2017-02-18: 40 mg via INTRAVENOUS
  Filled 2017-02-18: qty 4

## 2017-02-18 MED ORDER — SODIUM BICARBONATE 8.4 % IV SOLN
INTRAVENOUS | Status: AC
  Filled 2017-02-18: qty 50

## 2017-02-18 MED ORDER — SODIUM BICARBONATE 8.4 % IV SOLN
25.0000 meq | Freq: Once | INTRAVENOUS | Status: AC
  Administered 2017-02-18: 25 meq via INTRAVENOUS

## 2017-02-18 MED FILL — Heparin Sodium (Porcine) Inj 1000 Unit/ML: INTRAMUSCULAR | Qty: 10 | Status: AC

## 2017-02-18 MED FILL — Heparin Sodium (Porcine) 2 Unit/ML in Sodium Chloride 0.9%: INTRAMUSCULAR | Qty: 500 | Status: AC

## 2017-02-18 MED FILL — Sodium Chloride IV Soln 0.9%: INTRAVENOUS | Qty: 2000 | Status: AC

## 2017-02-18 MED FILL — Heparin Sodium (Porcine) Inj 1000 Unit/ML: INTRAMUSCULAR | Qty: 30 | Status: AC

## 2017-02-18 MED FILL — Mannitol IV Soln 20%: INTRAVENOUS | Qty: 500 | Status: AC

## 2017-02-18 MED FILL — Potassium Chloride Inj 2 mEq/ML: INTRAVENOUS | Qty: 40 | Status: AC

## 2017-02-18 MED FILL — Magnesium Sulfate Inj 50%: INTRAMUSCULAR | Qty: 10 | Status: AC

## 2017-02-18 MED FILL — Lidocaine HCl IV Inj 20 MG/ML: INTRAVENOUS | Qty: 5 | Status: AC

## 2017-02-18 MED FILL — Electrolyte-R (PH 7.4) Solution: INTRAVENOUS | Qty: 4000 | Status: AC

## 2017-02-18 NOTE — Progress Notes (Signed)
02/18/2017 1:04 PM Dr. Servando Snare at bedside, verbal order received to d/c IABP. Cath lab contacted and made aware. ACT already collected per protocol. Will continue to closely monitor patient.  Shakeena Kafer, Arville Lime

## 2017-02-18 NOTE — Progress Notes (Signed)
Patient ID: Stephanie Hendricks, female   DOB: 1941-06-12, 76 y.o.   MRN: [id number] TCTS DAILY ICU PROGRESS NOTE                   [address]            [place] [address]          [phone]   2 Days Post-Op Procedure(s) (LRB): CORONARY ARTERY BYPASS GRAFTING (CABG) x 3 (LIMA to LAD, SVG SEQUENTIALLY to RAMUS INTERMEDIATE and DISTAL CIRCUMFLEX) WITH ENDOSCOPIC HARVESTING OF RIGHT SAPHENOUS VEIN (N/A) TRANSESOPHAGEAL ECHOCARDIOGRAM W/O CARDIOVERSION  Total Length of Stay:  LOS: 2 days   Subjective: Patient awake , neuro intact, tried to wean vent but low vc, low nif   Objective: Vital signs in last 24 hours: Temp:  [98.6 F (37 C)-100.2 F (37.9 C)] 98.6 F (37 C) (02/26 1130) Pulse Rate:  [33-88] 43 (02/26 1130) Cardiac Rhythm: Atrial paced (02/26 0900) Resp:  [0-33] 10 (02/26 1130) BP: (96-146)/(42-68) 127/58 (02/26 1126) SpO2:  [94 %-100 %] 97 % (02/26 1130) Arterial Line BP: (91-149)/(40-61) 97/47 (02/26 1130) FiO2 (%):  [40 %-50 %] 40 % (02/26 1126) Weight:  [201 lb 8 oz (91.4 kg)] 201 lb 8 oz (91.4 kg) (02/26 0500)  Filed Weights   02/16/17 0234 02/17/17 0630 02/18/17 0500  Weight: 178 lb (80.7 kg) 198 lb 3.1 oz (89.9 kg) 201 lb 8 oz (91.4 kg)    Weight change: 3 lb 4.9 oz (1.5 kg)   Hemodynamic parameters for last 24 hours: PAP: (33-54)/(15-26) 40/20 CO:  [4.9 L/min-6.7 L/min] 4.9 L/min CI:  [2.7 L/min/m2-3.7 L/min/m2] 2.7 L/min/m2  Intake/Output from previous day: 02/25 0701 - 02/26 0700 In: 2347.8 [I.V.:1817.8; NG/GT:180; IV Piggyback:350] Out: 1505 [Urine:715; Emesis/NG output:200; Chest Tube:590]  Intake/Output this shift: Total I/O In: 282.9 [I.V.:252.9; NG/GT:30] Out: 225 [Urine:145; Chest Tube:80]  Current Meds: Scheduled Meds: . acetaminophen  1,000 mg Oral Q6H   Or  . acetaminophen (TYLENOL) oral liquid 160 mg/5 mL  1,000 mg Per Tube Q6H  . aspirin EC  325 mg Oral Daily   Or  . aspirin  324 mg Per Tube Daily  . atorvastatin   40 mg Oral q1800  . bisacodyl  10 mg Oral Daily   Or  . bisacodyl  10 mg Rectal Daily  . chlorhexidine gluconate (MEDLINE KIT)  15 mL Mouth Rinse BID  . docusate sodium  200 mg Oral Daily  . insulin regular  0-10 Units Intravenous TID WC  . levalbuterol  0.63 mg Nebulization Q6H  . mouth rinse  15 mL Mouth Rinse 10 times per day  . metoprolol tartrate  12.5 mg Oral BID   Or  . metoprolol tartrate  12.5 mg Per Tube BID  . pantoprazole  40 mg Oral Daily  . sodium chloride flush  3 mL Intravenous Q12H   Continuous Infusions: . sodium chloride 20 mL/hr at 02/18/17 1100  . sodium chloride    . sodium chloride 10 mL/hr at 02/18/17 0500  . dexmedetomidine 0.4 mcg/kg/hr (02/18/17 1128)  . DOPamine 3 mcg/kg/min (02/18/17 1100)  . insulin (NOVOLIN-R) infusion Stopped (02/18/17 0905)  . lactated ringers 10 mL/hr at 02/17/17 0700  . lactated ringers 20 mL/hr at 02/18/17 1100  . milrinone 0.25 mcg/kg/min (02/18/17 1100)  . nitroGLYCERIN Stopped (02/16/17 1300)  . phenylephrine (NEO-SYNEPHRINE) Adult infusion 25 mcg/min (02/18/17 1137)   PRN Meds:.sodium chloride, lactated ringers, metoprolol, midazolam, morphine injection, ondansetron (ZOFRAN) IV, oxyCODONE, sodium  chloride flush, traMADol  General appearance: alert, cooperative and no distress Neurologic: intact Heart: regular rate and rhythm, S1, S2 normal, no murmur, click, rub or gallop Lungs: diminished breath sounds bibasilar Abdomen: soft, non-tender; bowel sounds normal; no masses,  no organomegaly Extremities: extremities normal, atraumatic, no cyanosis or edema, Homans sign is negative, no sign of DVT and doppler pulses in both feet, fet warm and neuro intact Wound: sterum stable  Lab Results: CBC: Recent Labs  02/17/17 1604 02/18/17 0307  WBC 13.4* 10.6*  HGB 9.0* 8.0*  HCT 28.0* 25.1*  PLT 64* 52*   BMET:  Recent Labs  02/17/17 0330 02/17/17 1604 02/18/17 0307  NA 140  --  139  K 4.9  --  4.5  CL 111  --  112*   CO2 22  --  23  GLUCOSE 98  --  117*  BUN 12  --  12  CREATININE 1.14* 1.30* 1.32*  CALCIUM 7.7*  --  7.7*    CMET: Lab Results  Component Value Date   WBC 10.6 (H) 02/18/2017   HGB 8.0 (L) 02/18/2017   HCT 25.1 (L) 02/18/2017   PLT 52 (L) 02/18/2017   GLUCOSE 117 (H) 02/18/2017   CHOL 70 02/16/2017   TRIG 62 02/16/2017   HDL 22 (L) 02/16/2017   LDLCALC 36 02/16/2017   ALT 11 11/06/2014   AST 13 11/06/2014   NA 139 02/18/2017   K 4.5 02/18/2017   CL 112 (H) 02/18/2017   CREATININE 1.32 (H) 02/18/2017   BUN 12 02/18/2017   CO2 23 02/18/2017   TSH 0.785 02/16/2017   INR 1.52 02/16/2017   HGBA1C 6.5 (H) 11/06/2014      PT/INR:  Recent Labs  02/16/17 1239  LABPROT 18.4*  INR 1.52   Radiology: Dg Chest Port 1 View  Result Date: 02/18/2017 CLINICAL DATA:  CABG. EXAM: PORTABLE CHEST 1 VIEW COMPARISON:  02/17/2017. FINDINGS: Endotracheal tube, NG tube, Swan-Ganz catheter, left chest tube, mediastinal drainage catheter in stable position. Aortic balloon pump tip noted just below the aortic arch. Prior CABG. Cardiomegaly with diffuse bilateral mild interstitial prominence, improved from prior exam. Findings suggesting improving congestive heart failure . Small left pleural effusion. No pneumothorax. IMPRESSION: 1. Aortic balloon catheter tip noted just below the aortic arch. Remaining lines and tubes including left chest tube in stable position. No pneumothorax. 2. Prior CABG. Cardiomegaly. Persistent but improving interstitial prominence consistent with improving CHF. Electronically Signed   By: Marcello Moores  Register   On: 02/18/2017 07:02     Assessment/Plan: S/P Procedure(s) (LRB): CORONARY ARTERY BYPASS GRAFTING (CABG) x 3 (LIMA to LAD, SVG SEQUENTIALLY to RAMUS INTERMEDIATE and DISTAL CIRCUMFLEX) WITH ENDOSCOPIC HARVESTING OF RIGHT SAPHENOUS VEIN (N/A) TRANSESOPHAGEAL ECHOCARDIOGRAM W/O CARDIOVERSION Neuro - intact,  Resp- still on vent , weaning  mechanics so not extubated  yet  On minimal drips , CI and COX adequate - IAB on 1:2 will decrease to 1:3 and if stable remove IAB today Thrombocytopenia - avoiding heparin for now, pas hose for dvt pro. Renal function stable - diuresis some today   Grace Isaac 02/18/2017 11:48 AM

## 2017-02-18 NOTE — Progress Notes (Signed)
02/18/2017 1345 Cath lab staff at bedside and IABP removed. Pt. Tolerated well. Will continue to monitor. Noel Rodier, Arville Lime

## 2017-02-18 NOTE — Progress Notes (Signed)
Progress Note  Patient Name: Stephanie Hendricks Date of Encounter: 02/18/2017  Primary Cardiologist: Dr. Domenic Polite  Subjective   Intubated and sedated for IABP pull.    Inpatient Medications    Scheduled Meds: . acetaminophen  1,000 mg Oral Q6H   Or  . acetaminophen (TYLENOL) oral liquid 160 mg/5 mL  1,000 mg Per Tube Q6H  . [START ON 02/19/2017] aspirin EC  81 mg Oral Daily   Or  . [START ON 02/19/2017] aspirin  81 mg Per Tube Daily  . atorvastatin  40 mg Oral q1800  . bisacodyl  10 mg Oral Daily   Or  . bisacodyl  10 mg Rectal Daily  . chlorhexidine gluconate (MEDLINE KIT)  15 mL Mouth Rinse BID  . docusate sodium  200 mg Oral Daily  . furosemide  40 mg Intravenous Once  . insulin regular  0-10 Units Intravenous TID WC  . levalbuterol  0.63 mg Nebulization Q6H  . mouth rinse  15 mL Mouth Rinse 10 times per day  . metoprolol tartrate  12.5 mg Oral BID   Or  . metoprolol tartrate  12.5 mg Per Tube BID  . pantoprazole  40 mg Oral Daily  . sodium chloride flush  3 mL Intravenous Q12H   Continuous Infusions: . sodium chloride 20 mL/hr at 02/18/17 1300  . sodium chloride    . sodium chloride 10 mL/hr at 02/18/17 0500  . dexmedetomidine 0.4 mcg/kg/hr (02/18/17 1300)  . DOPamine 3 mcg/kg/min (02/18/17 1300)  . insulin (NOVOLIN-R) infusion Stopped (02/18/17 0905)  . lactated ringers 10 mL/hr at 02/17/17 0700  . lactated ringers 20 mL/hr at 02/18/17 1300  . milrinone 0.25 mcg/kg/min (02/18/17 1300)  . nitroGLYCERIN Stopped (02/16/17 1300)  . phenylephrine (NEO-SYNEPHRINE) Adult infusion 20 mcg/min (02/18/17 1300)   PRN Meds: sodium chloride, lactated ringers, metoprolol, midazolam, morphine injection, ondansetron (ZOFRAN) IV, oxyCODONE, sodium chloride flush, traMADol   Vital Signs    Vitals:   02/18/17 1230 02/18/17 1245 02/18/17 1300 02/18/17 1315  BP:   (!) 144/60   Pulse: 88 88 88 80  Resp: 10 10 10 13   Temp: 98.1 F (36.7 C) 97.9 F (36.6 C) 97.9 F (36.6 C)  97.9 F (36.6 C)  TempSrc:      SpO2: 97% 98% 99% 100%  Weight:      Height:        Intake/Output Summary (Last 24 hours) at 02/18/17 1326 Last data filed at 02/18/17 1300  Gross per 24 hour  Intake          2398.96 ml  Output             1405 ml  Net           993.96 ml   Filed Weights   02/16/17 0234 02/17/17 0630 02/18/17 0500  Weight: 178 lb (80.7 kg) 198 lb 3.1 oz (89.9 kg) 201 lb 8 oz (91.4 kg)     ECG    NA - Personally Reviewed  Physical Exam   GEN: Intubated No acute distress.   Neck:  No JVD Cardiac: RRR, no  murmurs, rubs, or gallops.  Respiratory:  Clear to auscultation bilaterally. GI: Soft, nontender, non-distended  MS: No edema; No deformity. Neuro:  Sedated.  Unable to assess.     Labs    Chemistry Recent Labs Lab 02/16/17 0242  02/16/17 1802 02/17/17 0330 02/17/17 1604 02/18/17 0307  NA 141  < > 142 140  --  139  K 3.8  < >  4.8 4.9  --  4.5  CL 104  < > 111 111  --  112*  CO2 28  --   --  22  --  23  GLUCOSE 131*  < > 124* 98  --  117*  BUN 25*  < > 17 12  --  12  CREATININE 1.53*  < > 1.00 1.14* 1.30* 1.32*  CALCIUM 9.7  --   --  7.7*  --  7.7*  GFRNONAA 31*  < >  --  45* 38* 38*  GFRAA 36*  < >  --  52* 44* 44*  ANIONGAP 9  --   --  7  --  4*  < > = values in this interval not displayed.   Hematology Recent Labs Lab 02/17/17 0330 02/17/17 1604 02/18/17 0307  WBC 11.3* 13.4* 10.6*  RBC 3.91 3.64* 3.22*  HGB 9.6* 9.0* 8.0*  HCT 30.0* 28.0* 25.1*  MCV 76.7* 76.9* 78.0  MCH 24.6* 24.7* 24.8*  MCHC 32.0 32.1 31.9  RDW 14.3 14.6 15.4  PLT 84* 64* 52*    Cardiac Enzymes Recent Labs Lab 02/16/17 0242  TROPONINI 0.08*   No results for input(s): TROPIPOC in the last 168 hours.   BNP Recent Labs Lab 02/16/17 0242  BNP 176.0*     DDimer No results for input(s): DDIMER in the last 168 hours.   Radiology    Dg Chest Port 1 View  Result Date: 02/18/2017 CLINICAL DATA:  CABG. EXAM: PORTABLE CHEST 1 VIEW COMPARISON:   02/17/2017. FINDINGS: Endotracheal tube, NG tube, Swan-Ganz catheter, left chest tube, mediastinal drainage catheter in stable position. Aortic balloon pump tip noted just below the aortic arch. Prior CABG. Cardiomegaly with diffuse bilateral mild interstitial prominence, improved from prior exam. Findings suggesting improving congestive heart failure . Small left pleural effusion. No pneumothorax. IMPRESSION: 1. Aortic balloon catheter tip noted just below the aortic arch. Remaining lines and tubes including left chest tube in stable position. No pneumothorax. 2. Prior CABG. Cardiomegaly. Persistent but improving interstitial prominence consistent with improving CHF. Electronically Signed   By: Marcello Moores  Register   On: 02/18/2017 07:02   Dg Chest Port 1 View  Result Date: 02/17/2017 CLINICAL DATA:  CABG yesterday. EXAM: PORTABLE CHEST 1 VIEW COMPARISON:  One-view chest x-ray 02/16/2017 FINDINGS: Endotracheal tube terminates 4.5 cm above the carina. A Swan-Ganz catheter enters via a right IJ sheath and terminates in the main pulmonary outflow tract. Mediastinal drain and left-sided chest tubes in place without pneumothorax. The heart is enlarged. Moderate edema and small bilateral pleural effusions remain. Increased airspace opacity is present at the right middle or lower lobe. IMPRESSION: 1. Increasing airspace opacity in the right middle or lower lobe concerning for atelectasis or aspiration. 2. Otherwise stable cardiac enlargement, mild edema, and effusions. 3. Support apparatus is stable. 4. No pneumothorax. Electronically Signed   By: San Morelle M.D.   On: 02/17/2017 09:17    Cardiac Studies   Cath:   Left Main  Ost LM to LM lesion, 99% stenosed. The lesion is ulcerative.  Left Anterior Descending  Vessel is large.  Prox LAD lesion, 80% stenosed.  First Diagonal Branch  Vessel is small in size.  First Septal Branch  Vessel is small in size.  Second Diagonal Branch  Vessel is small in  size.  Second Septal Branch  Vessel is small in size.  Ramus Intermedius  Vessel is small.  Left Circumflex  Vessel is large.  Ost Cx to Prox  Cx lesion, 50% stenosed.  First Obtuse Marginal Branch  Vessel is moderate in size.  Ost 1st Mrg to 1st Mrg lesion, 90% stenosed.  Second Obtuse Marginal Branch  Vessel is small in size.  Third Obtuse Marginal Branch  Vessel is moderate in size.  Right Coronary Artery  Prox RCA to Mid RCA lesion, 10% stenosed.  Right Posterior Descending Artery  Vessel is small in size.    Patient Profile     76 y.o. female transferred from [facility] with NSTEMI and found to have critical LM stenosis.  She was taken for urgent CABG.  IABP was placed.    Assessment & Plan    CAD s/p CABG:    She remains intubated with poor respiratory mechanics.  Hemodynamics are stable.  IABP out today.  Continuing Milrinone and renal dose dopamine and weaning Neo.    Signed, Minus Breeding, MD  02/18/2017, 1:26 PM

## 2017-02-18 NOTE — Progress Notes (Addendum)
02/18/2017 0840 Dr. Servando Snare paged and made aware of ABG, NIF and Vital after CPPS x 20 minutes. Orders received to administer 1/2 amp Bicarb, add Xopenex 0.63 Q6H scheduled and re-attempt wean later today. RT at bedside and updated on plan.  Orders enacted. Pt. Updated on plan of care. Nods head in understanding. Will continue to closely monitor patient.  Elia Keenum, Arville Lime

## 2017-02-18 NOTE — Progress Notes (Signed)
TCTS BRIEF SICU PROGRESS NOTE  2 Days Post-Op  S/P Procedure(s) (LRB): CORONARY ARTERY BYPASS GRAFTING (CABG) x 3 (LIMA to LAD, SVG SEQUENTIALLY to RAMUS INTERMEDIATE and DISTAL CIRCUMFLEX) WITH ENDOSCOPIC HARVESTING OF RIGHT SAPHENOUS VEIN (N/A) TRANSESOPHAGEAL ECHOCARDIOGRAM W/O CARDIOVERSION   Sedated but arousable on vent AAI paced w/ stable hemodynamics on low dose milrinone and dopamine, IABP removed earlier today, low dose Neo for BP support O2 sats 99% UOP excellent  Plan: Wean Neo as tolerated.  Continue diuresis.  Possible acute vent wean in the morning.  Rexene Alberts, MD 02/18/2017 7:49 PM

## 2017-02-18 NOTE — Progress Notes (Signed)
NIF -22. VC 0.4L x3 (very weak, not the best effort) RN to call MD

## 2017-02-19 ENCOUNTER — Inpatient Hospital Stay (HOSPITAL_COMMUNITY): Payer: Medicare Other

## 2017-02-19 LAB — POCT I-STAT 3, ART BLOOD GAS (G3+)
ACID-BASE DEFICIT: 3 mmol/L — AB (ref 0.0–2.0)
ACID-BASE DEFICIT: 1 mmol/L (ref 0.0–2.0)
Acid-base deficit: 1 mmol/L (ref 0.0–2.0)
Acid-base deficit: 2 mmol/L (ref 0.0–2.0)
Bicarbonate: 22.7 mmol/L (ref 20.0–28.0)
Bicarbonate: 22.9 mmol/L (ref 20.0–28.0)
Bicarbonate: 23.5 mmol/L (ref 20.0–28.0)
Bicarbonate: 24.5 mmol/L (ref 20.0–28.0)
O2 SAT: 95 %
O2 Saturation: 96 %
O2 Saturation: 94 %
O2 Saturation: 95 %
PH ART: 7.417 (ref 7.350–7.450)
PH ART: 7.366 (ref 7.350–7.450)
PH ART: 7.329 — AB (ref 7.350–7.450)
PO2 ART: 81 mmHg — AB (ref 83.0–108.0)
Patient temperature: 37.2
TCO2: 24 mmol/L (ref 0–100)
TCO2: 24 mmol/L (ref 0–100)
TCO2: 25 mmol/L (ref 0–100)
TCO2: 26 mmol/L (ref 0–100)
pCO2 arterial: 35.3 mmHg (ref 32.0–48.0)
pCO2 arterial: 43.3 mmHg (ref 32.0–48.0)
pCO2 arterial: 41.1 mmHg (ref 32.0–48.0)
pCO2 arterial: 46.8 mmHg (ref 32.0–48.0)
pH, Arterial: 7.332 — ABNORMAL LOW (ref 7.350–7.450)
pO2, Arterial: 83 mmHg (ref 83.0–108.0)
pO2, Arterial: 80 mmHg — ABNORMAL LOW (ref 83.0–108.0)
pO2, Arterial: 72 mmHg — ABNORMAL LOW (ref 83.0–108.0)

## 2017-02-19 LAB — CBC
HCT: 24.7 % — ABNORMAL LOW (ref 36.0–46.0)
Hemoglobin: 7.8 g/dL — ABNORMAL LOW (ref 12.0–15.0)
MCH: 24.5 pg — ABNORMAL LOW (ref 26.0–34.0)
MCHC: 31.6 g/dL (ref 30.0–36.0)
MCV: 77.4 fL — ABNORMAL LOW (ref 78.0–100.0)
Platelets: 48 10*3/uL — ABNORMAL LOW (ref 150–400)
RBC: 3.19 MIL/uL — ABNORMAL LOW (ref 3.87–5.11)
RDW: 15.6 % — ABNORMAL HIGH (ref 11.5–15.5)
WBC: 10.3 10*3/uL (ref 4.0–10.5)

## 2017-02-19 LAB — GLUCOSE, CAPILLARY
GLUCOSE-CAPILLARY: 103 mg/dL — AB (ref 65–99)
GLUCOSE-CAPILLARY: 121 mg/dL — AB (ref 65–99)
GLUCOSE-CAPILLARY: 138 mg/dL — AB (ref 65–99)
GLUCOSE-CAPILLARY: 83 mg/dL (ref 65–99)
GLUCOSE-CAPILLARY: 93 mg/dL (ref 65–99)
Glucose-Capillary: 109 mg/dL — ABNORMAL HIGH (ref 65–99)
Glucose-Capillary: 116 mg/dL — ABNORMAL HIGH (ref 65–99)
Glucose-Capillary: 116 mg/dL — ABNORMAL HIGH (ref 65–99)
Glucose-Capillary: 138 mg/dL — ABNORMAL HIGH (ref 65–99)
Glucose-Capillary: 152 mg/dL — ABNORMAL HIGH (ref 65–99)

## 2017-02-19 LAB — BASIC METABOLIC PANEL
Anion gap: 7 (ref 5–15)
BUN: 14 mg/dL (ref 6–20)
CO2: 24 mmol/L (ref 22–32)
Calcium: 8 mg/dL — ABNORMAL LOW (ref 8.9–10.3)
Chloride: 109 mmol/L (ref 101–111)
Creatinine, Ser: 1.44 mg/dL — ABNORMAL HIGH (ref 0.44–1.00)
GFR calc Af Amer: 39 mL/min — ABNORMAL LOW (ref >60)
GFR calc non Af Amer: 34 mL/min — ABNORMAL LOW (ref >60)
Glucose, Bld: 121 mg/dL — ABNORMAL HIGH (ref 65–99)
Potassium: 4.1 mmol/L (ref 3.5–5.1)
Sodium: 140 mmol/L (ref 135–145)

## 2017-02-19 LAB — COOXEMETRY PANEL
CARBOXYHEMOGLOBIN: 1 % (ref 0.5–1.5)
METHEMOGLOBIN: 0.6 % (ref 0.0–1.5)
O2 SAT: 74.1 %
Total hemoglobin: 8.2 g/dL — ABNORMAL LOW (ref 12.0–16.0)

## 2017-02-19 MED ORDER — LEVALBUTEROL HCL 0.63 MG/3ML IN NEBU
0.6300 mg | INHALATION_SOLUTION | Freq: Three times a day (TID) | RESPIRATORY_TRACT | Status: DC
  Administered 2017-02-20 – 2017-02-22 (×7): 0.63 mg via RESPIRATORY_TRACT
  Filled 2017-02-19 (×7): qty 3

## 2017-02-19 MED ORDER — CHLORHEXIDINE GLUCONATE 0.12 % MT SOLN
OROMUCOSAL | Status: AC
  Administered 2017-02-19: 15 mL via OROMUCOSAL
  Filled 2017-02-19: qty 15

## 2017-02-19 MED ORDER — FUROSEMIDE 10 MG/ML IJ SOLN
INTRAMUSCULAR | Status: AC
  Filled 2017-02-19: qty 4

## 2017-02-19 MED ORDER — FUROSEMIDE 10 MG/ML IJ SOLN
80.0000 mg | Freq: Once | INTRAMUSCULAR | Status: AC
  Administered 2017-02-19: 80 mg via INTRAVENOUS

## 2017-02-19 MED ORDER — AMIODARONE LOAD VIA INFUSION
150.0000 mg | Freq: Once | INTRAVENOUS | Status: AC
  Administered 2017-02-19: 150 mg via INTRAVENOUS

## 2017-02-19 MED ORDER — AMIODARONE HCL IN DEXTROSE 360-4.14 MG/200ML-% IV SOLN
INTRAVENOUS | Status: AC
  Filled 2017-02-19: qty 200

## 2017-02-19 MED ORDER — ORAL CARE MOUTH RINSE
15.0000 mL | Freq: Two times a day (BID) | OROMUCOSAL | Status: DC
  Administered 2017-02-19 – 2017-03-02 (×15): 15 mL via OROMUCOSAL

## 2017-02-19 MED ORDER — INSULIN ASPART 100 UNIT/ML ~~LOC~~ SOLN
0.0000 [IU] | SUBCUTANEOUS | Status: DC
  Administered 2017-02-19 (×3): 2 [IU] via SUBCUTANEOUS
  Administered 2017-02-20 (×2): 4 [IU] via SUBCUTANEOUS
  Administered 2017-02-20 (×2): 2 [IU] via SUBCUTANEOUS
  Administered 2017-02-21: 4 [IU] via SUBCUTANEOUS
  Administered 2017-02-21: 8 [IU] via SUBCUTANEOUS

## 2017-02-19 MED ORDER — FUROSEMIDE 10 MG/ML IJ SOLN
40.0000 mg | Freq: Once | INTRAMUSCULAR | Status: AC
  Administered 2017-02-19: 40 mg via INTRAVENOUS
  Filled 2017-02-19: qty 4

## 2017-02-19 MED ORDER — AMIODARONE HCL IN DEXTROSE 360-4.14 MG/200ML-% IV SOLN
30.0000 mg/h | INTRAVENOUS | Status: DC
Start: 2017-02-20 — End: 2017-02-21
  Administered 2017-02-20 (×2): 30 mg/h via INTRAVENOUS
  Filled 2017-02-19 (×2): qty 200

## 2017-02-19 MED ORDER — AMIODARONE HCL IN DEXTROSE 360-4.14 MG/200ML-% IV SOLN
60.0000 mg/h | INTRAVENOUS | Status: DC
  Administered 2017-02-19 (×2): 60 mg/h via INTRAVENOUS
  Filled 2017-02-19: qty 200

## 2017-02-19 MED FILL — Sodium Chloride IV Soln 0.9%: INTRAVENOUS | Qty: 100 | Status: AC

## 2017-02-19 MED FILL — Phenylephrine HCl Inj 10 MG/ML: INTRAMUSCULAR | Qty: 1 | Status: AC

## 2017-02-19 NOTE — Progress Notes (Signed)
RT NOTE:  Cardiac Rapid Wean initiated. Per MD patient should be ready to extubate during 1st rounds.

## 2017-02-19 NOTE — Procedures (Signed)
Extubation Procedure Note  Patient Details:   Name: Stephanie Hendricks DOB: 1941-09-08 MRN: [id number]   Airway Documentation:     Evaluation  O2 sats: stable throughout Complications: No apparent complications Patient did tolerate procedure well. Bilateral Breath Sounds: Fine crackles   Yes pt able to vocalize.   RT performed another VC prior to extubation and pt was able to get 0.46L. Pt unable to breathe around deflated cuff and no loss of volumes when placed on full support but Dr. Servando Snare was called and gave the ok to extubate without cuff leak. IS performed with 246mL achievedx5. No stridor noted.   Irineo Axon [facility] 02/19/2017, 9:45 AM

## 2017-02-19 NOTE — Progress Notes (Addendum)
CT surgery p.m. Rounds  Patient extubated today She remains hypertensive-Will wean dopamine and continue milrinone Diuresing with IV Lasix Rapid atrial fibrillation this p.m. started on IV amiodarone protocol Neuro intact, CBG 121

## 2017-02-19 NOTE — Progress Notes (Signed)
Patient ID: Stephanie Hendricks, female   DOB: 02-25-1941, 76 y.o.   MRN: [id number] TCTS DAILY ICU PROGRESS NOTE                   [address]            [place] [address]          [phone]   3 Days Post-Op Procedure(s) (LRB): CORONARY ARTERY BYPASS GRAFTING (CABG) x 3 (LIMA to LAD, SVG SEQUENTIALLY to RAMUS INTERMEDIATE and DISTAL CIRCUMFLEX) WITH ENDOSCOPIC HARVESTING OF RIGHT SAPHENOUS VEIN (N/A) TRANSESOPHAGEAL ECHOCARDIOGRAM W/O CARDIOVERSION  Total Length of Stay:  LOS: 3 days   Subjective: Awake and alert, now extubated hour ago, stable and talking   Objective: Vital signs in last 24 hours: Temp:  [97.5 F (36.4 C)-99.9 F (37.7 C)] 99.1 F (37.3 C) (02/27 1015) Pulse Rate:  [43-114] 104 (02/27 1015) Cardiac Rhythm: Sinus tachycardia (02/27 0938) Resp:  [10-26] 20 (02/27 1015) BP: (94-179)/(49-69) 134/64 (02/27 1010) SpO2:  [93 %-100 %] 98 % (02/27 1015) Arterial Line BP: (83-207)/(38-69) 170/56 (02/27 1015) FiO2 (%):  [40 %] 40 % (02/27 0800) Weight:  [199 lb 11.8 oz (90.6 kg)] 199 lb 11.8 oz (90.6 kg) (02/27 0600)  Filed Weights   02/17/17 0630 02/18/17 0500 02/19/17 0600  Weight: 198 lb 3.1 oz (89.9 kg) 201 lb 8 oz (91.4 kg) 199 lb 11.8 oz (90.6 kg)    Weight change: -1 lb 12.2 oz (-0.8 kg)   Hemodynamic parameters for last 24 hours: PAP: (36-63)/(14-33) 46/20 CO:  [4 L/min-6.4 L/min] 6.4 L/min CI:  [2.2 L/min/m2-3.5 L/min/m2] 3.5 L/min/m2  Intake/Output from previous day: 02/26 0701 - 02/27 0700 In: 1975.9 [I.V.:1755.9; NG/GT:220] Out: 3240 [Urine:2840; Emesis/NG output:150; Chest Tube:250]  Intake/Output this shift: Total I/O In: 150.3 [I.V.:120.3; NG/GT:30] Out: 1385 [Urine:1065; Emesis/NG output:250; Chest Tube:70]  Current Meds: Scheduled Meds: . acetaminophen  1,000 mg Oral Q6H   Or  . acetaminophen (TYLENOL) oral liquid 160 mg/5 mL  1,000 mg Per Tube Q6H  . aspirin EC  81 mg Oral Daily   Or  . aspirin  81 mg Per Tube  Daily  . atorvastatin  40 mg Oral q1800  . bisacodyl  10 mg Oral Daily   Or  . bisacodyl  10 mg Rectal Daily  . chlorhexidine gluconate (MEDLINE KIT)  15 mL Mouth Rinse BID  . docusate sodium  200 mg Oral Daily  . insulin aspart  0-24 Units Subcutaneous Q4H  . levalbuterol  0.63 mg Nebulization Q6H  . mouth rinse  15 mL Mouth Rinse BID  . metoprolol tartrate  12.5 mg Oral BID   Or  . metoprolol tartrate  12.5 mg Per Tube BID  . pantoprazole  40 mg Oral Daily  . sodium chloride flush  3 mL Intravenous Q12H   Continuous Infusions: . sodium chloride 20 mL/hr at 02/19/17 0700  . sodium chloride    . sodium chloride Stopped (02/18/17 2000)  . dexmedetomidine Stopped (02/19/17 0600)  . DOPamine 2.974 mcg/kg/min (02/19/17 0700)  . lactated ringers 10 mL/hr at 02/17/17 0700  . lactated ringers 20 mL/hr at 02/19/17 0700  . milrinone 0.25 mcg/kg/min (02/19/17 0700)  . nitroGLYCERIN 40 mcg/min (02/19/17 1000)  . phenylephrine (NEO-SYNEPHRINE) Adult infusion Stopped (02/19/17 0720)   PRN Meds:.sodium chloride, lactated ringers, metoprolol, morphine injection, ondansetron (ZOFRAN) IV, oxyCODONE, sodium chloride flush, traMADol  General appearance: alert, cooperative and no distress Neurologic: intact Heart: regular rate and rhythm, S1,  S2 normal, no murmur, click, rub or gallop Lungs: diminished breath sounds bibasilar Abdomen: soft, non-tender; bowel sounds normal; no masses,  no organomegaly Extremities: extremities normal, atraumatic, no cyanosis or edema and Homans sign is negative, no sign of DVT Wound: sternum stable  Feet neuro inatct, warm , doppler pulses , right groin ok  Lab Results: CBC: Recent Labs  02/18/17 0307 02/19/17 0400  WBC 10.6* 10.3  HGB 8.0* 7.8*  HCT 25.1* 24.7*  PLT 52* 48*   BMET:  Recent Labs  02/18/17 0307 02/19/17 0400  NA 139 140  K 4.5 4.1  CL 112* 109  CO2 23 24  GLUCOSE 117* 121*  BUN 12 14  CREATININE 1.32* 1.44*  CALCIUM 7.7* 8.0*     CMET: Lab Results  Component Value Date   WBC 10.3 02/19/2017   HGB 7.8 (L) 02/19/2017   HCT 24.7 (L) 02/19/2017   PLT 48 (L) 02/19/2017   GLUCOSE 121 (H) 02/19/2017   CHOL 70 02/16/2017   TRIG 62 02/16/2017   HDL 22 (L) 02/16/2017   LDLCALC 36 02/16/2017   ALT 11 11/06/2014   AST 13 11/06/2014   NA 140 02/19/2017   K 4.1 02/19/2017   CL 109 02/19/2017   CREATININE 1.44 (H) 02/19/2017   BUN 14 02/19/2017   CO2 24 02/19/2017   TSH 0.785 02/16/2017   INR 1.52 02/16/2017   HGBA1C 6.5 (H) 11/06/2014      PT/INR:  Recent Labs  02/16/17 1239  LABPROT 18.4*  INR 1.52   Radiology: Dg Chest Port 1 View  Result Date: 02/19/2017 CLINICAL DATA:  Emergency CABG for critical LEFT coronary stenosis. EXAM: PORTABLE CHEST 1 VIEW COMPARISON:  02/18/2017. FINDINGS: Cardiomegaly. BILATERAL lower lobe opacities appear worse, possibly representing atelectasis, infiltrate, effusion, or edema. Chest and mediastinal tubes in good position without pneumothorax. Swan-Ganz catheter tip main pulmonary artery. ETT 12 mm above carina, could be pulled back 20-30 mm. I no longer see the intra aortic balloon pump. IMPRESSION: Status post emergency CABG. Tubes and lines as described. Worsening aeration. Electronically Signed   By: Staci Righter M.D.   On: 02/19/2017 07:29     Assessment/Plan: S/P Procedure(s) (LRB): CORONARY ARTERY BYPASS GRAFTING (CABG) x 3 (LIMA to LAD, SVG SEQUENTIALLY to RAMUS INTERMEDIATE and DISTAL CIRCUMFLEX) WITH ENDOSCOPIC HARVESTING OF RIGHT SAPHENOUS VEIN (N/A) TRANSESOPHAGEAL ECHOCARDIOGRAM W/O CARDIOVERSION Mobilize Diuresis Diabetes control Continue foley due to strict I&O and urinary output monitoring Renal function staying at base line     Grace Isaac 02/19/2017 10:58 AM

## 2017-02-19 NOTE — Op Note (Unsigned)
Stephanie Hendricks, PASZKOWSKI               ACCOUNT NO.:  [id number]  MEDICAL RECORD NO.:  ID:[id number]  LOCATION:  [facility]                         FACILITY:  [facility]  PHYSICIAN:  [name], MD    DATE OF BIRTH:  [birth year]  DATE OF PROCEDURE:  02/16/2017 DATE OF DISCHARGE:                              OPERATIVE REPORT   PREOPERATIVE DIAGNOSIS:  Critical left main obstruction with cardiogenic shock.  POSTOPERATIVE DIAGNOSIS:  Critical left main obstruction with cardiogenic shock.  PROCEDURE:  Emergency coronary artery bypass grafting x3 with the left internal mammary to the left anterior descending coronary artery; reverse saphenous vein graft sequentially to the ramus and distal circumflex with right thigh and endovein harvesting.  SURGEON:  [name], MD  FIRST ASSISTANT:  Lars Pinks, PA  BRIEF HISTORY:  The patient is a 76 year old female, who has been followed for bradycardia and mild aortic stenosis by Cardiology.  Two years ago, echocardiogram suggested mild AS.  She presented on the day of surgery approximately 1:30 in the morning to the [facility] with prolonged chest pain.  She was transferred to [facility] and underwent emergency cardiac catheterization by Dr. Angelena Form, that showed a patent right coronary artery, but a high-grade greater than 95% stenosis of the left main coronary artery and also with a large circumflex system and a ramus branch with 80% stenosis.  Right coronary artery was of moderate size, but without significant disease.  At the time of heart catheterization, an intraaortic balloon pump was placed. An emergency consultation at 5:30 in the morning was requested and the patient was taken directly to the operating room for emergency coronary artery bypass grafting.  The patient was awake, neurologically intact and agreed with proceeding.  DESCRIPTION OF THE PROCEDURE:  As noted, the patient was brought directly from the cath  lab to the operating room.  A Swan-Ganz and arterial line monitors were placed, the patient underwent general endotracheal anesthesia.  TEE probe was placed.  The skin of the chest, abdomen and legs was prepped with Betadine and draped in sterile manner. After appropriate time-out was performed.  We then proceeded with endoscopic vein harvesting of right greater saphenous vein in the thigh and upper calf.  Median sternotomy was performed.  Left internal mammary artery was dissected down as a pedicle graft.  The distal artery was divided, had good free flow.  Pericardium was opened.  The patient was systemically heparinized.  Ascending aorta was cannulated.  The right atrium was cannulated.  An aortic root vent cardioplegia needle was introduced into the ascending aorta.  The patient was placed on cardiopulmonary bypass 2.4 L/min/m2.  Sites of anastomosis were dissected out.  The patient's body temperature was cooled to 32 degrees. Aortic crossclamp was applied and 800 mL of cold blood potassium cardioplegia was administered antegrade.  With diastolic arrest of the heart, myocardial septal temperature was monitored throughout the crossclamp.  The heart was then elevated and the moderate-sized ramus branch and distal circumflex branches were easily identified and were of good size.  We turned our attention first to the ramus branch, which was opened, admitted a 1.5 mm probe, using a diamond-type, side-to-side anastomosis was  carried out with a running 8-0 Prolene.  Distal extent of the same vein was then carried to the distal circumflex, which was equally as big artery, easily admitted a 1.5-mm probe and using a running 7-0 Prolene, distal anastomosis was performed.  Additional cold blood cardioplegia was administered down the vein grafts.  Attention was then turned to the left anterior descending coronary artery, which was a relatively small vessel and intramyocardial.  The vessel was  opened and admitted a 1-mm probe distally and 1.5-mm probe proximally.  Using a running 8-0 Prolene, left internal mammary artery was anastomosed to the left anterior descending coronary artery.  With the crossclamp still in place, a single punch aortotomy was performed.  Using a running 7-0 Prolene, a proximal anastomosis was performed.  The bulldog on the mammary artery was removed, prompt rise in myocardial septal temperature.  The heart was allowed to passively fill and de-air and the proximal anastomosis was completed.  An aortic cross-clamp was removed with a total crossclamp time of 67 minutes.  Initially, the patient was in complete heart block.  Atrial and ventricular pacing wires were applied.  The patient was then ventilated and weaned from cardiopulmonary bypass.  The intraaortic balloon pump, which had been placed and the cath lab was restarted and with low-dose dopamine and Milrinone infusion with the intraaortic balloon pump.  The patient was then ventilated and weaned from cardiopulmonary bypass.  She was decannulated in usual fashion.  Protamine sulfate was administered with the operative field hemostatic.  A left pleural tube and a Blake mediastinal drain were left in place.  The sternum was closed with #6 stainless steel wire.  Fascia was closed with interrupted 0 Vicryl, running 3-0 Vicryl subcutaneous tissue, and a 3-0 subcuticular stitch in skin edges.  Dry dressings were applied.  Sponge and needle counts were reported as correct at the completion of procedure.  The patient tolerated the procedure without obvious complication.  She was transferred to the Surgical Intensive care Unit for further postoperative care.  Total pump time was 110 minutes.  Because of low hematocrit prior to surgery, 2 units of packed red blood cells were placed in the pump.     [name], MD     EG/MEDQ  D:  02/18/2017  T:  02/19/2017  Job:  [id number]  cc:   Satira Sark, MD

## 2017-02-19 NOTE — Care Management Note (Addendum)
Case Management Note  Patient Details  Name: Stephanie Hendricks MRN: [id number] Date of Birth: [birth year]  Subjective/Objective:  From home with son , Stephanie Hendricks [id number] home, cell [phone] and grand daughter, s/p emergent CABG x 3, extubated, medialstinal chest tube d'cd, has plueral chest tube to suction.  She has a rolling walker and a cane at home, she has a PCP , Dr. Nevada Crane, per pt eval rec SNF, CSW consulted.   NCM will cont to follow for dc needs.                   Action/Plan:   Expected Discharge Date:                  Expected Discharge Plan:     In-House Referral:     Discharge planning Services  CM Consult  Post Acute Care Choice:    Choice offered to:     DME Arranged:    DME Agency:     HH Arranged:    HH Agency:     Status of Service:  In process, will continue to follow  If discussed at Long Length of Stay Meetings, dates discussed:    Additional Comments:  Zenon Mayo, RN 02/19/2017, 6:05 PM

## 2017-02-19 NOTE — Progress Notes (Signed)
RT note- NIF-20                FVC-300ml's best effort, small thick tan/pink frothy secretions. ABG done and met parameters. 

## 2017-02-20 ENCOUNTER — Inpatient Hospital Stay (HOSPITAL_COMMUNITY): Payer: Medicare Other

## 2017-02-20 LAB — TYPE AND SCREEN
ABO/RH(D): B POS
Antibody Screen: NEGATIVE
UNIT DIVISION: 0
UNIT DIVISION: 0
UNIT DIVISION: 0
Unit division: 0

## 2017-02-20 LAB — CBC
HCT: 25.6 % — ABNORMAL LOW (ref 36.0–46.0)
Hemoglobin: 8.3 g/dL — ABNORMAL LOW (ref 12.0–15.0)
MCH: 24.9 pg — ABNORMAL LOW (ref 26.0–34.0)
MCHC: 32.4 g/dL (ref 30.0–36.0)
MCV: 76.9 fL — ABNORMAL LOW (ref 78.0–100.0)
Platelets: 93 10*3/uL — ABNORMAL LOW (ref 150–400)
RBC: 3.33 MIL/uL — ABNORMAL LOW (ref 3.87–5.11)
RDW: 16.2 % — ABNORMAL HIGH (ref 11.5–15.5)
WBC: 10.2 10*3/uL (ref 4.0–10.5)

## 2017-02-20 LAB — GLUCOSE, CAPILLARY
GLUCOSE-CAPILLARY: 120 mg/dL — AB (ref 65–99)
GLUCOSE-CAPILLARY: 117 mg/dL — AB (ref 65–99)
GLUCOSE-CAPILLARY: 140 mg/dL — AB (ref 65–99)
Glucose-Capillary: 183 mg/dL — ABNORMAL HIGH (ref 65–99)
Glucose-Capillary: 136 mg/dL — ABNORMAL HIGH (ref 65–99)

## 2017-02-20 LAB — BPAM RBC
BLOOD PRODUCT EXPIRATION DATE: 201803092359
BLOOD PRODUCT EXPIRATION DATE: 201803222359
Blood Product Expiration Date: 201803072359
Blood Product Expiration Date: 201803192359
ISSUE DATE / TIME: 201802240709
ISSUE DATE / TIME: 201802240709
ISSUE DATE / TIME: 201802240709
ISSUE DATE / TIME: 201802240709
UNIT TYPE AND RH: 7300
Unit Type and Rh: 7300
Unit Type and Rh: 7300
Unit Type and Rh: 7300

## 2017-02-20 LAB — BASIC METABOLIC PANEL
Anion gap: 8 (ref 5–15)
BUN: 16 mg/dL (ref 6–20)
CO2: 27 mmol/L (ref 22–32)
CREATININE: 1.48 mg/dL — AB (ref 0.44–1.00)
Calcium: 8.5 mg/dL — ABNORMAL LOW (ref 8.9–10.3)
Chloride: 107 mmol/L (ref 101–111)
GFR, EST AFRICAN AMERICAN: 38 mL/min — AB (ref >60)
GFR, EST NON AFRICAN AMERICAN: 33 mL/min — AB (ref >60)
Glucose, Bld: 134 mg/dL — ABNORMAL HIGH (ref 65–99)
POTASSIUM: 3.6 mmol/L (ref 3.5–5.1)
SODIUM: 142 mmol/L (ref 135–145)

## 2017-02-20 MED ORDER — METOPROLOL TARTRATE 25 MG PO TABS
25.0000 mg | ORAL_TABLET | Freq: Two times a day (BID) | ORAL | Status: DC
  Administered 2017-02-20 – 2017-02-21 (×3): 25 mg via ORAL
  Filled 2017-02-20 (×3): qty 1

## 2017-02-20 MED ORDER — FUROSEMIDE 10 MG/ML IJ SOLN
40.0000 mg | Freq: Once | INTRAMUSCULAR | Status: AC
  Administered 2017-02-20: 40 mg via INTRAVENOUS
  Filled 2017-02-20: qty 4

## 2017-02-20 MED ORDER — AMLODIPINE BESYLATE 10 MG PO TABS
10.0000 mg | ORAL_TABLET | Freq: Every day | ORAL | Status: DC
  Administered 2017-02-20 – 2017-03-02 (×11): 10 mg via ORAL
  Filled 2017-02-20 (×11): qty 1

## 2017-02-20 MED ORDER — SODIUM CHLORIDE 0.9 % IV SOLN
30.0000 meq | Freq: Once | INTRAVENOUS | Status: AC
  Administered 2017-02-20: 30 meq via INTRAVENOUS
  Filled 2017-02-20: qty 15

## 2017-02-20 MED ORDER — SODIUM CHLORIDE 0.9% FLUSH
10.0000 mL | Freq: Two times a day (BID) | INTRAVENOUS | Status: DC
  Administered 2017-02-20 – 2017-02-22 (×3): 10 mL
  Administered 2017-02-22 – 2017-02-25 (×2): 20 mL
  Administered 2017-02-26: 10 mL

## 2017-02-20 MED ORDER — POTASSIUM CHLORIDE CRYS ER 20 MEQ PO TBCR
20.0000 meq | EXTENDED_RELEASE_TABLET | Freq: Once | ORAL | Status: AC
  Administered 2017-02-20: 20 meq via ORAL
  Filled 2017-02-20: qty 1

## 2017-02-20 MED ORDER — CHLORHEXIDINE GLUCONATE CLOTH 2 % EX PADS
6.0000 | MEDICATED_PAD | Freq: Every day | CUTANEOUS | Status: DC
  Administered 2017-02-20 – 2017-03-02 (×7): 6 via TOPICAL

## 2017-02-20 MED ORDER — METOPROLOL TARTRATE 25 MG/10 ML ORAL SUSPENSION: 25.0000 mg | Freq: Two times a day (BID) | ORAL | Status: DC

## 2017-02-20 MED ORDER — SODIUM CHLORIDE 0.9% FLUSH
10.0000 mL | INTRAVENOUS | Status: DC | PRN
  Administered 2017-02-24: 20 mL
  Administered 2017-03-01 – 2017-03-02 (×2): 10 mL
  Filled 2017-02-20 (×3): qty 40

## 2017-02-20 MED ORDER — SODIUM CHLORIDE 0.9% FLUSH
10.0000 mL | INTRAVENOUS | Status: DC | PRN
  Administered 2017-02-23: 20 mL
  Administered 2017-02-23: 10 mL
  Filled 2017-02-20 (×2): qty 40

## 2017-02-20 NOTE — Evaluation (Signed)
Physical Therapy Evaluation Patient Details Name: Stephanie Hendricks MRN: [id number] DOB: 10-28-1941 Today's Date: 02/20/2017   History of Present Illness  Stephanie Hendricks is a very pleasant 76 yo woman with past medical history of sinus bradycardia, CKD, DM, HTN, hyperlipidemia who is coming as a transfer from [facility] for high risk NSTEMI. 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   Clinical Impression  Patient is s/p above surgery resulting in functional limitations due to the deficits listed below (see PT Problem List). Pt currently requires modAx2 for OOB mobility. Pt was indep PTA. Patient will benefit from skilled PT to increase their independence and safety with mobility to allow discharge to the venue listed below.       Follow Up Recommendations SNF;Supervision/Assistance - 24 hour    Equipment Recommendations  Rolling walker with 5" wheels    Recommendations for Other Services       Precautions / Restrictions Precautions Precautions: Fall;Sternal Precaution Comments: pt educated on sternal precautions Restrictions Weight Bearing Restrictions: Yes Other Position/Activity Restrictions: bilat UE no push/pulling due to sternal precautions      Mobility  Bed Mobility Overal bed mobility: Needs Assistance Bed Mobility: Supine to Sit     Supine to sit: Max assist;+2 for physical assistance;+2 for safety/equipment;HOB elevated     General bed mobility comments: pt intitiated LE mvmt however dependent for trunk elevations due to having sternal precautions  Transfers Overall transfer level: Needs assistance Equipment used: 2 person hand held assist Transfers: Sit to/from [facility] Sit to Stand: Mod assist;Max assist;+2 physical assistance;+2 safety/equipment Stand pivot transfers: Mod assist;Max assist;+2 physical assistance;+2 safety/equipment       General transfer comment: max directional v/c's to not push up from bed, modA for initial power up. pt completed 2  std pvt, to bsc and then to chair, pt able to advance LEs but gave out quickly  Ambulation/Gait             General Gait Details: unable at this time  Stairs            Wheelchair Mobility    Modified Rankin (Stroke Patients Only)       Balance Overall balance assessment: Needs assistance Sitting-balance support: Feet supported;No upper extremity supported Sitting balance-Leahy Scale: Fair     Standing balance support: No upper extremity supported Standing balance-Leahy Scale: Poor Standing balance comment: requires physical assist                             Pertinent Vitals/Pain Pain Assessment: 0-10 Pain Score: 2  Pain Location: incisional Pain Descriptors / Indicators: Sore    Home Living Family/patient expects to be discharged to:: Private residence Living Arrangements: Children Available Help at Discharge: Family;Available 24 hours/day (22yo granddaughter and son who works 3rd shift) Type of Home: House Home Access: Stairs to enter Entrance Stairs-Rails: Can reach both Technical brewer of Steps: 3 Home Layout: One level Home Equipment: None Additional Comments: sponge bathes    Prior Function Level of Independence: Independent         Comments: was amb without AD,sponge bathes     Hand Dominance   Dominant Hand: Right    Extremity/Trunk Assessment   Upper Extremity Assessment Upper Extremity Assessment: Generalized weakness (limited by sternal precautions)    Lower Extremity Assessment Lower Extremity Assessment: Generalized weakness    Cervical / Trunk Assessment Cervical / Trunk Assessment: Kyphotic  Communication   Communication: No difficulties  Cognition Arousal/Alertness: Awake/alert Behavior During Therapy: WFL for tasks assessed/performed Overall Cognitive Status: Within Functional Limits for tasks assessed                      General Comments General comments (skin integrity, edema, etc.):  pt unsuccessful on commode    Exercises     Assessment/Plan    PT Assessment Patient needs continued PT services  PT Problem List Decreased strength;Decreased activity tolerance;Decreased balance;Decreased mobility;Decreased coordination;Decreased knowledge of use of DME;Decreased safety awareness       PT Treatment Interventions DME instruction;Gait training;Stair training;Functional mobility training;Therapeutic activities;Therapeutic exercise;Balance training    PT Goals (Current goals can be found in the Care Plan section)  Acute Rehab PT Goals Patient Stated Goal: home PT Goal Formulation: With patient Time For Goal Achievement: 02/27/17 Potential to Achieve Goals: Good    Frequency Min 3X/week   Barriers to discharge        Co-evaluation               End of Session Equipment Utilized During Treatment: Gait belt Activity Tolerance: Patient tolerated treatment well Patient left: in chair;with call bell/phone within reach;with nursing/sitter in room Nurse Communication: Mobility status PT Visit Diagnosis: Difficulty in walking, not elsewhere classified (R26.2)         Time: 1000-1020 PT Time Calculation (min) (ACUTE ONLY): 20 min   Charges:   PT Evaluation $PT Eval Moderate Complexity: 1 Procedure     PT G Codes:         Stephanie Hendricks M Stephanie Hendricks 02/20/2017, 2:04 PM   Stephanie Hendricks, PT, DPT Pager #: [phone] Office #: [phone]

## 2017-02-20 NOTE — Progress Notes (Addendum)
[facility] DAILY ICU PROGRESS NOTE                   [address]            [place] [address]          [phone]   4 Days Post-Op Procedure(s) (LRB): CORONARY ARTERY BYPASS GRAFTING (CABG) x 3 (LIMA to LAD, SVG SEQUENTIALLY to RAMUS INTERMEDIATE and DISTAL CIRCUMFLEX) WITH ENDOSCOPIC HARVESTING OF RIGHT SAPHENOUS VEIN (N/A) TRANSESOPHAGEAL ECHOCARDIOGRAM W/O CARDIOVERSION  Total Length of Stay:  LOS: 4 days   Subjective: Patient sitting in chair. Has incisional pain and is cold.  Objective: Vital signs in last 24 hours: Temp:  [97.8 F (36.6 C)-99.3 F (37.4 C)] 99 F (37.2 C) (02/28 0720) Pulse Rate:  [59-110] 78 (02/28 1100) Cardiac Rhythm: Normal sinus rhythm (02/28 1000) Resp:  [17-31] 17 (02/28 1100) BP: (110-174)/(47-113) 153/53 (02/28 1100) SpO2:  [94 %-99 %] 95 % (02/28 1100) Arterial Line BP: (133-188)/(44-64) 158/47 (02/28 1100) Weight:  [190 lb 4.1 oz (86.3 kg)] 190 lb 4.1 oz (86.3 kg) (02/28 0500)  Filed Weights   02/18/17 0500 02/19/17 0600 02/20/17 0500  Weight: 201 lb 8 oz (91.4 kg) 199 lb 11.8 oz (90.6 kg) 190 lb 4.1 oz (86.3 kg)    Weight change: -9 lb 7.7 oz (-4.3 kg)      Intake/Output from previous day: 02/27 0701 - 02/28 0700 In: 991.5 [P.O.:90; I.V.:606.5; NG/GT:30; IV Piggyback:265] Out: 4492 [Urine:3340; Emesis/NG output:250; Chest Tube:360]  Intake/Output this shift: Total I/O In: 314 [I.V.:314] Out: 155 [Urine:125; Chest Tube:30]  Current Meds: Scheduled Meds: . acetaminophen  1,000 mg Oral Q6H   Or  . acetaminophen (TYLENOL) oral liquid 160 mg/5 mL  1,000 mg Per Tube Q6H  . aspirin EC  81 mg Oral Daily   Or  . aspirin  81 mg Per Tube Daily  . atorvastatin  40 mg Oral q1800  . bisacodyl  10 mg Oral Daily   Or  . bisacodyl  10 mg Rectal Daily  . chlorhexidine gluconate (MEDLINE KIT)  15 mL Mouth Rinse BID  . docusate sodium  200 mg Oral Daily  . insulin aspart  0-24 Units Subcutaneous Q4H  . levalbuterol  0.63 mg  Nebulization TID  . mouth rinse  15 mL Mouth Rinse BID  . metoprolol tartrate  12.5 mg Oral BID   Or  . metoprolol tartrate  12.5 mg Per Tube BID  . pantoprazole  40 mg Oral Daily  . sodium chloride flush  3 mL Intravenous Q12H   Continuous Infusions: . sodium chloride Stopped (02/19/17 0938)  . amiodarone 30 mg/hr (02/20/17 0924)  . lactated ringers 10 mL/hr at 02/20/17 0800  . milrinone 0.25 mcg/kg/min (02/20/17 0925)  . nitroGLYCERIN 50 mcg/min (02/20/17 1000)  . phenylephrine (NEO-SYNEPHRINE) Adult infusion Stopped (02/19/17 0720)   PRN Meds:.sodium chloride, metoprolol, morphine injection, ondansetron (ZOFRAN) IV, oxyCODONE, sodium chloride flush, traMADol  General appearance: alert, cooperative and no distress Neurologic: intact Heart: RRR Lungs: Diminshed at bases R>L Extremities: Bilateral LE edema Wound: Aquacel intact;RLE wounds are clean and dry  Lab Results: CBC: Recent Labs  02/19/17 0400 02/20/17 0407  WBC 10.3 10.2  HGB 7.8* 8.3*  HCT 24.7* 25.6*  PLT 48* 93*   BMET:  Recent Labs  02/19/17 0400 02/20/17 0407  NA 140 142  K 4.1 3.6  CL 109 107  CO2 24 27  GLUCOSE 121* 134*  BUN 14 16  CREATININE 1.44*  1.48*  CALCIUM 8.0* 8.5*    CMET: Lab Results  Component Value Date   WBC 10.2 02/20/2017   HGB 8.3 (L) 02/20/2017   HCT 25.6 (L) 02/20/2017   PLT 93 (L) 02/20/2017   GLUCOSE 134 (H) 02/20/2017   CHOL 70 02/16/2017   TRIG 62 02/16/2017   HDL 22 (L) 02/16/2017   LDLCALC 36 02/16/2017   ALT 11 11/06/2014   AST 13 11/06/2014   NA 142 02/20/2017   K 3.6 02/20/2017   CL 107 02/20/2017   CREATININE 1.48 (H) 02/20/2017   BUN 16 02/20/2017   CO2 27 02/20/2017   TSH 0.785 02/16/2017   INR 1.52 02/16/2017   HGBA1C 6.5 (H) 11/06/2014      PT/INR: No results for input(s): LABPROT, INR in the last 72 hours. Radiology: Dg Chest Port 1 View  Result Date: 02/20/2017 CLINICAL DATA:  Status post CABG 4 days ago. EXAM: PORTABLE CHEST 1 VIEW  COMPARISON:  Portable chest x-ray of February 19, 2017. FINDINGS: Hypoinflation has not worsened since extubation of the trachea. There is bibasilar atelectasis. There is a moderate sized right pleural effusion layering posteriorly. The left chest tube is in stable position with the tip overlying the posterolateral aspect of the fourth rib. There is no pneumothorax. The cardiac silhouette remains enlarged. The pulmonary vascularity is slightly less engorged. There is calcification in the wall of the aortic arch. The Swan-Ganz catheter has been removed as has the esophagogastric tube and the lower left chest tube. IMPRESSION: Persistent hypoinflation with bibasilar atelectasis. Moderate size right pleural effusion. No pneumothorax. Stable cardiomegaly with slightly decreased pulmonary vascular congestion and pulmonary edema. Electronically Signed   By: David  Martinique M.D.   On: 02/20/2017 07:53     Assessment/Plan: S/P Procedure(s) (LRB): CORONARY ARTERY BYPASS GRAFTING (CABG) x 3 (LIMA to LAD, SVG SEQUENTIALLY to RAMUS INTERMEDIATE and DISTAL CIRCUMFLEX) WITH ENDOSCOPIC HARVESTING OF RIGHT SAPHENOUS VEIN (N/A) TRANSESOPHAGEAL ECHOCARDIOGRAM W/O CARDIOVERSION  1.CV-She went into a fib with RVR yesterday. She is on Amiodarone, Milrinone, and Nitro drips and Lopressor 12.5 mg bid. Will restart Norvasc component of Exforge for better BP control and increase Lopressor. Wean Milrinone and Nitro. Will discuss with Dr. Servando Snare if should start Clonidine or Hydralazine to help with hypertension. 2. Pulmonary-On 4 liters of oxygen via [place]. Will wean as able. Chest tubes with 360 output last 24 hours;has had about 40 cc since 7 am. CXR this am shows bibasilar atelectasis, moderate right pleural effusion, stable cardiomegaly, and no pneumothorax. Hope to remove chest tubes soon. Encourage incentive spirometer. 3. Volume overload-Will give Lasix today 4. ABL anemia-H and H this am stable at 8.3 and 25.6 5.  Thrombocytopenia-platelets this am up to 93,000 6. Creatinine remains 1.48. She was on Dopamine drip. 7. DM-CBGs 116/120/117. On Insulin. Will restart Jentadueto and Glipizide once creatinine decreases. No recent HGA1C will obtain. 8. A line  Naquita Nappier M PA-C 02/20/2017 11:21 AM

## 2017-02-20 NOTE — Progress Notes (Signed)
Peripherally Inserted Central Catheter/Midline Placement  The IV Nurse has discussed with the patient and/or persons authorized to consent for the patient, the purpose of this procedure and the potential benefits and risks involved with this procedure.  The benefits include less needle sticks, lab draws from the catheter, and the patient may be discharged home with the catheter. Risks include, but not limited to, infection, bleeding, blood clot (thrombus formation), and puncture of an artery; nerve damage and irregular heartbeat and possibility to perform a PICC exchange if needed/ordered by physician.  Alternatives to this procedure were also discussed.  Bard Power PICC patient education guide, fact sheet on infection prevention and patient information card has been provided to patient /or left at bedside.    PICC/Midline Placement Documentation  PICC Double Lumen [id number] PICC Right Basilic 42 cm 2 cm (Active)  Indication for Insertion or Continuance of Line Chronic illness with exacerbations (CF, Sickle Cell, etc.) 02/20/2017  4:00 PM  Exposed Catheter (cm) 2 cm 02/20/2017  4:00 PM  Dressing Change Due 02/27/17 02/20/2017  4:00 PM       [name] 02/20/2017, 4:20 PM

## 2017-02-20 NOTE — Progress Notes (Signed)
      [address]       [place] [address]             [phone]      In good spirits, visiting with family  BP (!) 150/55 (BP Location: Left Arm)   Pulse 71   Temp 98.3 F (36.8 C) (Oral)   Resp (!) 23   Ht 5\' 1"  (1.549 m)   Wt 190 lb 4.1 oz (86.3 kg)   SpO2 97%   BMI 35.95 kg/m    Intake/Output Summary (Last 24 hours) at 02/20/17 1654 Last data filed at 02/20/17 1600  Gross per 24 hour  Intake           1224.6 ml  Output             1635 ml  Net           -410.4 ml   Maintaining SR on amiodarone  Remo Lipps C. Roxan Hockey, MD [facility] [phone]

## 2017-02-20 NOTE — Progress Notes (Signed)
Progress Note  Patient Name: Stephanie Hendricks Date of Encounter: 02/20/2017  Primary Cardiologist: Dr. Domenic Polite  Subjective   Awake and alert and no complaints.   Inpatient Medications    Scheduled Meds: . acetaminophen  1,000 mg Oral Q6H   Or  . acetaminophen (TYLENOL) oral liquid 160 mg/5 mL  1,000 mg Per Tube Q6H  . amLODipine  10 mg Oral Daily  . aspirin EC  81 mg Oral Daily   Or  . aspirin  81 mg Per Tube Daily  . atorvastatin  40 mg Oral q1800  . bisacodyl  10 mg Oral Daily   Or  . bisacodyl  10 mg Rectal Daily  . chlorhexidine gluconate (MEDLINE KIT)  15 mL Mouth Rinse BID  . docusate sodium  200 mg Oral Daily  . insulin aspart  0-24 Units Subcutaneous Q4H  . levalbuterol  0.63 mg Nebulization TID  . mouth rinse  15 mL Mouth Rinse BID  . metoprolol tartrate  25 mg Oral BID   Or  . metoprolol tartrate  25 mg Per Tube BID  . pantoprazole  40 mg Oral Daily  . sodium chloride flush  10-40 mL Intracatheter Q12H  . sodium chloride flush  3 mL Intravenous Q12H   Continuous Infusions: . sodium chloride Stopped (02/19/17 0938)  . amiodarone 30 mg/hr (02/20/17 0924)  . lactated ringers 10 mL/hr at 02/20/17 0800  . milrinone Stopped (02/20/17 1300)  . nitroGLYCERIN Stopped (02/20/17 1533)  . phenylephrine (NEO-SYNEPHRINE) Adult infusion Stopped (02/19/17 0720)   PRN Meds: sodium chloride, metoprolol, morphine injection, ondansetron (ZOFRAN) IV, oxyCODONE, sodium chloride flush, sodium chloride flush, traMADol   Vital Signs    Vitals:   02/20/17 1500 02/20/17 1518 02/20/17 1530 02/20/17 1600  BP: (!) 121/103  (!) 145/54 (!) 150/55  Pulse: 66  71 71  Resp: (!) 22  18 (!) 23  Temp:  98.3 F (36.8 C)    TempSrc:  Oral    SpO2: 96%  97% 97%  Weight:      Height:        Intake/Output Summary (Last 24 hours) at 02/20/17 1716 Last data filed at 02/20/17 1600  Gross per 24 hour  Intake             1194 ml  Output             1635 ml  Net             -441 ml    Filed Weights   02/18/17 0500 02/19/17 0600 02/20/17 0500  Weight: 201 lb 8 oz (91.4 kg) 199 lb 11.8 oz (90.6 kg) 190 lb 4.1 oz (86.3 kg)     ECG    NA - Personally Reviewed  Physical Exam   GEN: No acute distress.   Neck:  No JVD Cardiac: RRR, no  murmurs, rubs, or gallops.  Respiratory:  Clear to auscultation bilaterally. GI: Soft, nontender, non-distended  MS: No edema; No deformity. Neuro:  Nonfocal   Labs    Chemistry  Recent Labs Lab 02/18/17 0307 02/19/17 0400 02/20/17 0407  NA 139 140 142  K 4.5 4.1 3.6  CL 112* 109 107  CO2 23 24 27   GLUCOSE 117* 121* 134*  BUN 12 14 16   CREATININE 1.32* 1.44* 1.48*  CALCIUM 7.7* 8.0* 8.5*  GFRNONAA 38* 34* 33*  GFRAA 44* 39* 38*  ANIONGAP 4* 7 8     Hematology  Recent Labs Lab 02/18/17 0307 02/19/17 0400  02/20/17 0407  WBC 10.6* 10.3 10.2  RBC 3.22* 3.19* 3.33*  HGB 8.0* 7.8* 8.3*  HCT 25.1* 24.7* 25.6*  MCV 78.0 77.4* 76.9*  MCH 24.8* 24.5* 24.9*  MCHC 31.9 31.6 32.4  RDW 15.4 15.6* 16.2*  PLT 52* 48* 93*    Cardiac Enzymes  Recent Labs Lab 02/16/17 0242  TROPONINI 0.08*   No results for input(s): TROPIPOC in the last 168 hours.   BNP  Recent Labs Lab 02/16/17 0242  BNP 176.0*     DDimer No results for input(s): DDIMER in the last 168 hours.   Radiology    Dg Chest Port 1 View  Result Date: 02/20/2017 CLINICAL DATA:  Status post CABG 4 days ago. EXAM: PORTABLE CHEST 1 VIEW COMPARISON:  Portable chest x-ray of February 19, 2017. FINDINGS: Hypoinflation has not worsened since extubation of the trachea. There is bibasilar atelectasis. There is a moderate sized right pleural effusion layering posteriorly. The left chest tube is in stable position with the tip overlying the posterolateral aspect of the fourth rib. There is no pneumothorax. The cardiac silhouette remains enlarged. The pulmonary vascularity is slightly less engorged. There is calcification in the wall of the aortic arch. The  Swan-Ganz catheter has been removed as has the esophagogastric tube and the lower left chest tube. IMPRESSION: Persistent hypoinflation with bibasilar atelectasis. Moderate size right pleural effusion. No pneumothorax. Stable cardiomegaly with slightly decreased pulmonary vascular congestion and pulmonary edema. Electronically Signed   By: David  Martinique M.D.   On: 02/20/2017 07:53   Dg Chest Port 1 View  Result Date: 02/19/2017 CLINICAL DATA:  Emergency CABG for critical LEFT coronary stenosis. EXAM: PORTABLE CHEST 1 VIEW COMPARISON:  02/18/2017. FINDINGS: Cardiomegaly. BILATERAL lower lobe opacities appear worse, possibly representing atelectasis, infiltrate, effusion, or edema. Chest and mediastinal tubes in good position without pneumothorax. Swan-Ganz catheter tip main pulmonary artery. ETT 12 mm above carina, could be pulled back 20-30 mm. I no longer see the intra aortic balloon pump. IMPRESSION: Status post emergency CABG. Tubes and lines as described. Worsening aeration. Electronically Signed   By: Staci Righter M.D.   On: 02/19/2017 07:29    Cardiac Studies   Cath:   Left Main  Ost LM to LM lesion, 99% stenosed. The lesion is ulcerative.  Left Anterior Descending  Vessel is large.  Prox LAD lesion, 80% stenosed.  First Diagonal Branch  Vessel is small in size.  First Septal Branch  Vessel is small in size.  Second Diagonal Branch  Vessel is small in size.  Second Septal Branch  Vessel is small in size.  Ramus Intermedius  Vessel is small.  Left Circumflex  Vessel is large.  Ost Cx to Prox Cx lesion, 50% stenosed.  First Obtuse Marginal Branch  Vessel is moderate in size.  Ost 1st Mrg to 1st Mrg lesion, 90% stenosed.  Second Obtuse Marginal Branch  Vessel is small in size.  Third Obtuse Marginal Branch  Vessel is moderate in size.  Right Coronary Artery  Prox RCA to Mid RCA lesion, 10% stenosed.  Right Posterior Descending Artery  Vessel is small in size.    Patient  Profile     76 y.o. female transferred from [facility] with NSTEMI and found to have critical LM stenosis.  She was taken for urgent CABG.  IABP was placed.    Assessment & Plan    CAD s/p CABG:    HTN has been an issue.  However, currently controlled off of IV NTG.  Norvasc added and beta blocker will be increased tonight.  No other additions to therapy at this time.    ATRIAL FIB:  Went into fib last night and now converted to NSR with IV amiodarone.  Convert to PO in the AM.    Signed, Minus Breeding, MD  02/20/2017, 5:16 PM

## 2017-02-20 NOTE — Progress Notes (Signed)
Peripherally Inserted Central Catheter/Midline Placement  The IV Nurse has discussed with the patient and/or persons authorized to consent for the patient, the purpose of this procedure and the potential benefits and risks involved with this procedure.  The benefits include less needle sticks, lab draws from the catheter, and the patient may be discharged home with the catheter. Risks include, but not limited to, infection, bleeding, blood clot (thrombus formation), and puncture of an artery; nerve damage and irregular heartbeat and possibility to perform a PICC exchange if needed/ordered by physician.  Alternatives to this procedure were also discussed.  Bard Power PICC patient education guide, fact sheet on infection prevention and patient information card has been provided to patient /or left at bedside.    PICC/Midline Placement Documentation  PICC Double Lumen 02/20/17 PICC Right Cephalic 41 cm 0 cm (Active)  Indication for Insertion or Continuance of Line Poor Vasculature-patient has had multiple peripheral attempts or PIVs lasting less than 24 hours 02/20/2017  8:35 PM  Exposed Catheter (cm) 0 cm 02/20/2017  8:35 PM  Site Assessment Clean;Dry;Intact 02/20/2017  8:35 PM  Lumen #1 Status Blood return noted;Flushed;Saline locked 02/20/2017  8:35 PM  Lumen #2 Status Blood return noted;Flushed;Saline locked 02/20/2017  8:35 PM  Dressing Type Transparent 02/20/2017  8:35 PM  Dressing Status Clean;Dry;Intact;Antimicrobial disc in place 02/20/2017  8:35 PM  Dressing Intervention New dressing 02/20/2017  8:35 PM  Dressing Change Due 02/27/17 02/20/2017  8:35 PM       Aldona Lento L 02/20/2017, 8:58 PM

## 2017-02-21 ENCOUNTER — Inpatient Hospital Stay (HOSPITAL_COMMUNITY): Payer: Medicare Other

## 2017-02-21 DIAGNOSIS — I219 Acute myocardial infarction, unspecified: Secondary | ICD-10-CM

## 2017-02-21 LAB — ECHOCARDIOGRAM COMPLETE
Height: 61 in
Weight: 2977.09 oz

## 2017-02-21 LAB — GLUCOSE, CAPILLARY
GLUCOSE-CAPILLARY: 86 mg/dL (ref 65–99)
Glucose-Capillary: 107 mg/dL — ABNORMAL HIGH (ref 65–99)
Glucose-Capillary: 93 mg/dL (ref 65–99)
Glucose-Capillary: 171 mg/dL — ABNORMAL HIGH (ref 65–99)
Glucose-Capillary: 119 mg/dL — ABNORMAL HIGH (ref 65–99)
Glucose-Capillary: 208 mg/dL — ABNORMAL HIGH (ref 65–99)

## 2017-02-21 LAB — BASIC METABOLIC PANEL
Anion gap: 6 (ref 5–15)
BUN: 19 mg/dL (ref 6–20)
CHLORIDE: 110 mmol/L (ref 101–111)
CO2: 29 mmol/L (ref 22–32)
Calcium: 8.1 mg/dL — ABNORMAL LOW (ref 8.9–10.3)
Creatinine, Ser: 1.44 mg/dL — ABNORMAL HIGH (ref 0.44–1.00)
GFR calc non Af Amer: 34 mL/min — ABNORMAL LOW (ref >60)
GFR, EST AFRICAN AMERICAN: 39 mL/min — AB (ref >60)
Glucose, Bld: 103 mg/dL — ABNORMAL HIGH (ref 65–99)
POTASSIUM: 3.4 mmol/L — AB (ref 3.5–5.1)
Sodium: 145 mmol/L (ref 135–145)

## 2017-02-21 LAB — CBC
HCT: 23.8 % — ABNORMAL LOW (ref 36.0–46.0)
Hemoglobin: 7.8 g/dL — ABNORMAL LOW (ref 12.0–15.0)
MCH: 24.8 pg — ABNORMAL LOW (ref 26.0–34.0)
MCHC: 32.8 g/dL (ref 30.0–36.0)
MCV: 75.6 fL — AB (ref 78.0–100.0)
PLATELETS: 118 10*3/uL — AB (ref 150–400)
RBC: 3.15 MIL/uL — AB (ref 3.87–5.11)
RDW: 16.5 % — AB (ref 11.5–15.5)
WBC: 9.7 10*3/uL (ref 4.0–10.5)

## 2017-02-21 LAB — COOXEMETRY PANEL
Carboxyhemoglobin: 1 % (ref 0.5–1.5)
METHEMOGLOBIN: 0.9 % (ref 0.0–1.5)
O2 Saturation: 61.6 %
TOTAL HEMOGLOBIN: 7.2 g/dL — AB (ref 12.0–16.0)

## 2017-02-21 MED ORDER — POTASSIUM CHLORIDE CRYS ER 20 MEQ PO TBCR
20.0000 meq | EXTENDED_RELEASE_TABLET | Freq: Four times a day (QID) | ORAL | Status: DC
  Administered 2017-02-21: 20 meq via ORAL
  Filled 2017-02-21: qty 1

## 2017-02-21 MED ORDER — POTASSIUM CHLORIDE CRYS ER 20 MEQ PO TBCR
40.0000 meq | EXTENDED_RELEASE_TABLET | Freq: Once | ORAL | Status: AC
  Administered 2017-02-21: 40 meq via ORAL
  Filled 2017-02-21: qty 2

## 2017-02-21 MED ORDER — FUROSEMIDE 10 MG/ML IJ SOLN
40.0000 mg | Freq: Two times a day (BID) | INTRAMUSCULAR | Status: DC
  Administered 2017-02-21 (×2): 40 mg via INTRAVENOUS
  Filled 2017-02-21 (×2): qty 4

## 2017-02-21 MED ORDER — AMIODARONE HCL 200 MG PO TABS
200.0000 mg | ORAL_TABLET | Freq: Two times a day (BID) | ORAL | Status: DC
  Administered 2017-02-21 – 2017-02-24 (×7): 200 mg via ORAL
  Filled 2017-02-21 (×7): qty 1

## 2017-02-21 MED ORDER — FOLIC ACID 1 MG PO TABS
1.0000 mg | ORAL_TABLET | Freq: Every day | ORAL | Status: DC
  Administered 2017-02-21 – 2017-02-23 (×3): 1 mg via ORAL
  Filled 2017-02-21 (×3): qty 1

## 2017-02-21 MED ORDER — POTASSIUM CHLORIDE 20 MEQ/15ML (10%) PO SOLN: 20.0000 meq | Freq: Four times a day (QID) | ORAL | Status: DC

## 2017-02-21 MED ORDER — FERROUS SULFATE 325 (65 FE) MG PO TABS
325.0000 mg | ORAL_TABLET | Freq: Every day | ORAL | Status: DC
  Administered 2017-02-21 – 2017-02-23 (×3): 325 mg via ORAL
  Filled 2017-02-21 (×3): qty 1

## 2017-02-21 MED ORDER — POTASSIUM CHLORIDE CRYS ER 20 MEQ PO TBCR
20.0000 meq | EXTENDED_RELEASE_TABLET | Freq: Two times a day (BID) | ORAL | Status: DC
  Administered 2017-02-21 (×2): 20 meq via ORAL
  Filled 2017-02-21 (×2): qty 1

## 2017-02-21 NOTE — Clinical Social Work Note (Signed)
Clinical Social Work Assessment  Patient Details  Name: Stephanie Hendricks MRN: [id number] Date of Birth: 01-14-1941  Date of referral:  02/21/17               Reason for consult:  Discharge Planning                Permission sought to share information with:  Family Supports Permission granted to share information::  Yes, Verbal Permission Granted  Name::     Athalee Esterline  Agency::     Relationship::  son  Contact Information:  [phone]  Housing/Transportation Living arrangements for the past 2 months:  Single Family Home Source of Information:  Patient Patient Interpreter Needed:  None Criminal Activity/Legal Involvement Pertinent to Current Situation/Hospitalization:  No - Comment as needed Significant Relationships:  Adult Children Lives with:  Self, Adult Children, Relatives Do you feel safe going back to the place where you live?  Yes Need for family participation in patient care:  Yes (Comment)  Care giving concerns:  Patient stated she lives at home with adult son, granddaughter and Sales executive / plan:  CSW met pateint at bedside to offer support and discuss patients needs at discharge. Patient stated she is agreeable to go to SNF but would like something closer to home. CSW to faxed out referral to SNF in the area. CSW to follow up with patient once bed offers are available. CSW remains available for support and to facilitate patient discharge needs once medically stable.   Employment status:  Retired Forensic scientist:  Medicare PT Recommendations:  [place] / Referral to community resources:  [place]  Patient/Family's Response to care: Patient verbalized appreciation and understanding for CSW role and involvement in care. Patient agreeable with current discharge plan to SNF  Patient/Family's Understanding of and Emotional Response to Diagnosis, Current Treatment, and Prognosis:  Patient  with good understanding of current medical state and limitations around most recent hospitalization.  Emotional Assessment Appearance:  Appears stated age Attitude/Demeanor/Rapport:  Other Affect (typically observed):  Pleasant Orientation:  Oriented to Self, Oriented to Situation, Oriented to Place, Oriented to  Time Alcohol / Substance use:  Not Applicable Psych involvement (Current and /or in the community):  No (Comment)  Discharge Needs  Concerns to be addressed:  No discharge needs identified Readmission within the last 30 days:  No Current discharge risk:  None Barriers to Discharge:  No Barriers Identified   Wende Neighbors, LCSW 02/21/2017, 1:35 PM

## 2017-02-21 NOTE — Progress Notes (Signed)
Physical Therapy Treatment Patient Details Name: Stephanie Hendricks MRN: [id number] DOB: 1941-11-29 Today's Date: 02/21/2017    History of Present Illness Stephanie Hendricks is a very pleasant 76 yo woman with past medical history of sinus bradycardia, CKD, DM, HTN, hyperlipidemia who is coming as a transfer from [facility] for high risk NSTEMI. 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     PT Comments    Pt able to progress her activity with ambulation of 80 ft using eva walker. Pt did become fatigued by end of ambulation but SpO2 remaining in 90s throughout session on 2L O2. PT to continue to follow and progress mobility and ambulation as tolerated.    Follow Up Recommendations  SNF;Supervision/Assistance - 24 hour     Equipment Recommendations  Rolling walker with 5" wheels    Recommendations for Other Services       Precautions / Restrictions Precautions Precautions: Fall;Sternal Precaution Comments: pt educated on sternal precautions Restrictions Weight Bearing Restrictions: Yes Other Position/Activity Restrictions: bilat UE no push/pulling due to sternal precautions    Mobility  Bed Mobility Overal bed mobility: Needs Assistance Bed Mobility: Sit to Supine       Sit to supine: +2 for physical assistance;Max assist   General bed mobility comments: assist needed with trunk and LEs, pt up in chair upon arrival   Transfers Overall transfer level: Needs assistance Equipment used: 2 person hand held assist Transfers: Sit to/from Stand Sit to Stand: +2 physical assistance;Mod assist         General transfer comment: cues for sternal precautions  Ambulation/Gait Ambulation/Gait assistance: Min assist;+2 safety/equipment (assist with lines and chair follow) Ambulation Distance (Feet): 80 Feet Assistive device:  (eva walker) Gait Pattern/deviations: Step-through pattern;Decreased step length - right;Decreased step length - left Gait velocity: decreased   General Gait Details:  assist provided at walker for stability, on 2L O2 with sats in 90s throughout.    Stairs            Wheelchair Mobility    Modified Rankin (Stroke Patients Only)       Balance Overall balance assessment: Needs assistance Sitting-balance support: Feet supported;No upper extremity supported Sitting balance-Leahy Scale: Fair     Standing balance support: Bilateral upper extremity supported Standing balance-Leahy Scale: Poor Standing balance comment: requiring physical assist for balance                    Cognition Arousal/Alertness: Awake/alert Behavior During Therapy: WFL for tasks assessed/performed Overall Cognitive Status: Within Functional Limits for tasks assessed                      Exercises      General Comments        Pertinent Vitals/Pain Pain Assessment: No/denies pain    Home Living                      Prior Function            PT Goals (current goals can now be found in the care plan section) Acute Rehab PT Goals Patient Stated Goal: get walking and get home PT Goal Formulation: With patient Time For Goal Achievement: 02/27/17 Potential to Achieve Goals: Good Progress towards PT goals: Progressing toward goals    Frequency    Min 3X/week      PT Plan Current plan remains appropriate    Co-evaluation  End of Session Equipment Utilized During Treatment: Gait belt Activity Tolerance: Patient tolerated treatment well Patient left: in bed;with call bell/phone within reach;with SCD's reapplied Nurse Communication: Mobility status PT Visit Diagnosis: Difficulty in walking, not elsewhere classified (R26.2)     Time: [id number] PT Time Calculation (min) (ACUTE ONLY): 28 min  Charges:  $Gait Training: 8-22 mins $Therapeutic Activity: 8-22 mins                    G Codes:       Cassell Clement, PT, CSCS Pager [phone] Office [phone]  02/21/2017, 5:04 PM

## 2017-02-21 NOTE — Progress Notes (Signed)
Patient ID: Stephanie Hendricks, female   DOB: 09-17-1941, 76 y.o.   MRN: [id number] SICU Evening Rounds:  Hemodynamically stable in sinus rhythm  Urine output ok  No new problems today.

## 2017-02-21 NOTE — Clinical Social Work Placement (Signed)
   CLINICAL SOCIAL WORK PLACEMENT  NOTE  Date:  02/21/2017  Patient Details  Name: Stephanie Hendricks MRN: [id number] Date of Birth: 1941/05/05  Clinical Social Work is seeking post-discharge placement for this patient at the [place] level of care (*CSW will initial, date and re-position this form in  chart as items are completed):  Yes   Patient/family provided with [facility] Work Department's list of facilities offering this level of care within the geographic area requested by the patient (or if unable, by the patient's family).  Yes   Patient/family informed of their freedom to choose among providers that offer the needed level of care, that participate in Medicare, Medicaid or managed care program needed by the patient, have an available bed and are willing to accept the patient.  Yes   Patient/family informed of [facility]'s ownership interest in [facility] and [facility], as well as of the fact that they are under no obligation to receive care at these facilities.  PASRR submitted to EDS on       PASRR number received on       Existing PASRR number confirmed on       FL2 transmitted to all facilities in geographic area requested by pt/family on       FL2 transmitted to all facilities within larger geographic area on       Patient informed that his/her managed care company has contracts with or will negotiate with certain facilities, including the following:            Patient/family informed of bed offers received.  Patient chooses bed at       Physician recommends and patient chooses bed at      Patient to be transferred to   on  .  Patient to be transferred to facility by       Patient family notified on   of transfer.  Name of family member notified:        PHYSICIAN Please sign FL2     Additional Comment:    _______________________________________________ Wende Neighbors, LCSW 02/21/2017, 1:59 PM

## 2017-02-21 NOTE — Progress Notes (Addendum)
TCTS DAILY ICU PROGRESS NOTE                   [address]            [place] [address]          [phone]   5 Days Post-Op Procedure(s) (LRB): CORONARY ARTERY BYPASS GRAFTING (CABG) x 3 (LIMA to LAD, SVG SEQUENTIALLY to RAMUS INTERMEDIATE and DISTAL CIRCUMFLEX) WITH ENDOSCOPIC HARVESTING OF RIGHT SAPHENOUS VEIN (N/A) TRANSESOPHAGEAL ECHOCARDIOGRAM W/O CARDIOVERSION  Total Length of Stay:  LOS: 5 days   Subjective: Patient sitting in chair. She walked about 60 feet earlier this am. She states she had a bowel movement last night.  Objective: Vital signs in last 24 hours: Temp:  [98.2 F (36.8 C)-99 F (37.2 C)] 98.8 F (37.1 C) (03/01 0730) Pulse Rate:  [63-84] 68 (03/01 0700) Cardiac Rhythm: Normal sinus rhythm (03/01 0500) Resp:  [14-25] 14 (03/01 0700) BP: (94-182)/(45-145) 160/61 (03/01 0700) SpO2:  [94 %-100 %] 100 % (03/01 0700) Arterial Line BP: (155-189)/(47-64) 189/63 (02/28 1200) Weight:  [186 lb 1.1 oz (84.4 kg)] 186 lb 1.1 oz (84.4 kg) (03/01 0500)  Filed Weights   02/19/17 0600 02/20/17 0500 02/21/17 0500  Weight: 199 lb 11.8 oz (90.6 kg) 190 lb 4.1 oz (86.3 kg) 186 lb 1.1 oz (84.4 kg)    Weight change: -4 lb 3 oz (-1.9 kg)      Intake/Output from previous day: 02/28 0701 - 03/01 0700 In: 1180.2 [P.O.:240; I.V.:940.2] Out: 2260 [Urine:2200; Chest Tube:60]  Intake/Output this shift: No intake/output data recorded.  Current Meds: Scheduled Meds: . acetaminophen  1,000 mg Oral Q6H   Or  . acetaminophen (TYLENOL) oral liquid 160 mg/5 mL  1,000 mg Per Tube Q6H  . amLODipine  10 mg Oral Daily  . aspirin EC  81 mg Oral Daily   Or  . aspirin  81 mg Per Tube Daily  . atorvastatin  40 mg Oral q1800  . bisacodyl  10 mg Oral Daily   Or  . bisacodyl  10 mg Rectal Daily  . chlorhexidine gluconate (MEDLINE KIT)  15 mL Mouth Rinse BID  . Chlorhexidine Gluconate Cloth  6 each Topical Daily  . docusate sodium  200 mg Oral Daily  . insulin  aspart  0-24 Units Subcutaneous Q4H  . levalbuterol  0.63 mg Nebulization TID  . mouth rinse  15 mL Mouth Rinse BID  . metoprolol tartrate  25 mg Oral BID   Or  . metoprolol tartrate  25 mg Per Tube BID  . pantoprazole  40 mg Oral Daily  . potassium chloride  20 mEq Oral Q6H   Or  . potassium chloride  20 mEq Oral Q6H  . sodium chloride flush  10-40 mL Intracatheter Q12H  . sodium chloride flush  3 mL Intravenous Q12H   Continuous Infusions: . sodium chloride Stopped (02/19/17 0938)  . amiodarone 30 mg/hr (02/20/17 2204)  . lactated ringers 10 mL/hr at 02/20/17 0800  . nitroGLYCERIN Stopped (02/20/17 1533)  . phenylephrine (NEO-SYNEPHRINE) Adult infusion Stopped (02/19/17 0720)   PRN Meds:.sodium chloride, metoprolol, morphine injection, ondansetron (ZOFRAN) IV, oxyCODONE, sodium chloride flush, sodium chloride flush, sodium chloride flush, traMADol  General appearance: alert, cooperative and no distress Neurologic: intact Heart: RRR Lungs: Some crackles Extremities: Bilateral LE edema Wound: Aquacel intact;RLE wounds are clean and dry  Lab Results: CBC:  Recent Labs  02/20/17 0407 02/21/17 0500  WBC 10.2 9.7  HGB 8.3* 7.8*  HCT 25.6* 23.8*  PLT 93* 118*   BMET:   Recent Labs  02/20/17 0407 02/21/17 0500  NA 142 145  K 3.6 3.4*  CL 107 110  CO2 27 29  GLUCOSE 134* 103*  BUN 16 19  CREATININE 1.48* 1.44*  CALCIUM 8.5* 8.1*    CMET: Lab Results  Component Value Date   WBC 9.7 02/21/2017   HGB 7.8 (L) 02/21/2017   HCT 23.8 (L) 02/21/2017   PLT 118 (L) 02/21/2017   GLUCOSE 103 (H) 02/21/2017   CHOL 70 02/16/2017   TRIG 62 02/16/2017   HDL 22 (L) 02/16/2017   LDLCALC 36 02/16/2017   ALT 11 11/06/2014   AST 13 11/06/2014   NA 145 02/21/2017   K 3.4 (L) 02/21/2017   CL 110 02/21/2017   CREATININE 1.44 (H) 02/21/2017   BUN 19 02/21/2017   CO2 29 02/21/2017   TSH 0.785 02/16/2017   INR 1.52 02/16/2017   HGBA1C 6.5 (H) 11/06/2014      PT/INR:  No results for input(s): LABPROT, INR in the last 72 hours. Radiology: Dg Chest Port 1 View  Result Date: 02/21/2017 CLINICAL DATA:  Pneumothorax. EXAM: PORTABLE CHEST 1 VIEW COMPARISON:  02/20/2017. FINDINGS: Right IJ sheath in stable position. Right PICC line noted with tip projected over right atrium. Prior CABG. Cardiomegaly with bilateral from interstitial prominence and bilateral pleural effusions. Findings consistent CHF. No pneumothorax . IMPRESSION: 1. Right IJ sheath noted in stable position. Interim placement of right PICC line, its tip is over the right atrium. 2. Prior CABG. Cardiomegaly with slight progression of bilateral pulmonary interstitial prominence and bilateral pleural effusions consistent with progressive CHF . Electronically Signed   By: Marcello Moores  Register   On: 02/21/2017 07:41     Assessment/Plan: S/P Procedure(s) (LRB): CORONARY ARTERY BYPASS GRAFTING (CABG) x 3 (LIMA to LAD, SVG SEQUENTIALLY to RAMUS INTERMEDIATE and DISTAL CIRCUMFLEX) WITH ENDOSCOPIC HARVESTING OF RIGHT SAPHENOUS VEIN (N/A) TRANSESOPHAGEAL ECHOCARDIOGRAM W/O CARDIOVERSION  1.CV-Post op a fib with RVR. Maintaining SR in the 60's this am. She is on Amiodarone drip,Lopressor 25 mg bid, and Norvasc 10 mg daily. Will stop Amiodarone drip and start oral.  2. Pulmonary-On 2 liters of oxygen via [place]. Will wean as able. Chest tubes with 360 output last 24 hours;has had about 60 cc since 7 am. CXR this am shows bibasilar atelectasis, moderate right pleural effusion, stable cardiomegaly, and no pneumothorax, vascular congestion. Hope to remove chest tubes soon. Encourage incentive spirometer. 3. Volume overload-Will give Lasix 40 mg IV bid today 4. ABL anemia-H and H decreased to  7.8 and 23.8. Will start Folic acid and oral Ferrous 5. Thrombocytopenia-platelets this am up to 118,000 6. Creatinine remains stable at 1.44 7. DM-CBGs 86/107/93. On Insulin. Will restart Jentadueto and Glipizide once creatinine decreases.  No recent HGA1C will obtain. 8. Supplement potassium 9. Remove foley  ZIMMERMAN,Stephanie M PA-C 02/21/2017 7:57 AM   plts improving Add lovenox for dvt and watch plts Ct out I have seen and examined Stephanie Hendricks and agree with the above assessment  and plan.  Grace Isaac MD Beeper [phone] Office [phone] 02/21/2017 8:21 AM

## 2017-02-21 NOTE — Plan of Care (Signed)
Problem: Activity: Goal: Risk for activity intolerance will decrease Outcome: Progressing Pt to be up OOBx3 today  Problem: Bowel/Gastric: Goal: Gastrointestinal status for postoperative course will improve Outcome: Progressing BM 02/21/2017  Problem: Cardiac: Goal: Hemodynamic stability will improve Outcome: Progressing Within parameters Goal: Ability to maintain an adequate cardiac output will improve Outcome: Progressing Within parameters

## 2017-02-21 NOTE — Progress Notes (Signed)
  Echocardiogram 2D Echocardiogram has been performed.  Stephanie Hendricks, Stephanie Hendricks 02/21/2017, 4:14 PM

## 2017-02-21 NOTE — Progress Notes (Signed)
Progress Note  Patient Name: Stephanie Hendricks Date of Encounter: 02/21/2017  Primary Cardiologist: Dr. Domenic Polite  Subjective   Awake and alert and no complaints.  She walked today and chest tube out.   Inpatient Medications    Scheduled Meds: . acetaminophen  1,000 mg Oral Q6H   Or  . acetaminophen (TYLENOL) oral liquid 160 mg/5 mL  1,000 mg Per Tube Q6H  . amiodarone  200 mg Oral BID  . amLODipine  10 mg Oral Daily  . aspirin EC  81 mg Oral Daily   Or  . aspirin  81 mg Per Tube Daily  . atorvastatin  40 mg Oral q1800  . bisacodyl  10 mg Oral Daily   Or  . bisacodyl  10 mg Rectal Daily  . chlorhexidine gluconate (MEDLINE KIT)  15 mL Mouth Rinse BID  . Chlorhexidine Gluconate Cloth  6 each Topical Daily  . docusate sodium  200 mg Oral Daily  . ferrous sulfate  325 mg Oral Q breakfast  . folic acid  1 mg Oral Daily  . furosemide  40 mg Intravenous BID  . insulin aspart  0-24 Units Subcutaneous Q4H  . levalbuterol  0.63 mg Nebulization TID  . mouth rinse  15 mL Mouth Rinse BID  . metoprolol tartrate  25 mg Oral BID   Or  . metoprolol tartrate  25 mg Per Tube BID  . pantoprazole  40 mg Oral Daily  . potassium chloride  20 mEq Oral BID  . sodium chloride flush  10-40 mL Intracatheter Q12H  . sodium chloride flush  3 mL Intravenous Q12H   Continuous Infusions: . sodium chloride Stopped (02/19/17 0938)  . lactated ringers Stopped (02/21/17 1000)  . nitroGLYCERIN Stopped (02/20/17 1533)  . phenylephrine (NEO-SYNEPHRINE) Adult infusion Stopped (02/19/17 0720)   PRN Meds: sodium chloride, metoprolol, morphine injection, ondansetron (ZOFRAN) IV, oxyCODONE, sodium chloride flush, sodium chloride flush, sodium chloride flush, traMADol   Vital Signs    Vitals:   02/21/17 1300 02/21/17 1331 02/21/17 1400 02/21/17 1626  BP: (!) 139/53  (!) 141/57   Pulse: (!) 55  (!) 57   Resp: (!) 21  (!) 23   Temp: 98.3 F (36.8 C)   97.9 F (36.6 C)  TempSrc: Oral   Oral  SpO2: 98%  95% 96%   Weight:      Height:        Intake/Output Summary (Last 24 hours) at 02/21/17 1748 Last data filed at 02/21/17 1200  Gross per 24 hour  Intake            533.9 ml  Output             1840 ml  Net          -1306.1 ml   Filed Weights   02/19/17 0600 02/20/17 0500 02/21/17 0500  Weight: 199 lb 11.8 oz (90.6 kg) 190 lb 4.1 oz (86.3 kg) 186 lb 1.1 oz (84.4 kg)     TELE    NSR - Personally Reviewed  Physical Exam   GEN: No acute distress.   Neck:  No JVD Cardiac: RRR, no  murmurs, rubs, or gallops.  Respiratory:  Clear to auscultation bilaterally. GI: Soft, nontender, non-distended  MS: No edema; No deformity. Neuro:  Nonfocal   Labs    Chemistry  Recent Labs Lab 02/19/17 0400 02/20/17 0407 02/21/17 0500  NA 140 142 145  K 4.1 3.6 3.4*  CL 109 107 110  CO2 24  27 29  GLUCOSE 121* 134* 103*  BUN 14 16 19   CREATININE 1.44* 1.48* 1.44*  CALCIUM 8.0* 8.5* 8.1*  GFRNONAA 34* 33* 34*  GFRAA 39* 38* 39*  ANIONGAP 7 8 6      Hematology  Recent Labs Lab 02/19/17 0400 02/20/17 0407 02/21/17 0500  WBC 10.3 10.2 9.7  RBC 3.19* 3.33* 3.15*  HGB 7.8* 8.3* 7.8*  HCT 24.7* 25.6* 23.8*  MCV 77.4* 76.9* 75.6*  MCH 24.5* 24.9* 24.8*  MCHC 31.6 32.4 32.8  RDW 15.6* 16.2* 16.5*  PLT 48* 93* 118*    Cardiac Enzymes  Recent Labs Lab 02/16/17 0242  TROPONINI 0.08*   No results for input(s): TROPIPOC in the last 168 hours.   BNP  Recent Labs Lab 02/16/17 0242  BNP 176.0*     DDimer No results for input(s): DDIMER in the last 168 hours.   Radiology    Dg Chest Port 1 View  Result Date: 02/21/2017 CLINICAL DATA:  Pneumothorax. EXAM: PORTABLE CHEST 1 VIEW COMPARISON:  02/20/2017. FINDINGS: Right IJ sheath in stable position. Right PICC line noted with tip projected over right atrium. Prior CABG. Cardiomegaly with bilateral from interstitial prominence and bilateral pleural effusions. Findings consistent CHF. No pneumothorax . IMPRESSION: 1. Right IJ  sheath noted in stable position. Interim placement of right PICC line, its tip is over the right atrium. 2. Prior CABG. Cardiomegaly with slight progression of bilateral pulmonary interstitial prominence and bilateral pleural effusions consistent with progressive CHF . Electronically Signed   By: Marcello Moores  Register   On: 02/21/2017 07:41   Dg Chest Port 1 View  Result Date: 02/20/2017 CLINICAL DATA:  Status post CABG 4 days ago. EXAM: PORTABLE CHEST 1 VIEW COMPARISON:  Portable chest x-ray of February 19, 2017. FINDINGS: Hypoinflation has not worsened since extubation of the trachea. There is bibasilar atelectasis. There is a moderate sized right pleural effusion layering posteriorly. The left chest tube is in stable position with the tip overlying the posterolateral aspect of the fourth rib. There is no pneumothorax. The cardiac silhouette remains enlarged. The pulmonary vascularity is slightly less engorged. There is calcification in the wall of the aortic arch. The Swan-Ganz catheter has been removed as has the esophagogastric tube and the lower left chest tube. IMPRESSION: Persistent hypoinflation with bibasilar atelectasis. Moderate size right pleural effusion. No pneumothorax. Stable cardiomegaly with slightly decreased pulmonary vascular congestion and pulmonary edema. Electronically Signed   By: David  Martinique M.D.   On: 02/20/2017 07:53    Cardiac Studies   Cath:   Left Main  Ost LM to LM lesion, 99% stenosed. The lesion is ulcerative.  Left Anterior Descending  Vessel is large.  Prox LAD lesion, 80% stenosed.  First Diagonal Branch  Vessel is small in size.  First Septal Branch  Vessel is small in size.  Second Diagonal Branch  Vessel is small in size.  Second Septal Branch  Vessel is small in size.  Ramus Intermedius  Vessel is small.  Left Circumflex  Vessel is large.  Ost Cx to Prox Cx lesion, 50% stenosed.  First Obtuse Marginal Branch  Vessel is moderate in size.  Ost 1st  Mrg to 1st Mrg lesion, 90% stenosed.  Second Obtuse Marginal Branch  Vessel is small in size.  Third Obtuse Marginal Branch  Vessel is moderate in size.  Right Coronary Artery  Prox RCA to Mid RCA lesion, 10% stenosed.  Right Posterior Descending Artery  Vessel is small in size.   ECHO:  02/21/17 - Left ventricle: The cavity size was normal. There was moderate   concentric hypertrophy. Systolic function was vigorous. The   estimated ejection fraction was in the range of 65% to 70%. Wall   motion was normal; there were no regional wall motion   abnormalities. Doppler parameters are consistent with abnormal   left ventricular relaxation (grade 1 diastolic dysfunction). - Aortic valve: There was mild stenosis. Peak velocity (S): 249   cm/s. Mean gradient (S): 12 mm Hg. Valve area (VTI): 1.65 cm^2.   Valve area (Vmax): 1.56 cm^2. Valve area (Vmean): 1.64 cm^2. - Left atrium: The atrium was mildly dilated. Volume/bsa, S: 35   ml/m^2. - Tricuspid valve: There was moderate regurgitation. - Pulmonary arteries: Systolic pressure was mildly increased.  Patient Profile     76 y.o. female transferred from [facility] with NSTEMI and found to have critical LM stenosis.  She was taken for urgent CABG.  IABP was placed.    Assessment & Plan    CAD s/p CABG:    HTN has been an issue and still running slightly high.  However creat has been elevated.  NL LV as above.   She was on clonidine prior to admission and might need to have this restarted.  Will hold off today as BP was still somewhat labile.    ATRIAL FIB:  In NSR on po amiodarone.  I don't see an indication for DOAC since this was a brief isolated episode.     Signed, Minus Breeding, MD  02/21/2017, 5:48 PM

## 2017-02-21 NOTE — Care Management Note (Addendum)
Case Management Note  Patient Details  Name: Stephanie Hendricks MRN: [id number] Date of Birth: [birth year]  Subjective/Objective:   From home with son , Rupa Schimmer [id number] home, cell [phone] and grand daughter, s/p emergent CABG x 3, extubated, medialstinal chest tube d'cd, has plueral chest tube to suction.  She has a rolling walker and a cane at home, she has a PCP , Dr. Nevada Crane, per pt eval rec SNF, CSW consulted.   NCM will cont to follow for dc needs.            3/1 [place], BSN - post operatively afib with rvr, in SR this am, on amio drip will transition to po, on 2 liters o2 [place], cxr with bibasilar atx, no ptx, vascular congestion.  Volume overload- iv lasix bid today.  abla 7.8/23.8, thrombocytopenia- plts improving, add lovenox for dvt and watch plts.  Chest tube dc'd. ambualted 60 feet.  NCM will cont to follow for dc needs.     3/6- 1448 Tomi Bamberger RN BSN- NSTEMI s/p CABG x 3 on 2/24, off drips, on cvp monitoring, wbc elevated, urine cx pending.  She had an episode of hypoxia 84% while ambulating with cardiac rehab with increased wob.  Her oxygen went from 2 liters to 5 liters, cxr with worsening edema transferred from 2w back to ICU.  NCM will cont to follow for dc needs.  Per pt eval rec SNF.                 Action/Plan:  3/9 Carles Collet RN CM Patient with bed at [facility], anticipate SNF DC Saturday.   Expected Discharge Date:                  Expected Discharge Plan:  Skilled Nursing Facility  In-House Referral:  Clinical Social Work  Discharge planning Services  CM Consult  Post Acute Care Choice:    Choice offered to:     DME Arranged:    DME Agency:     HH Arranged:    [place] Agency:     Status of Service:  In process, will continue to follow  If discussed at Long Length of Stay Meetings, dates discussed:    Additional Comments:  Zenon Mayo, RN 02/21/2017, 11:57 AM

## 2017-02-22 DIAGNOSIS — I519 Heart disease, unspecified: Secondary | ICD-10-CM

## 2017-02-22 DIAGNOSIS — N179 Acute kidney failure, unspecified: Secondary | ICD-10-CM

## 2017-02-22 DIAGNOSIS — I10 Essential (primary) hypertension: Secondary | ICD-10-CM

## 2017-02-22 DIAGNOSIS — E785 Hyperlipidemia, unspecified: Secondary | ICD-10-CM

## 2017-02-22 DIAGNOSIS — I5189 Other ill-defined heart diseases: Secondary | ICD-10-CM

## 2017-02-22 DIAGNOSIS — N183 Chronic kidney disease, stage 3 unspecified: Secondary | ICD-10-CM

## 2017-02-22 DIAGNOSIS — R001 Bradycardia, unspecified: Secondary | ICD-10-CM

## 2017-02-22 DIAGNOSIS — D72829 Elevated white blood cell count, unspecified: Secondary | ICD-10-CM

## 2017-02-22 DIAGNOSIS — R0682 Tachypnea, not elsewhere classified: Secondary | ICD-10-CM

## 2017-02-22 DIAGNOSIS — Z951 Presence of aortocoronary bypass graft: Secondary | ICD-10-CM

## 2017-02-22 DIAGNOSIS — D62 Acute posthemorrhagic anemia: Secondary | ICD-10-CM

## 2017-02-22 LAB — HEMOGLOBIN A1C
Hgb A1c MFr Bld: 6 % — ABNORMAL HIGH (ref 4.8–5.6)
Mean Plasma Glucose: 126 mg/dL

## 2017-02-22 LAB — CBC
HEMATOCRIT: 25.3 % — AB (ref 36.0–46.0)
HEMOGLOBIN: 8.2 g/dL — AB (ref 12.0–15.0)
MCH: 25 pg — ABNORMAL LOW (ref 26.0–34.0)
MCHC: 32.4 g/dL (ref 30.0–36.0)
MCV: 77.1 fL — ABNORMAL LOW (ref 78.0–100.0)
Platelets: 167 10*3/uL (ref 150–400)
RBC: 3.28 MIL/uL — ABNORMAL LOW (ref 3.87–5.11)
RDW: 17.4 % — ABNORMAL HIGH (ref 11.5–15.5)
WBC: 11.2 10*3/uL — AB (ref 4.0–10.5)

## 2017-02-22 LAB — BASIC METABOLIC PANEL
Anion gap: 10 (ref 5–15)
BUN: 21 mg/dL — ABNORMAL HIGH (ref 6–20)
CHLORIDE: 106 mmol/L (ref 101–111)
CO2: 28 mmol/L (ref 22–32)
Calcium: 8.5 mg/dL — ABNORMAL LOW (ref 8.9–10.3)
Creatinine, Ser: 1.46 mg/dL — ABNORMAL HIGH (ref 0.44–1.00)
GFR calc non Af Amer: 33 mL/min — ABNORMAL LOW (ref >60)
GFR, EST AFRICAN AMERICAN: 39 mL/min — AB (ref >60)
Glucose, Bld: 115 mg/dL — ABNORMAL HIGH (ref 65–99)
POTASSIUM: 4 mmol/L (ref 3.5–5.1)
SODIUM: 144 mmol/L (ref 135–145)

## 2017-02-22 LAB — GLUCOSE, CAPILLARY
GLUCOSE-CAPILLARY: 108 mg/dL — AB (ref 65–99)
GLUCOSE-CAPILLARY: 163 mg/dL — AB (ref 65–99)
GLUCOSE-CAPILLARY: 209 mg/dL — AB (ref 65–99)
GLUCOSE-CAPILLARY: 169 mg/dL — AB (ref 65–99)
Glucose-Capillary: 112 mg/dL — ABNORMAL HIGH (ref 65–99)
Glucose-Capillary: 75 mg/dL (ref 65–99)

## 2017-02-22 LAB — COOXEMETRY PANEL
Carboxyhemoglobin: 1 % (ref 0.5–1.5)
Methemoglobin: 1.3 % (ref 0.0–1.5)
O2 Saturation: 61.8 %
Total hemoglobin: 7.4 g/dL — ABNORMAL LOW (ref 12.0–16.0)

## 2017-02-22 MED ORDER — SODIUM CHLORIDE 0.9% FLUSH
3.0000 mL | Freq: Two times a day (BID) | INTRAVENOUS | Status: DC
  Administered 2017-02-27 – 2017-03-02 (×5): 3 mL via INTRAVENOUS

## 2017-02-22 MED ORDER — GUAIFENESIN ER 600 MG PO TB12
600.0000 mg | ORAL_TABLET | Freq: Two times a day (BID) | ORAL | Status: DC | PRN
  Administered 2017-03-01: 600 mg via ORAL
  Filled 2017-02-22: qty 1

## 2017-02-22 MED ORDER — ACETAMINOPHEN 325 MG PO TABS
650.0000 mg | ORAL_TABLET | Freq: Four times a day (QID) | ORAL | Status: DC | PRN
  Administered 2017-03-01: 650 mg via ORAL
  Filled 2017-02-22: qty 2

## 2017-02-22 MED ORDER — TRAMADOL HCL 50 MG PO TABS: 50.0000 mg | ORAL_TABLET | Freq: Four times a day (QID) | ORAL | Status: DC

## 2017-02-22 MED ORDER — ONDANSETRON HCL 4 MG/2ML IJ SOLN: 4.0000 mg | Freq: Four times a day (QID) | INTRAMUSCULAR | Status: DC | PRN

## 2017-02-22 MED ORDER — METOPROLOL TARTRATE 25 MG PO TABS
25.0000 mg | ORAL_TABLET | Freq: Two times a day (BID) | ORAL | Status: DC
  Administered 2017-02-22 – 2017-02-24 (×5): 25 mg via ORAL
  Filled 2017-02-22 (×5): qty 1

## 2017-02-22 MED ORDER — PANTOPRAZOLE SODIUM 40 MG PO TBEC
40.0000 mg | DELAYED_RELEASE_TABLET | Freq: Every day | ORAL | Status: DC
  Administered 2017-02-22 – 2017-03-02 (×9): 40 mg via ORAL
  Filled 2017-02-22 (×10): qty 1

## 2017-02-22 MED ORDER — ASPIRIN EC 81 MG PO TBEC
81.0000 mg | DELAYED_RELEASE_TABLET | Freq: Every day | ORAL | Status: DC
  Administered 2017-02-22 – 2017-03-02 (×9): 81 mg via ORAL
  Filled 2017-02-22 (×8): qty 1

## 2017-02-22 MED ORDER — LEVALBUTEROL HCL 0.63 MG/3ML IN NEBU
0.6300 mg | INHALATION_SOLUTION | Freq: Four times a day (QID) | RESPIRATORY_TRACT | Status: DC | PRN
  Administered 2017-02-25: 0.63 mg via RESPIRATORY_TRACT
  Filled 2017-02-22: qty 3

## 2017-02-22 MED ORDER — ENOXAPARIN SODIUM 30 MG/0.3ML ~~LOC~~ SOLN
30.0000 mg | SUBCUTANEOUS | Status: DC
  Administered 2017-02-22 – 2017-02-27 (×6): 30 mg via SUBCUTANEOUS
  Filled 2017-02-22 (×7): qty 0.3

## 2017-02-22 MED ORDER — TRAMADOL HCL 50 MG PO TABS
50.0000 mg | ORAL_TABLET | Freq: Four times a day (QID) | ORAL | Status: DC | PRN
  Administered 2017-02-22 – 2017-02-25 (×3): 50 mg via ORAL
  Filled 2017-02-22 (×3): qty 1

## 2017-02-22 MED ORDER — SODIUM CHLORIDE 0.9 % IV SOLN
250.0000 mL | INTRAVENOUS | Status: DC | PRN
  Administered 2017-02-27: 250 mL via INTRAVENOUS

## 2017-02-22 MED ORDER — POTASSIUM CHLORIDE CRYS ER 20 MEQ PO TBCR
20.0000 meq | EXTENDED_RELEASE_TABLET | Freq: Two times a day (BID) | ORAL | Status: DC
  Administered 2017-02-22 – 2017-02-24 (×5): 20 meq via ORAL
  Filled 2017-02-22 (×5): qty 1

## 2017-02-22 MED ORDER — FUROSEMIDE 40 MG PO TABS
40.0000 mg | ORAL_TABLET | Freq: Every day | ORAL | Status: DC
  Administered 2017-02-22 – 2017-02-24 (×3): 40 mg via ORAL
  Filled 2017-02-22 (×3): qty 1

## 2017-02-22 MED ORDER — INSULIN ASPART 100 UNIT/ML ~~LOC~~ SOLN
0.0000 [IU] | Freq: Three times a day (TID) | SUBCUTANEOUS | Status: DC
  Administered 2017-02-22: 4 [IU] via SUBCUTANEOUS
  Administered 2017-02-22: 8 [IU] via SUBCUTANEOUS
  Administered 2017-02-22 – 2017-02-23 (×4): 4 [IU] via SUBCUTANEOUS
  Administered 2017-02-23: 2 [IU] via SUBCUTANEOUS
  Administered 2017-02-24 (×3): 4 [IU] via SUBCUTANEOUS
  Administered 2017-02-24 – 2017-02-25 (×3): 2 [IU] via SUBCUTANEOUS
  Administered 2017-02-25 – 2017-02-26 (×2): 4 [IU] via SUBCUTANEOUS
  Administered 2017-02-26 (×2): 2 [IU] via SUBCUTANEOUS
  Administered 2017-02-27 (×2): 4 [IU] via SUBCUTANEOUS
  Administered 2017-02-27: 2 [IU] via SUBCUTANEOUS
  Administered 2017-02-28 (×2): 4 [IU] via SUBCUTANEOUS
  Administered 2017-02-28 – 2017-03-01 (×2): 8 [IU] via SUBCUTANEOUS
  Administered 2017-03-01: 4 [IU] via SUBCUTANEOUS
  Administered 2017-03-02: 2 [IU] via SUBCUTANEOUS
  Administered 2017-03-02: 4 [IU] via SUBCUTANEOUS

## 2017-02-22 MED ORDER — SODIUM CHLORIDE 0.9% FLUSH: 3.0000 mL | INTRAVENOUS | Status: DC | PRN

## 2017-02-22 MED ORDER — ONDANSETRON HCL 4 MG PO TABS: 4.0000 mg | ORAL_TABLET | Freq: Four times a day (QID) | ORAL | Status: DC | PRN

## 2017-02-22 MED ORDER — MOVING RIGHT ALONG BOOK
Freq: Once | Status: AC
  Administered 2017-02-22: 1
  Filled 2017-02-22: qty 1

## 2017-02-22 NOTE — Consult Note (Signed)
Physical Medicine and Rehabilitation Consult Reason for Consult: Debilitation after CABG/multi-medical Referring Physician: Dr. Servando Snare   HPI: Stephanie Hendricks is a 76 y.o. right handed female with past medical history of sinus bradycardia, CKD stage III, hypertension, hyperlipidemia. Per chart review patient lives with son and 31 year old granddaughter who works third shift. Reported to be independent prior to admission. One level home with 3 steps to entry. Presented 02/16/2017 to [facility] hospital with chest pain. EKG showed diffuse ST depressions with ST elevation. Troponin 0.08. Chest x-ray could not exclude mild left basilar atelectasis versus infiltrate. Patient was transferred to [facility] for further evaluation. Emergent cardiac catheterization completed found to have critical LM stenosis. Underwent emergent CABG 3 02/16/2017 per Dr. Servando Snare. Follow-up post echocardiogram showed ejection fraction 72% grade 1 diastolic dysfunction. Hospital course acute blood loss anemia 7.8-8.3 and monitored. Creatinine remained stable 1.46-1.48. Aspirin therapy initiated after CABG. Sternal precautions advised. Physical therapy evaluation completed. M.D. has requested physical medicine rehabilitation consult.    Review of Systems  Constitutional: Negative for chills and fever.  HENT: Negative for hearing loss and tinnitus.   Eyes: Negative for blurred vision and double vision.  Respiratory: Positive for shortness of breath.   Cardiovascular: Positive for leg swelling.  Gastrointestinal: Positive for constipation. Negative for nausea and vomiting.  Genitourinary: Positive for urgency. Negative for dysuria and hematuria.  Musculoskeletal: Positive for myalgias.  Skin: Negative for rash.  Neurological: Positive for weakness. Negative for seizures.       Occasional dizziness  All other systems reviewed and are negative.  Past Medical History:  Diagnosis Date  . Aortic stenosis      Minimal  . Bradycardia   . Chronic kidney disease, stage III (moderate)   . Degenerative joint disease   . Diabetes mellitus type II   . Essential hypertension, benign   . Hyperlipidemia   . Lower extremity edema   . Microcytic anemia   . Palpitations    Past Surgical History:  Procedure Laterality Date  . COLONOSCOPY  08/28/2011   sigmoid colon serated adenoma, next colonoscopy 08/2016  . COLONOSCOPY W/ POLYPECTOMY  2005   Dr. Tamela Oddi resected per patient  . CORONARY ARTERY BYPASS GRAFT N/A 02/16/2017   Procedure: CORONARY ARTERY BYPASS GRAFTING (CABG) x 3 (LIMA to LAD, SVG SEQUENTIALLY to RAMUS INTERMEDIATE and DISTAL CIRCUMFLEX) WITH ENDOSCOPIC HARVESTING OF RIGHT SAPHENOUS VEIN;  Surgeon: Grace Isaac, MD;  Location: [place];  Service: Open Heart Surgery;  Laterality: N/A;  . ESOPHAGOGASTRODUODENOSCOPY     remote, foreign body extraction  . ESOPHAGOGASTRODUODENOSCOPY  08/28/2011   hiatal hernia, erosion on fold of diaphragmatic hiatus (cameron lesion), antral and prepylori erosions, SB bx negative, gastric bx showed reactive gastropathy but no H.Pylori  . HEMORROIDECTOMY    . LEFT HEART CATH AND CORONARY ANGIOGRAPHY N/A 02/16/2017   Procedure: Left Heart Cath and Coronary Angiography;  Surgeon: Burnell Blanks, MD;  Location: [place] [facility];  Service: Cardiovascular;  Laterality: N/A;  . TONSILLECTOMY    . TUBAL LIGATION     Family History  Problem Relation Age of Onset  . Hypertension Father   . Coronary artery disease Sister   . Heart attack Brother     Deceased  . Colon cancer Neg Hx   . Liver disease Neg Hx    Social History:  reports that she has never smoked. She has never used smokeless tobacco. She reports that she does not drink alcohol or  use drugs. Allergies: No Known Allergies Medications Prior to Admission  Medication Sig Dispense Refill  . amLODipine-valsartan (EXFORGE) 10-320 MG tablet Take 1 tablet by mouth daily. 90 tablet 3  . aspirin 81  MG tablet Take 81 mg by mouth daily.      . cloNIDine (CATAPRES - DOSED IN MG/24 HR) 0.3 mg/24hr patch APPLY 1 PATCH ONCE WEEKLY. 12 patch 3  . eplerenone (INSPRA) 50 MG tablet Take 50 mg by mouth daily.    Marland Kitchen glipiZIDE (GLIPIZIDE XL) 5 MG 24 hr tablet Take 1 tablet (5 mg total) by mouth daily with breakfast. Do not take this medication if your blood sugar is less than 110.    . Linagliptin-Metformin HCl (JENTADUETO) 2.5-500 MG TABS Take 1 tablet by mouth 2 (two) times daily. Do not take this medication if your blood sugar is less than 110.    . simvastatin (ZOCOR) 20 MG tablet TAKE (1) TABLET BY MOUTH ONCE DAILY AT BEDTIME FOR CHOLESTEROL. 90 tablet 3    Home: Home Living Family/patient expects to be discharged to:: Private residence Living Arrangements: Children Available Help at Discharge: Family, Available 24 hours/day (72yo granddaughter and son who works 3rd shift) Type of Home: House Home Access: Stairs to enter Technical brewer of Steps: 3 Entrance Stairs-Rails: Can reach both [place]: One level Biochemist, clinical: [place]: None Additional Comments: sponge bathes  Functional History: Prior Function Level of Independence: Independent Comments: was amb without AD,sponge bathes Functional Status:  Mobility: Bed Mobility Overal bed mobility: Needs Assistance Bed Mobility: Sit to Supine Supine to sit: Max assist, +2 for physical assistance, +2 for safety/equipment, HOB elevated Sit to supine: +2 for physical assistance, Max assist General bed mobility comments: assist needed with trunk and LEs, pt up in chair upon arrival  Transfers Overall transfer level: Needs assistance Equipment used: 2 person hand held assist Transfers: Sit to/from Stand Sit to Stand: +2 physical assistance, Mod assist Stand pivot transfers: Mod assist, Max assist, +2 physical assistance, +2 safety/equipment General transfer comment: cues for sternal  precautions Ambulation/Gait Ambulation/Gait assistance: Min assist, +2 safety/equipment (assist with lines and chair follow) Ambulation Distance (Feet): 80 Feet Assistive device:  (eva walker) Gait Pattern/deviations: Step-through pattern, Decreased step length - right, Decreased step length - left General Gait Details: assist provided at walker for stability, on 2L O2 with sats in 90s throughout.  Gait velocity: decreased    ADL:    Cognition: Cognition Overall Cognitive Status: Within Functional Limits for tasks assessed Orientation Level: Oriented X4 Cognition Arousal/Alertness: Awake/alert Behavior During Therapy: WFL for tasks assessed/performed Overall Cognitive Status: Within Functional Limits for tasks assessed  Blood pressure (!) 149/53, pulse 69, temperature 99.4 F (37.4 C), temperature source Oral, resp. rate (!) 36, height _0  (1.549 m), weight 83.6 kg (184 lb 4.9 oz), SpO2 99 %. Physical Exam  Vitals reviewed. Constitutional: She is oriented to person, place, and time. She appears well-developed. No distress.  Obese Makes poor eye contact  HENT:  Head: Normocephalic and atraumatic.  Edentulous  Eyes: Right eye exhibits no discharge. Left eye exhibits no discharge.  Neck: Normal range of motion. Neck supple. No thyromegaly present.  Cardiovascular: Normal rate and regular rhythm.   Respiratory: Effort normal.  Clear to auscultation with limited inspiratory effort +[place]  GI: Soft. Bowel sounds are normal. She exhibits no distension.  Musculoskeletal: She exhibits edema. She exhibits no tenderness.  Neurological: She is alert and oriented to person, place, and time.  She provides  her name and age follows simple commands. Motor: 4+/5 throuhgout  Skin: Skin is warm and dry. She is not diaphoretic.  Midline chest incision and neck clean and dry  Psychiatric: She has a normal mood and affect. Her speech is normal.  Pt states states I look like a kidney doctor  because I am foreign    Results for orders placed or performed during the hospital encounter of 02/16/17 (from the past 24 hour(s))  Glucose, capillary     Status: None   Collection Time: 02/21/17  7:25 AM  Result Value Ref Range   Glucose-Capillary 93 65 - 99 mg/dL   Comment 1 Capillary Specimen    Comment 2 Notify RN   Glucose, capillary     Status: Abnormal   Collection Time: 02/21/17 11:28 AM  Result Value Ref Range   Glucose-Capillary 171 (H) 65 - 99 mg/dL   Comment 1 Notify RN   Glucose, capillary     Status: Abnormal   Collection Time: 02/21/17  4:25 PM  Result Value Ref Range   Glucose-Capillary 119 (H) 65 - 99 mg/dL   Comment 1 Capillary Specimen    Comment 2 Notify RN    Comment 3 Document in Chart   Glucose, capillary     Status: Abnormal   Collection Time: 02/21/17  7:41 PM  Result Value Ref Range   Glucose-Capillary 208 (H) 65 - 99 mg/dL   Comment 1 Capillary Specimen    Comment 2 Notify RN    Comment 3 Document in Chart   Glucose, capillary     Status: None   Collection Time: 02/22/17 12:03 AM  Result Value Ref Range   Glucose-Capillary 75 65 - 99 mg/dL   Comment 1 Capillary Specimen    Comment 2 Notify RN    Comment 3 Document in Chart   Glucose, capillary     Status: Abnormal   Collection Time: 02/22/17  3:45 AM  Result Value Ref Range   Glucose-Capillary 108 (H) 65 - 99 mg/dL   Comment 1 Capillary Specimen    Comment 2 Notify RN    Comment 3 Document in Chart   Cooxemetry Panel (carboxy, met, total hgb, O2 sat)     Status: Abnormal   Collection Time: 02/22/17  4:04 AM  Result Value Ref Range   Total hemoglobin 7.4 (L) 12.0 - 16.0 g/dL   O2 Saturation 61.8 %   Carboxyhemoglobin 1.0 0.5 - 1.5 %   Methemoglobin 1.3 0.0 - 1.5 %  Basic metabolic panel     Status: Abnormal   Collection Time: 02/22/17  4:23 AM  Result Value Ref Range   Sodium 144 135 - 145 mmol/L   Potassium 4.0 3.5 - 5.1 mmol/L   Chloride 106 101 - 111 mmol/L   CO2 28 22 - 32 mmol/L    Glucose, Bld 115 (H) 65 - 99 mg/dL   BUN 21 (H) 6 - 20 mg/dL   Creatinine, Ser 1.46 (H) 0.44 - 1.00 mg/dL   Calcium 8.5 (L) 8.9 - 10.3 mg/dL   GFR calc non Af Amer 33 (L) >60 mL/min   GFR calc Af Amer 39 (L) >60 mL/min   Anion gap 10 5 - 15  CBC     Status: Abnormal   Collection Time: 02/22/17  4:23 AM  Result Value Ref Range   WBC 11.2 (H) 4.0 - 10.5 K/uL   RBC 3.28 (L) 3.87 - 5.11 MIL/uL   Hemoglobin 8.2 (L) 12.0 - 15.0 g/dL  HCT 25.3 (L) 36.0 - 46.0 %   MCV 77.1 (L) 78.0 - 100.0 fL   MCH 25.0 (L) 26.0 - 34.0 pg   MCHC 32.4 30.0 - 36.0 g/dL   RDW 17.4 (H) 11.5 - 15.5 %   Platelets 167 150 - 400 K/uL   Dg Chest Port 1 View  Result Date: 02/21/2017 CLINICAL DATA:  Pneumothorax. EXAM: PORTABLE CHEST 1 VIEW COMPARISON:  02/20/2017. FINDINGS: Right IJ sheath in stable position. Right PICC line noted with tip projected over right atrium. Prior CABG. Cardiomegaly with bilateral from interstitial prominence and bilateral pleural effusions. Findings consistent CHF. No pneumothorax . IMPRESSION: 1. Right IJ sheath noted in stable position. Interim placement of right PICC line, its tip is over the right atrium. 2. Prior CABG. Cardiomegaly with slight progression of bilateral pulmonary interstitial prominence and bilateral pleural effusions consistent with progressive CHF . Electronically Signed   By: Marcello Moores  Register   On: 02/21/2017 07:41    Assessment/Plan: Diagnosis: Debilitation  Labs and images independently reviewed.  Records reviewed and summated above.  1. Does the need for close, 24 hr/day medical supervision in concert with the patient's rehab needs make it unreasonable for this patient to be served in a less intensive setting? Yes 2. Co-Morbidities requiring supervision/potential complications: sinus bradycardia (monitor HR with increased physical activity), AKI on CKD stage III (avoid nephrotoxic meds), HTN (monitor and provide prns in accordance with increased physical exertion  and pain), hyperlipidemia (cont meds), diastolic dysfunction (monitor for signs/symptoms of fluid overload), acute blood loss anemia (transfuse if necessary to ensure appropriate perfusion for increased activity tolerance), leukocytosis (cont to monitor for signs and symptoms of infection, further workup if indicated), tachypnea (monitor RR and O2 Sats with increased physical exertion) 3. Due to safety, skin/wound care, disease management, pain management and patient education, does the patient require 24 hr/day rehab nursing? Yes 4. Does the patient require coordinated care of a physician, rehab nurse, PT (1-2 hrs/day, 5 days/week) and OT (1-2 hrs/day, 5 days/week) to address physical and functional deficits in the context of the above medical diagnosis(es)? Yes Addressing deficits in the following areas: balance, endurance, locomotion, strength, transferring, bathing, dressing, toileting and psychosocial support 5. Can the patient actively participate in an intensive therapy program of at least 3 hrs of therapy per day at least 5 days per week? Potentially 6. The potential for patient to make measurable gains while on inpatient rehab is excellent 7. Anticipated functional outcomes upon discharge from inpatient rehab are modified independent and supervision  with PT, modified independent and supervision with OT, n/a with SLP. 8. Estimated rehab length of stay to reach the above functional goals is: 13-17 days. 9. Does the patient have adequate social supports and living environment to accommodate these discharge functional goals? Potentially 10. Anticipated D/C setting: Home 11. Anticipated post D/C treatments: HH therapy and Home excercise program 12. Overall Rehab/Functional Prognosis: good  RECOMMENDATIONS: This patient's condition is appropriate for continued rehabilitative care in the following setting: CIR if caregiver support available Patient has agreed to participate in recommended program.  Yes Note that insurance prior authorization may be required for reimbursement for recommended care.  Comment: Rehab Admissions Coordinator to follow up.  Delice Lesch, MD, Mellody Drown Cathlyn Parsons., PA-C 02/22/2017

## 2017-02-22 NOTE — Plan of Care (Signed)
Problem: Activity: Goal: Risk for activity intolerance will decrease Outcome: Progressing Pt walked x3  Problem: Bowel/Gastric: Goal: Gastrointestinal status for postoperative course will improve Outcome: Progressing Multiple BM  Problem: Cardiac: Goal: Hemodynamic stability will improve Outcome: Progressing On no gtt's. Within parameters

## 2017-02-22 NOTE — Progress Notes (Signed)
Patient ID: Stephanie Hendricks, female   DOB: 1941-09-06, 76 y.o.   MRN: [id number] [facility] DAILY ICU PROGRESS NOTE                   [address]            [place] [address]          [phone]   6 Days Post-Op Procedure(s) (LRB): CORONARY ARTERY BYPASS GRAFTING (CABG) x 3 (LIMA to LAD, SVG SEQUENTIALLY to RAMUS INTERMEDIATE and DISTAL CIRCUMFLEX) WITH ENDOSCOPIC HARVESTING OF RIGHT SAPHENOUS VEIN (N/A) TRANSESOPHAGEAL ECHOCARDIOGRAM W/O CARDIOVERSION  Total Length of Stay:  LOS: 6 days   Subjective: Up to chair, walked 200 feet with effort   Objective: Vital signs in last 24 hours: Temp:  [97.9 F (36.6 C)-99.4 F (37.4 C)] 98 F (36.7 C) (03/02 0713) Pulse Rate:  [55-80] 67 (03/02 0700) Cardiac Rhythm: Normal sinus rhythm (03/02 0400) Resp:  [11-36] 20 (03/02 0700) BP: (117-165)/(47-88) 127/52 (03/02 0700) SpO2:  [89 %-100 %] 100 % (03/02 0713) Weight:  [184 lb 4.9 oz (83.6 kg)] 184 lb 4.9 oz (83.6 kg) (03/02 0304)  Filed Weights   02/20/17 0500 02/21/17 0500 02/22/17 0304  Weight: 190 lb 4.1 oz (86.3 kg) 186 lb 1.1 oz (84.4 kg) 184 lb 4.9 oz (83.6 kg)    Weight change: -1 lb 12.2 oz (-0.8 kg)   Hemodynamic parameters for last 24 hours:    Intake/Output from previous day: 03/01 0701 - 03/02 0700 In: 280.1 [P.O.:200; I.V.:80.1] Out: 1090 [Urine:1090]  Intake/Output this shift: No intake/output data recorded.  Current Meds: Scheduled Meds: . amiodarone  200 mg Oral BID  . amLODipine  10 mg Oral Daily  . aspirin EC  81 mg Oral Daily   Or  . aspirin  81 mg Per Tube Daily  . atorvastatin  40 mg Oral q1800  . bisacodyl  10 mg Oral Daily   Or  . bisacodyl  10 mg Rectal Daily  . chlorhexidine gluconate (MEDLINE KIT)  15 mL Mouth Rinse BID  . Chlorhexidine Gluconate Cloth  6 each Topical Daily  . docusate sodium  200 mg Oral Daily  . ferrous sulfate  325 mg Oral Q breakfast  . folic acid  1 mg Oral Daily  . furosemide  40 mg Intravenous BID  .  insulin aspart  0-24 Units Subcutaneous Q4H  . levalbuterol  0.63 mg Nebulization TID  . mouth rinse  15 mL Mouth Rinse BID  . metoprolol tartrate  25 mg Oral BID   Or  . metoprolol tartrate  25 mg Per Tube BID  . pantoprazole  40 mg Oral Daily  . potassium chloride  20 mEq Oral BID  . sodium chloride flush  10-40 mL Intracatheter Q12H  . sodium chloride flush  3 mL Intravenous Q12H   Continuous Infusions: . sodium chloride Stopped (02/19/17 0938)  . lactated ringers Stopped (02/21/17 1000)  . nitroGLYCERIN Stopped (02/20/17 1533)  . phenylephrine (NEO-SYNEPHRINE) Adult infusion Stopped (02/19/17 0720)   PRN Meds:.sodium chloride, metoprolol, morphine injection, ondansetron (ZOFRAN) IV, oxyCODONE, sodium chloride flush, sodium chloride flush, sodium chloride flush, traMADol  General appearance: alert and cooperative Neurologic: intact Heart: regular rate and rhythm, S1, S2 normal, no murmur, click, rub or gallop Lungs: diminished breath sounds bibasilar Abdomen: soft, non-tender; bowel sounds normal; no masses,  no organomegaly Extremities: extremities normal, atraumatic, no cyanosis or edema and Homans sign is negative, no sign of DVT Wound: sternum stable  Lab Results: CBC: Recent Labs  02/21/17 0500 02/22/17 0423  WBC 9.7 11.2*  HGB 7.8* 8.2*  HCT 23.8* 25.3*  PLT 118* 167   BMET:  Recent Labs  02/21/17 0500 02/22/17 0423  NA 145 144  K 3.4* 4.0  CL 110 106  CO2 29 28  GLUCOSE 103* 115*  BUN 19 21*  CREATININE 1.44* 1.46*  CALCIUM 8.1* 8.5*    CMET: Lab Results  Component Value Date   WBC 11.2 (H) 02/22/2017   HGB 8.2 (L) 02/22/2017   HCT 25.3 (L) 02/22/2017   PLT 167 02/22/2017   GLUCOSE 115 (H) 02/22/2017   CHOL 70 02/16/2017   TRIG 62 02/16/2017   HDL 22 (L) 02/16/2017   LDLCALC 36 02/16/2017   ALT 11 11/06/2014   AST 13 11/06/2014   NA 144 02/22/2017   K 4.0 02/22/2017   CL 106 02/22/2017   CREATININE 1.46 (H) 02/22/2017   BUN 21 (H)  02/22/2017   CO2 28 02/22/2017   TSH 0.785 02/16/2017   INR 1.52 02/16/2017   HGBA1C 6.0 (H) 02/21/2017      PT/INR: No results for input(s): LABPROT, INR in the last 72 hours. Radiology: No results found.   Assessment/Plan: S/P Procedure(s) (LRB): CORONARY ARTERY BYPASS GRAFTING (CABG) x 3 (LIMA to LAD, SVG SEQUENTIALLY to RAMUS INTERMEDIATE and DISTAL CIRCUMFLEX) WITH ENDOSCOPIC HARVESTING OF RIGHT SAPHENOUS VEIN (N/A) TRANSESOPHAGEAL ECHOCARDIOGRAM W/O CARDIOVERSION Mobilize Diuresis Diabetes control Plan for transfer to step-down: see transfer orders  Renal function stable Has support at home , but would benefit from ip rehab and then home   Stephanie Hendricks 02/22/2017 7:37 AM

## 2017-02-22 NOTE — Progress Notes (Addendum)
I met with pt as she ambulates in hallway with Nursing with EVA walker. Pt prefers an inpt rehab admission if insurance will approve. I will initiate [place] insurance approval once OT completes their eval, and follow up on Monday. Rehab venue will depend on insurance approval and bed availability when pt medically ready to d/c. [phone]

## 2017-02-22 NOTE — Evaluation (Signed)
Occupational Therapy Evaluation Patient Details Name: Stephanie Hendricks MRN: [id number] DOB: Jan 08, 1941 Today's Date: 02/22/2017    History of Present Illness Stephanie Hendricks is a very pleasant 76 yo woman with past medical history of sinus bradycardia, CKD, DM, HTN, hyperlipidemia who is coming as a transfer from [facility] for high risk NSTEMI. 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    Clinical Impression   Pt admitted with above. She demonstrates the below listed deficits and will benefit from continued OT to maximize safety and independence with BADLs.  Pt presents to OT with impaired balance, generalized weakness, decreased activity tolerance, decreased knowledge of precautions.  She currently requires mod - max A, overall for ADLs and min - mod A +2 for functional transfers.  Feel she would benefit from CIR      Follow Up Recommendations  CIR;Supervision/Assistance - 24 hour    Equipment Recommendations  3 in 1 bedside commode    Recommendations for Other Services       Precautions / Restrictions Precautions Precautions: Fall;Sternal Precaution Comments: Pt able to recall 3/3 sternal precautions with mod cues  Restrictions Other Position/Activity Restrictions: bilat UE no push/pulling due to sternal precautions      Mobility Bed Mobility Overal bed mobility: Needs Assistance Bed Mobility: Sit to Supine       Sit to supine: Max assist   General bed mobility comments: verbal cues for technique and assist to lower trunk and to lift LEs   Transfers Overall transfer level: Needs assistance Equipment used: 2 person hand held assist (EVA walker ) Transfers: Sit to/from [facility] Sit to Stand: Mod assist;+2 physical assistance Stand pivot transfers: Min assist;+2 safety/equipment       General transfer comment: Pt requires cues for technique and precautions and assist to move into standing.      Balance Overall balance assessment: Needs  assistance Sitting-balance support: Feet supported Sitting balance-Leahy Scale: Fair     Standing balance support: Bilateral upper extremity supported Standing balance-Leahy Scale: Poor Standing balance comment: requires UE support and min A                             ADL Overall ADL's : Needs assistance/impaired Eating/Feeding: Modified independent;Sitting   Grooming: Wash/dry hands;Wash/dry face;Oral care;Brushing hair;Set up;Sitting   Upper Body Bathing: Moderate assistance;Sitting   Lower Body Bathing: Maximal assistance;Sit to/from stand   Upper Body Dressing : Maximal assistance;Sitting   Lower Body Dressing: Total assistance;Sit to/from stand   Toilet Transfer: +2 for safety/equipment;BSC;Moderate assistance Toilet Transfer Details (indicate cue type and reason): Pt requires mod A +2 fot sit to stand  Toileting- Clothing Manipulation and Hygiene: Total assistance;Sit to/from stand       Functional mobility during ADLs: Moderate assistance;+2 for physical assistance;Rolling walker       Vision         Perception     Praxis      Pertinent Vitals/Pain Pain Assessment: Faces Faces Pain Scale: Hurts a little bit Pain Location: incisional Pain Descriptors / Indicators: Sore Pain Intervention(s): Monitored during session     Hand Dominance Right   Extremity/Trunk Assessment Upper Extremity Assessment Upper Extremity Assessment: Generalized weakness   Lower Extremity Assessment Lower Extremity Assessment: Defer to PT evaluation   Cervical / Trunk Assessment Cervical / Trunk Assessment: Kyphotic   Communication Communication Communication: No difficulties   Cognition Arousal/Alertness: Awake/alert Behavior During Therapy: WFL for tasks assessed/performed Overall Cognitive Status:  Within Functional Limits for tasks assessed                     General Comments  VSS    Exercises       Shoulder Instructions      Home Living  Family/patient expects to be discharged to:: Private residence Living Arrangements: Children Available Help at Discharge: Family;Available 24 hours/day Type of Home: House Home Access: Stairs to enter [employer] of Steps: 3 Entrance Stairs-Rails: Can reach both Home Layout: One level     Bathroom Shower/Tub: Tub only   Biochemist, clinical: Standard     Home Equipment: None   Additional Comments: Pt lives with son and grandson.  Pt sponge bathes      Prior Functioning/Environment Level of Independence: Independent        Comments: was amb without AD,sponge bathes        OT Problem List: Decreased strength;Decreased activity tolerance;Impaired balance (sitting and/or standing);Decreased safety awareness;Decreased knowledge of use of DME or AE;Decreased knowledge of precautions;Cardiopulmonary status limiting activity;Obesity;Pain      OT Treatment/Interventions: Self-care/ADL training;Energy conservation;DME and/or AE instruction;Therapeutic activities;Patient/family education;Balance training    OT Goals(Current goals can be found in the care plan section) Acute Rehab OT Goals Patient Stated Goal: to get better  OT Goal Formulation: With patient Time For Goal Achievement: 03/08/17 Potential to Achieve Goals: Good ADL Goals Pt Will Perform Grooming: with supervision;standing Pt Will Perform Upper Body Bathing: with set-up;with supervision;sitting Pt Will Perform Lower Body Bathing: with min guard assist;sit to/from stand Pt Will Perform Upper Body Dressing: with supervision;with set-up;sitting Pt Will Perform Lower Body Dressing: with min guard assist;sit to/from stand;with adaptive equipment Pt Will Transfer to Toilet: with min guard assist;ambulating;regular height toilet;bedside commode;grab bars Pt Will Perform Toileting - Clothing Manipulation and hygiene: with min guard assist;sit to/from stand  OT Frequency: Min 2X/week   Barriers to D/C:             Co-evaluation              End of Session Equipment Utilized During Treatment: Gait belt;Oxygen;Other (comment) (EVA walker ) Nurse Communication: Mobility status  Activity Tolerance: Patient tolerated treatment well Patient left: in bed;with call bell/phone within reach  OT Visit Diagnosis: Unsteadiness on feet (R26.81)                ADL either performed or assessed with clinical judgement  Time: [id number] OT Time Calculation (min): 36 min Charges:  OT General Charges $OT Visit: 1 Procedure OT Evaluation $OT Eval Moderate Complexity: 1 Procedure OT Treatments $Self Care/Home Management : 8-22 mins G-Codes:     [facility], OTR/L [id number]   Norleen Xie M 02/22/2017, 2:57 PM

## 2017-02-23 ENCOUNTER — Inpatient Hospital Stay (HOSPITAL_COMMUNITY): Payer: Medicare Other

## 2017-02-23 LAB — BASIC METABOLIC PANEL
Anion gap: 8 (ref 5–15)
BUN: 35 mg/dL — ABNORMAL HIGH (ref 6–20)
CO2: 29 mmol/L (ref 22–32)
Calcium: 8.7 mg/dL — ABNORMAL LOW (ref 8.9–10.3)
Chloride: 106 mmol/L (ref 101–111)
Creatinine, Ser: 1.77 mg/dL — ABNORMAL HIGH (ref 0.44–1.00)
GFR calc Af Amer: 31 mL/min — ABNORMAL LOW (ref >60)
GFR calc non Af Amer: 26 mL/min — ABNORMAL LOW (ref >60)
Glucose, Bld: 136 mg/dL — ABNORMAL HIGH (ref 65–99)
Potassium: 4.4 mmol/L (ref 3.5–5.1)
Sodium: 143 mmol/L (ref 135–145)

## 2017-02-23 LAB — CBC
HCT: 24.9 % — ABNORMAL LOW (ref 36.0–46.0)
Hemoglobin: 7.9 g/dL — ABNORMAL LOW (ref 12.0–15.0)
MCH: 24.8 pg — ABNORMAL LOW (ref 26.0–34.0)
MCHC: 31.7 g/dL (ref 30.0–36.0)
MCV: 78.3 fL (ref 78.0–100.0)
Platelets: 222 10*3/uL (ref 150–400)
RBC: 3.18 MIL/uL — ABNORMAL LOW (ref 3.87–5.11)
RDW: 17.7 % — ABNORMAL HIGH (ref 11.5–15.5)
WBC: 14.5 10*3/uL — ABNORMAL HIGH (ref 4.0–10.5)

## 2017-02-23 LAB — GLUCOSE, CAPILLARY
GLUCOSE-CAPILLARY: 200 mg/dL — AB (ref 65–99)
GLUCOSE-CAPILLARY: 163 mg/dL — AB (ref 65–99)
GLUCOSE-CAPILLARY: 143 mg/dL — AB (ref 65–99)
Glucose-Capillary: 147 mg/dL — ABNORMAL HIGH (ref 65–99)
Glucose-Capillary: 168 mg/dL — ABNORMAL HIGH (ref 65–99)

## 2017-02-23 MED ORDER — GLIPIZIDE ER 5 MG PO TB24
5.0000 mg | ORAL_TABLET | Freq: Every day | ORAL | Status: DC
  Administered 2017-02-24 – 2017-03-02 (×7): 5 mg via ORAL
  Filled 2017-02-23 (×7): qty 1

## 2017-02-23 MED ORDER — FE FUMARATE-B12-VIT C-FA-IFC PO CAPS
1.0000 | ORAL_CAPSULE | Freq: Two times a day (BID) | ORAL | Status: DC
  Administered 2017-02-23 – 2017-03-02 (×13): 1 via ORAL
  Filled 2017-02-23 (×13): qty 1

## 2017-02-23 NOTE — Progress Notes (Signed)
CARDIAC REHAB PHASE I   PRE:  Rate/Rhythm: 58 SB  BP:  Sitting: 138/60       SaO2: 87 2L, 89 3L    Pt stood up with my assist and using RW and took two steps and said she felt shaky and wanted to sit back down.  Pt unable and unwilling to walk at this time.  Returned to bed with call bell in reach.  Encouraged pt to try to walk later in the day. 12:50-1:38  Stephanie Hatchet, MS ACSM RCEP 02/23/2017 1:35 PM

## 2017-02-23 NOTE — Progress Notes (Addendum)
[address]       [place] [address]             [phone]      7 Days Post-Op Procedure(s) (LRB): CORONARY ARTERY BYPASS GRAFTING (CABG) x 3 (LIMA to LAD, SVG SEQUENTIALLY to RAMUS INTERMEDIATE and DISTAL CIRCUMFLEX) WITH ENDOSCOPIC HARVESTING OF RIGHT SAPHENOUS VEIN (N/A) TRANSESOPHAGEAL ECHOCARDIOGRAM W/O CARDIOVERSION Subjective: Feels fair, poorly motivated to do much activity  Objective: Vital signs in last 24 hours: Temp:  [98.3 F (36.8 C)-98.8 F (37.1 C)] 98.6 F (37 C) (03/03 0417) Pulse Rate:  [64-82] 70 (03/03 0917) Cardiac Rhythm: Normal sinus rhythm (03/03 0700) Resp:  [18-25] 20 (03/03 0417) BP: (131-155)/(51-119) 147/51 (03/03 0917) SpO2:  [88 %-95 %] 94 % (03/03 0417) Weight:  [184 lb 1.6 oz (83.5 kg)] 184 lb 1.6 oz (83.5 kg) (03/03 0417)  Hemodynamic parameters for last 24 hours:    Intake/Output from previous day: 03/02 0701 - 03/03 0700 In: 520 [P.O.:480; I.V.:40] Out: 400 [Urine:400] Intake/Output this shift: No intake/output data recorded.  General appearance: alert, cooperative and no distress Heart: regular rate and rhythm and soft systolic murmur Lungs: tubular right lower fields. dim left base Abdomen: benign Extremities: min edema Wound: incis healing well  Lab Results:  Recent Labs  02/22/17 0423 02/23/17 0423  WBC 11.2* 14.5*  HGB 8.2* 7.9*  HCT 25.3* 24.9*  PLT 167 222   BMET:  Recent Labs  02/22/17 0423 02/23/17 0423  NA 144 143  K 4.0 4.4  CL 106 106  CO2 28 29  GLUCOSE 115* 136*  BUN 21* 35*  CREATININE 1.46* 1.77*  CALCIUM 8.5* 8.7*    PT/INR: No results for input(s): LABPROT, INR in the last 72 hours. ABG    Component Value Date/Time   PHART 7.329 (L) 02/19/2017 1036   HCO3 24.5 02/19/2017 1036   TCO2 26 02/19/2017 1036   ACIDBASEDEF 1.0 02/19/2017 1036   O2SAT 61.8 02/22/2017 0404   CBG (last 3)   Recent Labs  02/22/17 2124 02/23/17 0607 02/23/17 0832  GLUCAP 169* 147*  168*    Meds Scheduled Meds: . amiodarone  200 mg Oral BID  . amLODipine  10 mg Oral Daily  . aspirin EC  81 mg Oral Daily  . atorvastatin  40 mg Oral q1800  . Chlorhexidine Gluconate Cloth  6 each Topical Daily  . enoxaparin (LOVENOX) injection  30 mg Subcutaneous Q24H  . ferrous sulfate  325 mg Oral Q breakfast  . folic acid  1 mg Oral Daily  . furosemide  40 mg Oral Daily  . insulin aspart  0-24 Units Subcutaneous TID AC & HS  . mouth rinse  15 mL Mouth Rinse BID  . metoprolol tartrate  25 mg Oral BID  . pantoprazole  40 mg Oral QAC breakfast  . potassium chloride  20 mEq Oral BID  . sodium chloride flush  10-40 mL Intracatheter Q12H  . sodium chloride flush  3 mL Intravenous Q12H   Continuous Infusions: PRN Meds:.sodium chloride, acetaminophen, guaiFENesin, levalbuterol, ondansetron **OR** ondansetron (ZOFRAN) IV, sodium chloride flush, sodium chloride flush, sodium chloride flush, traMADol  Xrays Dg Chest 2 View  Result Date: 02/23/2017 CLINICAL DATA:  Status post coronary artery bypass graft. EXAM: CHEST  2 VIEW COMPARISON:  Radiograph February 21, 2017. FINDINGS: Stable cardiomediastinal silhouette. Sternotomy wires are noted. No pneumothorax is noted. Right-sided PICC line is unchanged in position. Stable bilateral basilar opacities are noted concerning for  atelectasis or edema with associated pleural effusions, right greater than left. Bony thorax is unremarkable. IMPRESSION: Stable bibasilar opacities as described above. Electronically Signed   By: Marijo Conception, M.D.   On: 02/23/2017 09:07    Assessment/Plan: S/P Procedure(s) (LRB): CORONARY ARTERY BYPASS GRAFTING (CABG) x 3 (LIMA to LAD, SVG SEQUENTIALLY to RAMUS INTERMEDIATE and DISTAL CIRCUMFLEX) WITH ENDOSCOPIC HARVESTING OF RIGHT SAPHENOUS VEIN (N/A) TRANSESOPHAGEAL ECHOCARDIOGRAM W/O CARDIOVERSION  1 stable with slow progress, encouraged her to be an active participant in her recovery but she doesn't want to walk  today 2 sinus with pvc's , + HTN 3 increased leukocytosis without fever, push pulm toilet, check urine 4 cont gentle diuretic with effusions and wt still up- will have to be carefull as creat is rising 5 not a candidate currently for ACE-I/ARB 6 restart glipizide and wait to restart jentadueto for now 7 H/H borderline- if gets much lower, may benefit from transfusion  LOS: 7 days    GOLD,WAYNE E 02/23/2017  Poor lung volumes on x-ray Poor mobility with reactive rehabilitation Rising BUN creatinine-continue low-dose oral Lasix for fluid retention Oral iron and folic acid 4 expected postop blood loss anemia patient examined and medical record reviewed,agree with above note. Tharon Aquas Trigt III 02/23/2017

## 2017-02-24 LAB — CBC
HCT: 25.1 % — ABNORMAL LOW (ref 36.0–46.0)
HEMOGLOBIN: 7.9 g/dL — AB (ref 12.0–15.0)
MCH: 24.5 pg — AB (ref 26.0–34.0)
MCHC: 31.5 g/dL (ref 30.0–36.0)
MCV: 78 fL (ref 78.0–100.0)
Platelets: 317 10*3/uL (ref 150–400)
RBC: 3.22 MIL/uL — AB (ref 3.87–5.11)
RDW: 17.9 % — ABNORMAL HIGH (ref 11.5–15.5)
WBC: 17.1 10*3/uL — AB (ref 4.0–10.5)

## 2017-02-24 LAB — URINALYSIS, ROUTINE W REFLEX MICROSCOPIC
BACTERIA UA: NONE SEEN
Bilirubin Urine: NEGATIVE
Glucose, UA: NEGATIVE mg/dL
Ketones, ur: NEGATIVE mg/dL
Leukocytes, UA: NEGATIVE
NITRITE: NEGATIVE
Protein, ur: 30 mg/dL — AB
RBC / HPF: NONE SEEN RBC/hpf (ref 0–5)
SPECIFIC GRAVITY, URINE: 1.013 (ref 1.005–1.030)
pH: 5 (ref 5.0–8.0)

## 2017-02-24 LAB — PREPARE RBC (CROSSMATCH)

## 2017-02-24 LAB — BASIC METABOLIC PANEL
Anion gap: 7 (ref 5–15)
BUN: 45 mg/dL — ABNORMAL HIGH (ref 6–20)
CO2: 29 mmol/L (ref 22–32)
CREATININE: 1.87 mg/dL — AB (ref 0.44–1.00)
Calcium: 9 mg/dL (ref 8.9–10.3)
Chloride: 107 mmol/L (ref 101–111)
GFR calc Af Amer: 29 mL/min — ABNORMAL LOW (ref >60)
GFR, EST NON AFRICAN AMERICAN: 25 mL/min — AB (ref >60)
GLUCOSE: 142 mg/dL — AB (ref 65–99)
Potassium: 5 mmol/L (ref 3.5–5.1)
SODIUM: 143 mmol/L (ref 135–145)

## 2017-02-24 LAB — GLUCOSE, CAPILLARY
GLUCOSE-CAPILLARY: 165 mg/dL — AB (ref 65–99)
Glucose-Capillary: 125 mg/dL — ABNORMAL HIGH (ref 65–99)
Glucose-Capillary: 194 mg/dL — ABNORMAL HIGH (ref 65–99)
Glucose-Capillary: 191 mg/dL — ABNORMAL HIGH (ref 65–99)

## 2017-02-24 MED ORDER — AMIODARONE HCL 200 MG PO TABS
200.0000 mg | ORAL_TABLET | Freq: Every day | ORAL | Status: DC
  Administered 2017-02-25 – 2017-03-02 (×6): 200 mg via ORAL
  Filled 2017-02-24 (×6): qty 1

## 2017-02-24 MED ORDER — ALTEPLASE 2 MG IJ SOLR
2.0000 mg | Freq: Once | INTRAMUSCULAR | Status: AC
  Administered 2017-02-24: 2 mg
  Filled 2017-02-24: qty 2

## 2017-02-24 MED ORDER — FUROSEMIDE 10 MG/ML IJ SOLN
40.0000 mg | Freq: Once | INTRAMUSCULAR | Status: AC
  Administered 2017-02-24: 40 mg via INTRAVENOUS
  Filled 2017-02-24: qty 4

## 2017-02-24 NOTE — Progress Notes (Addendum)
[address]       [place] [address]             [phone]      8 Days Post-Op Procedure(s) (LRB): CORONARY ARTERY BYPASS GRAFTING (CABG) x 3 (LIMA to LAD, SVG SEQUENTIALLY to RAMUS INTERMEDIATE and DISTAL CIRCUMFLEX) WITH ENDOSCOPIC HARVESTING OF RIGHT SAPHENOUS VEIN (N/A) TRANSESOPHAGEAL ECHOCARDIOGRAM W/O CARDIOVERSION Subjective:  conts to slowly feel better but not doing much in regards to rehab.  Objective: Vital signs in last 24 hours: Temp:  [97.8 F (36.6 C)-98.7 F (37.1 C)] 98.3 F (36.8 C) (03/04 0416) Pulse Rate:  [59-62] 62 (03/04 0836) Cardiac Rhythm: Normal sinus rhythm (03/04 0729) Resp:  [18-20] 20 (03/04 0416) BP: (138-160)/(54-60) 160/55 (03/04 0836) SpO2:  [92 %-95 %] 92 % (03/04 0416) Weight:  [185 lb 1.6 oz (84 kg)] 185 lb 1.6 oz (84 kg) (03/04 0416)  Hemodynamic parameters for last 24 hours:    Intake/Output from previous day: 03/03 0701 - 03/04 0700 In: 20 [I.V.:20] Out: 325 [Urine:325] Intake/Output this shift: No intake/output data recorded.  General appearance: distracted and fatigued Heart: regular rate and rhythm and + systolic murmur Lungs: dim in lower fields Abdomen: soft, nontender Extremities: minor edema Wound: incis healing well  Lab Results:  Recent Labs  02/23/17 0423 02/24/17 0407  WBC 14.5* 17.1*  HGB 7.9* 7.9*  HCT 24.9* 25.1*  PLT 222 317   BMET:  Recent Labs  02/23/17 0423 02/24/17 0407  NA 143 143  K 4.4 5.0  CL 106 107  CO2 29 29  GLUCOSE 136* 142*  BUN 35* 45*  CREATININE 1.77* 1.87*  CALCIUM 8.7* 9.0    PT/INR: No results for input(s): LABPROT, INR in the last 72 hours. ABG    Component Value Date/Time   PHART 7.329 (L) 02/19/2017 1036   HCO3 24.5 02/19/2017 1036   TCO2 26 02/19/2017 1036   ACIDBASEDEF 1.0 02/19/2017 1036   O2SAT 61.8 02/22/2017 0404   CBG (last 3)   Recent Labs  02/23/17 1635 02/23/17 2052 02/24/17 0735  GLUCAP 163* 143* 125*     Meds Scheduled Meds: . amiodarone  200 mg Oral BID  . amLODipine  10 mg Oral Daily  . aspirin EC  81 mg Oral Daily  . atorvastatin  40 mg Oral q1800  . Chlorhexidine Gluconate Cloth  6 each Topical Daily  . enoxaparin (LOVENOX) injection  30 mg Subcutaneous Q24H  . ferrous [id number] C-folic acid  1 capsule Oral BID BM  . furosemide  40 mg Oral Daily  . glipiZIDE  5 mg Oral Q breakfast  . insulin aspart  0-24 Units Subcutaneous TID AC & HS  . mouth rinse  15 mL Mouth Rinse BID  . metoprolol tartrate  25 mg Oral BID  . pantoprazole  40 mg Oral QAC breakfast  . potassium chloride  20 mEq Oral BID  . sodium chloride flush  10-40 mL Intracatheter Q12H  . sodium chloride flush  3 mL Intravenous Q12H   Continuous Infusions: PRN Meds:.sodium chloride, acetaminophen, guaiFENesin, levalbuterol, ondansetron **OR** ondansetron (ZOFRAN) IV, sodium chloride flush, sodium chloride flush, sodium chloride flush, traMADol  Xrays Dg Chest 2 View  Result Date: 02/23/2017 CLINICAL DATA:  Status post coronary artery bypass graft. EXAM: CHEST  2 VIEW COMPARISON:  Radiograph February 21, 2017. FINDINGS: Stable cardiomediastinal silhouette. Sternotomy wires are noted. No pneumothorax is noted. Right-sided PICC line is unchanged in position. Stable bilateral basilar opacities are  noted concerning for atelectasis or edema with associated pleural effusions, right greater than left. Bony thorax is unremarkable. IMPRESSION: Stable bibasilar opacities as described above. Electronically Signed   By: Marijo Conception, M.D.   On: 02/23/2017 09:07    Assessment/Plan: S/P Procedure(s) (LRB): CORONARY ARTERY BYPASS GRAFTING (CABG) x 3 (LIMA to LAD, SVG SEQUENTIALLY to RAMUS INTERMEDIATE and DISTAL CIRCUMFLEX) WITH ENDOSCOPIC HARVESTING OF RIGHT SAPHENOUS VEIN (N/A) TRANSESOPHAGEAL ECHOCARDIOGRAM W/O CARDIOVERSION  1 progress remains slow- need to push rehab as able 2 leukocytosis increasing- UA to be  checked, no fevers- pulm atx vs infiltrates are concerning in someone so inactive- may need to consider abx 3 BUN/Creat conts to rise-I/O probably not accurate- will d/c lasix at this time 4 sinus brady- will d/c lopressor for now 5 sugars adeq controllled 7 H/H slightly improved - hold on transfusion 8 may need additional agent for HTN- follow for now as not a candidate for ACE/ARB or negative chronotropes currently  LOS: 8 days    Hendricks,Stephanie E 02/24/2017  low lung volumes HR 55/min Hb 7 g Refuses to mobilize with PT  Will a-pace, give 1 u PRBC and send for cxr PAL in am \\patient  examined and medical record reviewed,agree with above note. Tharon Aquas Trigt III 02/24/2017

## 2017-02-25 ENCOUNTER — Inpatient Hospital Stay (HOSPITAL_COMMUNITY): Payer: Medicare Other

## 2017-02-25 DIAGNOSIS — I5033 Acute on chronic diastolic (congestive) heart failure: Secondary | ICD-10-CM

## 2017-02-25 DIAGNOSIS — I5032 Chronic diastolic (congestive) heart failure: Secondary | ICD-10-CM

## 2017-02-25 LAB — BLOOD GAS, ARTERIAL
Acid-Base Excess: 3.9 mmol/L — ABNORMAL HIGH (ref 0.0–2.0)
Bicarbonate: 29.3 mmol/L — ABNORMAL HIGH (ref 20.0–28.0)
Drawn by: 44898
O2 Content: 3 L/min
O2 Saturation: 88.2 %
PATIENT TEMPERATURE: 98.6
PH ART: 7.335 — AB (ref 7.350–7.450)
PO2 ART: 59.3 mmHg — AB (ref 83.0–108.0)
pCO2 arterial: 56.5 mmHg — ABNORMAL HIGH (ref 32.0–48.0)

## 2017-02-25 LAB — CBC
HCT: 27.1 % — ABNORMAL LOW (ref 36.0–46.0)
Hemoglobin: 8.7 g/dL — ABNORMAL LOW (ref 12.0–15.0)
MCH: 25.6 pg — ABNORMAL LOW (ref 26.0–34.0)
MCHC: 32.1 g/dL (ref 30.0–36.0)
MCV: 79.7 fL (ref 78.0–100.0)
Platelets: 329 10*3/uL (ref 150–400)
RBC: 3.4 MIL/uL — ABNORMAL LOW (ref 3.87–5.11)
RDW: 18.2 % — ABNORMAL HIGH (ref 11.5–15.5)
WBC: 17.2 10*3/uL — ABNORMAL HIGH (ref 4.0–10.5)

## 2017-02-25 LAB — COOXEMETRY PANEL
CARBOXYHEMOGLOBIN: 1 % (ref 0.5–1.5)
Methemoglobin: 1 % (ref 0.0–1.5)
O2 SAT: 59.9 %
Total hemoglobin: 9.2 g/dL — ABNORMAL LOW (ref 12.0–16.0)

## 2017-02-25 LAB — GLUCOSE, CAPILLARY
GLUCOSE-CAPILLARY: 139 mg/dL — AB (ref 65–99)
GLUCOSE-CAPILLARY: 137 mg/dL — AB (ref 65–99)
Glucose-Capillary: 187 mg/dL — ABNORMAL HIGH (ref 65–99)
Glucose-Capillary: 161 mg/dL — ABNORMAL HIGH (ref 65–99)

## 2017-02-25 LAB — BPAM RBC
Blood Product Expiration Date: 201803282359
ISSUE DATE / TIME: 201803041350
Unit Type and Rh: 5100

## 2017-02-25 LAB — CARBOXYHEMOGLOBIN - COOX: CARBOXYHEMOGLOBIN: 1 % (ref 0.5–1.5)

## 2017-02-25 LAB — TYPE AND SCREEN
ABO/RH(D): B POS
Antibody Screen: NEGATIVE
Unit division: 0

## 2017-02-25 LAB — BASIC METABOLIC PANEL
Anion gap: 5 (ref 5–15)
BUN: 43 mg/dL — AB (ref 6–20)
CO2: 30 mmol/L (ref 22–32)
Calcium: 8.8 mg/dL — ABNORMAL LOW (ref 8.9–10.3)
Chloride: 110 mmol/L (ref 101–111)
Creatinine, Ser: 1.81 mg/dL — ABNORMAL HIGH (ref 0.44–1.00)
GFR calc Af Amer: 30 mL/min — ABNORMAL LOW (ref >60)
GFR calc non Af Amer: 26 mL/min — ABNORMAL LOW (ref >60)
GLUCOSE: 130 mg/dL — AB (ref 65–99)
POTASSIUM: 4.2 mmol/L (ref 3.5–5.1)
Sodium: 145 mmol/L (ref 135–145)

## 2017-02-25 LAB — URINE CULTURE

## 2017-02-25 LAB — D-DIMER, QUANTITATIVE (NOT AT ARMC): D DIMER QUANT: 5.97 ug{FEU}/mL — AB (ref 0.00–0.50)

## 2017-02-25 LAB — BRAIN NATRIURETIC PEPTIDE: B Natriuretic Peptide: 908.6 pg/mL — ABNORMAL HIGH (ref 0.0–100.0)

## 2017-02-25 MED ORDER — FUROSEMIDE 10 MG/ML IJ SOLN
80.0000 mg | Freq: Two times a day (BID) | INTRAMUSCULAR | Status: DC
  Administered 2017-02-25 – 2017-02-26 (×2): 80 mg via INTRAVENOUS
  Filled 2017-02-25 (×2): qty 8

## 2017-02-25 MED ORDER — FUROSEMIDE 10 MG/ML IJ SOLN
40.0000 mg | Freq: Once | INTRAMUSCULAR | Status: AC
  Administered 2017-02-25: 40 mg via INTRAVENOUS

## 2017-02-25 MED ORDER — METOLAZONE 5 MG PO TABS: 5.0000 mg | ORAL_TABLET | Freq: Once | ORAL | Status: DC

## 2017-02-25 MED ORDER — CLONIDINE HCL 0.2 MG PO TABS
0.2000 mg | ORAL_TABLET | Freq: Two times a day (BID) | ORAL | Status: DC
  Administered 2017-02-25 – 2017-03-02 (×11): 0.2 mg via ORAL
  Filled 2017-02-25 (×3): qty 1
  Filled 2017-02-25: qty 2
  Filled 2017-02-25 (×3): qty 1
  Filled 2017-02-25: qty 2
  Filled 2017-02-25 (×2): qty 1
  Filled 2017-02-25 (×2): qty 2
  Filled 2017-02-25: qty 1

## 2017-02-25 MED ORDER — FUROSEMIDE 10 MG/ML IJ SOLN
40.0000 mg | Freq: Two times a day (BID) | INTRAMUSCULAR | Status: DC
  Administered 2017-02-25: 40 mg via INTRAVENOUS
  Filled 2017-02-25 (×2): qty 4

## 2017-02-25 MED ORDER — FUROSEMIDE 10 MG/ML IJ SOLN: 5.0000 mg | Freq: Once | INTRAMUSCULAR | Status: DC

## 2017-02-25 NOTE — Progress Notes (Signed)
PT Cancellation Note  Patient Details Name: Stephanie Hendricks MRN: [id number] DOB: [birth year]   Cancelled Treatment:    Reason Eval/Treat Not Completed: Other (comment) (pt with cardiac rehab and unavailable)   Vandy Fong B Cesario Weidinger 02/25/2017, 8:52 AM  Elwyn Reach, [place]

## 2017-02-25 NOTE — Progress Notes (Signed)
   CVP 12-13. CO-OX 59%.   Continue to diurese with IV lasix. BMET in am.   Amy Clegg NP-C  4:00 PM

## 2017-02-25 NOTE — Progress Notes (Addendum)
      [address]       [place] [address]             [phone]        9 Days Post-Op Procedure(s) (LRB): CORONARY ARTERY BYPASS GRAFTING (CABG) x 3 (LIMA to LAD, SVG SEQUENTIALLY to RAMUS INTERMEDIATE and DISTAL CIRCUMFLEX) WITH ENDOSCOPIC HARVESTING OF RIGHT SAPHENOUS VEIN (N/A) TRANSESOPHAGEAL ECHOCARDIOGRAM W/O CARDIOVERSION  Subjective: Patient awake with no specific complaints.  Objective: Vital signs in last 24 hours: Temp:  [97.3 F (36.3 C)-98.3 F (36.8 C)] 97.5 F (36.4 C) (03/05 0634) Pulse Rate:  [58-80] 79 (03/05 0634) Cardiac Rhythm: Other (Comment) (03/05 0351) Resp:  [20-22] 22 (03/05 0634) BP: (137-160)/(47-70) 152/56 (03/05 0634) SpO2:  [91 %-96 %] 95 % (03/05 0634) Weight:  [182 lb 8 oz (82.8 kg)] 182 lb 8 oz (82.8 kg) (03/05 0652)  Pre op weight 81 kg Current Weight  02/25/17 182 lb 8 oz (82.8 kg)      Intake/Output from previous day: 03/04 0701 - 03/05 0700 In: 885 [P.O.:480; Blood:405] Out: 1100 [Urine:1100]   Physical Exam:  Cardiovascular: RRR, systolic murmur Pulmonary: Slightly diminished at bases Abdomen: Soft, non tender, bowel sounds present. Extremities: Trace bilateral lower extremity edema. Wounds: Clean and dry.  No erythema or signs of infection.  Lab Results: CBC: Recent Labs  02/24/17 0407 02/25/17 0513  WBC 17.1* 17.2*  HGB 7.9* 8.7*  HCT 25.1* 27.1*  PLT 317 329   BMET:  Recent Labs  02/24/17 0407 02/25/17 0513  NA 143 145  K 5.0 4.2  CL 107 110  CO2 29 30  GLUCOSE 142* 130*  BUN 45* 43*  CREATININE 1.87* 1.81*  CALCIUM 9.0 8.8*    PT/INR:  Lab Results  Component Value Date   INR 1.52 02/16/2017   INR 0.94 02/16/2017   ABG:  INR: Will add last result for INR, ABG once components are confirmed Will add last 4 CBG results once components are confirmed  Assessment/Plan:  1.CV-Post op a fib with RVR. On pacer this am-set at 70. She is on Amiodarone 200 mg daily and Norvasc  10 mg daily. Will start Clonidine 0.2 mg bid for better BP control. 2. Pulmonary-On 2 liters of oxygen via [place]. Will wean as able.  Encourage incentive spirometer. 3. Leukocytosis-WBC remains 17,200. UA negative-await UC. No sign of wound infection. Await CXR result this am. 4. ABL anemia-H and H increased to  8.7 and 27.1 (s/p transfusion). Continue Folic acid and oral Ferrous 5. Creatinine slightly decreased to 1.81 7. DM-CBGs 194/191/139. On Insulin and Glipizide once creatinine decreases. HGA1C 6. 8. Will need rehab once ready for discharge  ZIMMERMAN,Stephanie Hendricks 02/25/2017,7:14 AM  Patient has regressed since transfer to step down walking less Cr increased to 1.8  I have seen and examined Stephanie Hendricks and agree with the above assessment  and plan.  Grace Isaac MD [place] [phone] Office [phone] 02/25/2017 5:21 PM

## 2017-02-25 NOTE — Progress Notes (Signed)
PT Cancellation Note  Patient Details Name: Stephanie Hendricks MRN: [id number] DOB: 1941-04-04   Cancelled Treatment:    Reason Eval/Treat Not Completed: Medical issues which prohibited therapy (RN reports pt with respiratory difficulty and planning on moving to higher level of care. Will attempt next date)   [name] 02/25/2017, 11:49 AM  Elwyn Reach, [place]

## 2017-02-25 NOTE — Progress Notes (Signed)
[facility] BRIEF SICU PROGRESS NOTE  9 Days Post-Op  S/P Procedure(s) (LRB): CORONARY ARTERY BYPASS GRAFTING (CABG) x 3 (LIMA to LAD, SVG SEQUENTIALLY to RAMUS INTERMEDIATE and DISTAL CIRCUMFLEX) WITH ENDOSCOPIC HARVESTING OF RIGHT SAPHENOUS VEIN (N/A) TRANSESOPHAGEAL ECHOCARDIOGRAM W/O CARDIOVERSION   Reportedly somewhat improved after return to SICU NSR w/ stable BP Breathing comfortably w/ O2 sats 96%  Diuresing some  Plan: Continue current plan  Rexene Alberts, MD 02/25/2017 6:58 PM

## 2017-02-25 NOTE — Plan of Care (Signed)
Problem: Bowel/Gastric: Goal: Will not experience complications related to bowel motility Outcome: Progressing Pt passing gas

## 2017-02-25 NOTE — Progress Notes (Signed)
CARDIAC REHAB PHASE I   PRE:  Rate/Rhythm: 85 pacing    BP: sitting 146/66    SaO2: 83 2L in bed, up to 89 4L in bed  MODE:  Ambulation: to [facility] then back to bed   POST:  Rate/Rhythm: 85 pacing    BP: sitting 141/64     SaO2: 85 4L standing after BSC, after getting back to bed and resting 90 4L  Pt with hypoxia this am, some wheezing. SaO2 responded with some pursed lip breathing (pt unable to do well) and increasing O2 to 4L. However after using BSC, SAO2 back down to 85 4L and pt with flat affect, slow to respond. Transport came for pt for CXR so back to bed. Pt saO2 slowly up to 90 4L in bed. Still with flat affect, ? Mentation. RN aware. Will f/u tomorrow. She needs to sit up in recliner and use IS more. [place], ACSM 02/25/2017 9:20 AM

## 2017-02-25 NOTE — Progress Notes (Signed)
CSW is continuing to assist patient with support and discharge needs. Patient does have two bed offers in the [place] area.  Rhea Pink, MSW,  [place]

## 2017-02-25 NOTE — Progress Notes (Signed)
I await insurance decision on possible inpt rehab admission when pt medically ready. I met with pt at bedside and she is aware. [phone]

## 2017-02-25 NOTE — Consult Note (Signed)
Advanced Heart Failure Team Consult Note  Referring Physician: Dr Servando Snare  Primary Physician:  Dr Nevada Crane  Primary Cardiologist:  Dr Domenic Polite  Reason for Consultation:   HPI:   Ms Freking is a 76 year old with history of sinus brady, CKD, DM, HTN, hyperlipidemia admitted with chest pain. Taken to cath lab and with severe 3 vessel disease with IABP placed. CT surgery consulted and she underwent  CABG x 3on 02/16/17.   IABP removed on 2/26. Extubated on 2/27. All drips gradually weaned off. Brief episode of A fib on 2/28 so amio drip started and later transitioned to amio 200 mg twice a day on 3/1.   Had volume overload on 3/1 --> diuresed with 40 mg IV lasix twice daily. Transitioned to po lasix on 3/3.   Developed sinus brady on 3/4--> lopressor was stopped. Hemoglobin dropped down to 7.9. Given 1 UPRBCs.  Todays hgb up to 8.7. No obvious source of bleeding.   Earlier today she had an episode of hypoxia (84%) while walking with cardiac rehab + increased WOB. Oxygen was increased from 2 liters to 5 liters. Give Xopenex and 80 mg IV lasix. CXR with worsening interstitial edema. Orders placed to transfer to ICU.    LHC  02/16/17.   Prox RCA to Mid RCA lesion, 10 %stenosed.  Ost LM to LM lesion, 99 %stenosed.  Ost Cx to Prox Cx lesion, 50 %stenosed.  Ost 1st Mrg to 1st Mrg lesion, 90 %stenosed.  Prox LAD lesion, 80 %stenosed.  Review of Systems: [y] = yes, [ ]  = no   General: Weight gain [ ] ; Weight loss [ ] ; Anorexia [ ] ; Fatigue [Y ]; Fever [ ] ; Chills [ ] ; Weakness [ ]   Cardiac: Chest pain/pressure [ ] ; Resting SOB [ Y]; Exertional SOB [Y ]; Orthopnea [ ] ; Pedal Edema [ T]; Palpitations [ ] ; Syncope [ ] ; Presyncope [ ] ; Paroxysmal nocturnal dyspnea[ ]   Pulmonary: Cough [ ] ; Wheezing[ ] ; Hemoptysis[ ] ; Sputum [ ] ; Snoring [ ]   GI: Vomiting[ ] ; Dysphagia[ ] ; Melena[ ] ; Hematochezia [ ] ; Heartburn[ ] ; Abdominal pain [ ] ; Constipation [ ] ; Diarrhea [ ] ; BRBPR [ ]   GU: Hematuria[ ] ;  Dysuria [ ] ; Nocturia[ ]   Vascular: Pain in legs with walking [ ] ; Pain in feet with lying flat [ ] ; Non-healing sores [ ] ; Stroke [ ] ; TIA [ ] ; Slurred speech [ ] ;  Neuro: Headaches[ ] ; Vertigo[ ] ; Seizures[ ] ; Paresthesias[ ] ;Blurred vision [ ] ; Diplopia [ ] ; Vision changes [ ]   Ortho/Skin: Arthritis [ ] ; Joint pain [Y ]; Muscle pain [ ] ; Joint swelling [ ] ; Back Pain [ ] ; Rash [ ]   Psych: Depression[ ] ; Anxiety[ ]   Heme: Bleeding problems [ ] ; Clotting disorders [ ] ; Anemia [ ]   Endocrine: Diabetes [ Y]; Thyroid dysfunction[ ]   Home Medications Prior to Admission medications   Medication Sig Start Date End Date Taking? Authorizing Provider  amLODipine-valsartan (EXFORGE) 10-320 MG tablet Take 1 tablet by mouth daily. 02/11/17  Yes Lendon Colonel, NP  aspirin 81 MG tablet Take 81 mg by mouth daily.     Yes Historical Provider, MD  cloNIDine (CATAPRES - DOSED IN MG/24 HR) 0.3 mg/24hr patch APPLY 1 PATCH ONCE WEEKLY. 02/08/17  Yes Satira Sark, MD  eplerenone (INSPRA) 50 MG tablet Take 50 mg by mouth daily. 01/21/17  Yes Historical Provider, MD  glipiZIDE (GLIPIZIDE XL) 5 MG 24 hr tablet Take 1 tablet (5 mg total) by mouth daily with breakfast.  Do not take this medication if your blood sugar is less than 110. 11/07/14  Yes Rexene Alberts, MD  Linagliptin-Metformin HCl (JENTADUETO) 2.5-500 MG TABS Take 1 tablet by mouth 2 (two) times daily. Do not take this medication if your blood sugar is less than 110. 11/07/14  Yes Rexene Alberts, MD  simvastatin (ZOCOR) 20 MG tablet TAKE (1) TABLET BY MOUTH ONCE DAILY AT BEDTIME FOR CHOLESTEROL. 01/08/17  Yes Satira Sark, MD    Past Medical History: Past Medical History:  Diagnosis Date  . Aortic stenosis    Minimal  . Bradycardia   . Chronic kidney disease, stage III (moderate)   . Degenerative joint disease   . Diabetes mellitus type II   . Essential hypertension, benign   . Hyperlipidemia   . Lower extremity edema   . Microcytic anemia    . Palpitations     Past Surgical History: Past Surgical History:  Procedure Laterality Date  . COLONOSCOPY  08/28/2011   sigmoid colon serated adenoma, next colonoscopy 08/2016  . COLONOSCOPY W/ POLYPECTOMY  2005   Dr. Tamela Oddi resected per patient  . CORONARY ARTERY BYPASS GRAFT N/A 02/16/2017   Procedure: CORONARY ARTERY BYPASS GRAFTING (CABG) x 3 (LIMA to LAD, SVG SEQUENTIALLY to RAMUS INTERMEDIATE and DISTAL CIRCUMFLEX) WITH ENDOSCOPIC HARVESTING OF RIGHT SAPHENOUS VEIN;  Surgeon: Grace Isaac, MD;  Location: [facility];  Service: Open Heart Surgery;  Laterality: N/A;  . ESOPHAGOGASTRODUODENOSCOPY     remote, foreign body extraction  . ESOPHAGOGASTRODUODENOSCOPY  08/28/2011   hiatal hernia, erosion on fold of diaphragmatic hiatus (cameron lesion), antral and prepylori erosions, SB bx negative, gastric bx showed reactive gastropathy but no H.Pylori  . HEMORROIDECTOMY    . LEFT HEART CATH AND CORONARY ANGIOGRAPHY N/A 02/16/2017   Procedure: Left Heart Cath and Coronary Angiography;  Surgeon: Burnell Blanks, MD;  Location: [facility];  Service: Cardiovascular;  Laterality: N/A;  . TONSILLECTOMY    . TUBAL LIGATION      Family History: Family History  Problem Relation Age of Onset  . Hypertension Father   . Coronary artery disease Sister   . Heart attack Brother     Deceased  . Colon cancer Neg Hx   . Liver disease Neg Hx     Social History: Social History   Social History  . Marital status: Widowed    Spouse name: N/A  . Number of children: 2  . Years of education: N/A   Social History Main Topics  . Smoking status: Never Smoker  . Smokeless tobacco: Never Used  . Alcohol use No  . Drug use: No  . Sexual activity: Not Asked   Other Topics Concern  . None   Social History Narrative  . None    Allergies:  No Known Allergies  Objective:    Vital Signs:   Temp:  [97.3 F (36.3 C)-98.3 F (36.8 C)] 97.5 F (36.4 C) (03/05 0634) Pulse  Rate:  [58-80] 79 (03/05 0634) Resp:  [20-22] 22 (03/05 0634) BP: (137-152)/(47-70) 141/64 (03/05 0956) SpO2:  [90 %-96 %] 90 % (03/05 1101) Weight:  [182 lb 8 oz (82.8 kg)] 182 lb 8 oz (82.8 kg) (03/05 0652) Last BM Date: 02/21/17  Weight change: Filed Weights   02/23/17 0417 02/24/17 0416 02/25/17 0652  Weight: 184 lb 1.6 oz (83.5 kg) 185 lb 1.6 oz (84 kg) 182 lb 8 oz (82.8 kg)    Intake/Output:   Intake/Output Summary (Last 24 hours) at 02/25/17  1230 Last data filed at 02/25/17 0705  Gross per 24 hour  Intake              405 ml  Output             1100 ml  Net             -695 ml     Physical Exam: General:  Elderly in bed. No resp difficulty HEENT: normal Neck: supple. JVP to jaw . Carotids 2+ bilat; no bruits. No lymphadenopathy or thryomegaly appreciated. Cor: PMI nondisplaced. Regular rate & rhythm. No rubs, gallops or murmurs. Sternal incision approximated. No exudate.  Lungs: Decreased in the bases on 4 liteers.  Abdomen: soft, nontender, nondistended. No hepatosplenomegaly. No bruits or masses. Good bowel sounds. Extremities: no cyanosis, clubbing, rash. Warm R and LLE 1-2+ edema.  Neuro: alert & orientedx3, cranial nerves grossly intact. moves all 4 extremities w/o difficulty. Affect pleasant  Telemetry: A V Paced 70s   Labs: Basic Metabolic Panel:  Recent Labs Lab 02/21/17 0500 02/22/17 0423 02/23/17 0423 02/24/17 0407 02/25/17 0513  NA 145 144 143 143 145  K 3.4* 4.0 4.4 5.0 4.2  CL 110 106 106 107 110  CO2 29 28 29 29 30   GLUCOSE 103* 115* 136* 142* 130*  BUN 19 21* 35* 45* 43*  CREATININE 1.44* 1.46* 1.77* 1.87* 1.81*  CALCIUM 8.1* 8.5* 8.7* 9.0 8.8*    Liver Function Tests: No results for input(s): AST, ALT, ALKPHOS, BILITOT, PROT, ALBUMIN in the last 168 hours. No results for input(s): LIPASE, AMYLASE in the last 168 hours. No results for input(s): AMMONIA in the last 168 hours.  CBC:  Recent Labs Lab 02/21/17 0500 02/22/17 0423  02/23/17 0423 02/24/17 0407 02/25/17 0513  WBC 9.7 11.2* 14.5* 17.1* 17.2*  HGB 7.8* 8.2* 7.9* 7.9* 8.7*  HCT 23.8* 25.3* 24.9* 25.1* 27.1*  MCV 75.6* 77.1* 78.3 78.0 79.7  PLT 118* 167 222 317 329    Cardiac Enzymes: No results for input(s): CKTOTAL, CKMB, CKMBINDEX, TROPONINI in the last 168 hours.  BNP: BNP (last 3 results)  Recent Labs  02/16/17 0242 02/25/17 1128  BNP 176.0* 908.6*    ProBNP (last 3 results) No results for input(s): PROBNP in the last 8760 hours.   CBG:  Recent Labs Lab 02/24/17 1118 02/24/17 1621 02/24/17 2117 02/25/17 0612 02/25/17 1126  GLUCAP 165* 194* 191* 139* 187*    Coagulation Studies: No results for input(s): LABPROT, INR in the last 72 hours.  Other results: EKG:  Imaging: Dg Chest 2 View  Result Date: 02/25/2017 CLINICAL DATA:  Shortness of breath, status post CABG on February 16, 2017 EXAM: CHEST  2 VIEW COMPARISON:  PA and lateral chest x-ray of February 23, 2017. FINDINGS: The lungs are hypoinflated. Persistent bibasilar densities are present and on the left are slightly more conspicuous. The right hemidiaphragm remains obscured. The cardiac silhouette is enlarged. The pulmonary vascularity is engorged. The PICC line tip projects over the cavoatrial junction. Surgical wires are intact. Bilateral pleural effusions have increased slightly. IMPRESSION: CHF with pulmonary interstitial edema and bilateral pleural effusions. Bibasilar atelectasis. Overall the appearance of the chest has deteriorated slightly with slight worsening of interstitial edema. Electronically Signed   By: David  Martinique M.D.   On: 02/25/2017 09:45      Medications:     Current Medications: . amiodarone  200 mg Oral Daily  . amLODipine  10 mg Oral Daily  . aspirin EC  81 mg  Oral Daily  . atorvastatin  40 mg Oral q1800  . Chlorhexidine Gluconate Cloth  6 each Topical Daily  . cloNIDine  0.2 mg Oral BID  . enoxaparin (LOVENOX) injection  30 mg Subcutaneous  Q24H  . ferrous Q000111Q C-folic acid  1 capsule Oral BID BM  . furosemide  40 mg Intravenous Q12H  . glipiZIDE  5 mg Oral Q breakfast  . insulin aspart  0-24 Units Subcutaneous TID AC & HS  . mouth rinse  15 mL Mouth Rinse BID  . pantoprazole  40 mg Oral QAC breakfast  . sodium chloride flush  10-40 mL Intracatheter Q12H  . sodium chloride flush  3 mL Intravenous Q12H     Infusions:    Assessment/Plan/Discussion    1. CAD--->NSTEMI--> S/P CABG x3 on 2/24. Off all drips. Pain controlled.  2. A/C Diastolic Heart Failure - ECHO 02/21/17 EF 65-70% Grade IDD CXR with increased interstitial edema. Volume overloaded today. Appears warm and wet on exam. Will check CO-OX. If low will need to add milrinone. Creatinine trending up so will need to carefully diurese. For now start lasix 80 mg IV twice a day. Check CVP to guide diuresis.  3. A fib- 2/28 with amio drip. Transitioned to po amio 200 mg twice a day on 3/1. IF milrinone added will likely need IV amio again.  4. HTN- elevated on clonidine 0.2 mg twice a day + amlodipine 10 mg daily. May need to add hydralazine but will see CO-OX results first. Addition of milrinone will lower BP.  5. Elevated WBC- WBC 17.2.  Urine CX pending. Sternal incision approximated. No evidence of infection. Needs aggressive pulmonary toilet.  6. Acute Respiratory Failure - O2 increased from 2> 5 liters. ABG completed.  7. AKI on CKD- Creatinine trending up 1.4>1.8.Follow closely.   8. DMII- on glipizide . On sliding scale.  9. Severe deconditioning- Acute Rehab following for possible CIR once stabilized.   Length of Stay: [place] NP-C  02/25/2017, 12:30 PM  Advanced Heart Failure Team Pager [phone] (M-F; 7a - 4p)  Please contact [facility] for night-coverage after hours (4p -7a ) and weekends on [email or web address]  Patient seen with NP, agree with the above note.  She is s/p CABG, now with volume overload on exam and elevated CVP.  Co-ox  preserved.   - Will increase Lasix to 80 mg IV bid.  Follow creatinine closely.   - BP controlled on current regimen.   She had brief post-op atrial fibrillation, remains in NSR on amiodarone.  If no further atrial fibrillation, would stop amiodarone at 30 days and hold off on warfarin.   Loralie Champagne 02/25/2017 4:14 PM

## 2017-02-25 NOTE — Discharge Summary (Signed)
Physician Discharge Summary  Patient ID: Stephanie Hendricks MRN: [id number] DOB/AGE: Mar 21, 1941 76 y.o.  Admit date: 02/16/2017 Discharge date: 03/02/2017  Admission Diagnoses:  Patient Active Problem List   Diagnosis Date Noted  . AKI (acute kidney injury) ([place])   . Stage 3 chronic kidney disease   . Benign essential HTN   . Diastolic dysfunction   . Acute blood loss anemia   . Leukocytosis   . Tachypnea   . NSTEMI (non-ST elevated myocardial infarction) ([place]) 02/16/2017  . Coronary artery disease 02/16/2017  . Acute gastroenteritis 11/07/2014  . Intractable nausea and vomiting 11/06/2014  . Obesity 11/06/2014  . Chronic kidney disease (CKD), stage III (moderate) 11/06/2014  . Microcytic anemia 11/06/2014  . Aortic valve disorders 10/22/2012  . Bradycardia   . Essential hypertension   . Hyperlipidemia   . Type II diabetes mellitus with nephropathy ([place])   . Hx of colonic polyps 08/21/2011   Discharge Diagnoses:   Patient Active Problem List   Diagnosis Date Noted  . Acute on chronic diastolic CHF (congestive heart failure) ([place])   . S/P CABG x 3   . AKI (acute kidney injury) ([place])   . Stage 3 chronic kidney disease   . Benign essential HTN   . Diastolic dysfunction   . Acute blood loss anemia   . Leukocytosis   . Tachypnea   . NSTEMI (non-ST elevated myocardial infarction) ([place]) 02/16/2017  . Coronary artery disease 02/16/2017  . Acute gastroenteritis 11/07/2014  . Intractable nausea and vomiting 11/06/2014  . Obesity 11/06/2014  . Chronic kidney disease (CKD), stage III (moderate) 11/06/2014  . Microcytic anemia 11/06/2014  . Aortic valve disorders 10/22/2012  . Bradycardia   . Essential hypertension   . Hyperlipidemia   . Type II diabetes mellitus with nephropathy ([place])   . Hx of colonic polyps 08/21/2011   Discharged Condition: good  History of Present Illness:  Stephanie Hendricks is a 59 yo AA female with history of Sinus Bradycardia, CKD, DM, HTN, and  Hyperlipidemia.  She presented to the ED at [facility] hospital with complaints of chest pain over the past 4 night.  On day of presentation on 02/16/2017 she developed severe chest pressure with radiation to her jaw, ears, and back.  EKG was obtained and showed diffuse ST depression with ST elevation in AVR.  Troponin came back at 0.08.  She was transferred to [facility] and taken straight to the cath lab for STEMI.  Catheterization revealed severe 3 vessel CAD with LM involvement.  It was felt emergent coronary bypass grafting would be indicated and TCTS consult was obtained.  Dr. Servando Snare evaluated the patient and was in agreement the patient would benefit from emergency coronary bypass grafting.  The risks and benefits of the procedure were explained to the patient and she was agreeable to proceed.  Hospital Course:   She was taken to the operating room and underwent Emergent CABG x 3 utilizing to LAD, and Sequential SVG to Ramus Intermediate and Distal Circumflex.  She also underwent endoscopic harvest of greater saphenous vein.  She tolerated the procedure without difficulty and was taken to the SICU in stable condition.  During her stay in the SICU she was weaned off drip as tolerated. She was weaned off her IABP on POD #2.  The patient was weaned and extubated on POD #3.  She was weaned off Milrinone and Dopamine as tolerated.  She developed Rapid Atrial Fibrillation and was treated with IV Amiodarone.  She  was hypertensive and her medications were titrated.  She was aggressively diuresed for hypervolemia.  She developed Thrombocytopenia which resolved prior to discharge.  She was severely deconditioned and PT/OT consults recommended inpatient rehab.  She was felt medically stable for transfer to the stepdown unit on 02/22/2017.  The patient remained slow to progress.  She was refusing to participate with rehab staff.  She required transfusion for acute post operative blood loss anemia.  She developed  leukocytosis, UA was negative.  CXR was obtained and showed worsening pulmonary edema.  She developed worsening oxygen saturations and shortness of breath.  She ultimately required transfer back to the SICU.  She was aggressively diuresed with elevated BNP of 908.  Heart Failure was consulted Patient's  co-Ox was good at 60% and she had preserved EF.  There was minor concern about PE.  She was on Lovenox for DVT prophylaxis.  D-Dimer was elevated at 5.97.  Due to stage IV CKD and improvement of symptoms with diuresis, she did not undergo CT scan.  She continued to improve clinically and was transferred to the PCTU for further convalescence on 02/27/2017. She is on Lasix 40 mg bid currently and continues to diureses well. Epicardial pacing wires were removed on 03/09. Chest tube sutures will be removed prior to discharge. She is still requiring 2 liters of oxygen via . We will attempt to wean her to room air, but she may need oxygen at discharge. She is deconditioned and it is felt that a SNF will be needed at discharge.                 Significant Diagnostic Studies: angiography:    Prox RCA to Mid RCA lesion, 10 %stenosed.  Ost LM to LM lesion, 99 %stenosed.  Ost Cx to Prox Cx lesion, 50 %stenosed.  Ost 1st Mrg to 1st Mrg lesion, 90 %stenosed.  Prox LAD lesion, 80 %stenosed.   1. Unstable angina/NSTEMI 2. Severe distal left main artery stenosis 3. Severe stenosis proximal LAD 4. Severe stenosis ostium OM1 5. Elevated LVEDP  Recommendations: Emergent CABG. IABP in place. Dr. Servando Snare is present in the cath lab.   Treatments: surgery:    Emergency coronary artery bypass grafting x3 with the left internal mammary to the left anterior descending coronary artery; reverse saphenous vein graft sequentially to the ramus and distal circumflex with right thigh and endovein harvesting.  Disposition: 01-Home or Self Care   Discharge Medications:  The patient has been discharged  on:   1.Beta Blocker:  Yes [   ]                              No   [ x  ]                              If No, reason: Bradycardia  2.Ace Inhibitor/ARB: Yes [   ]                                     No  [ x   ]                                     If No, reason: elevated creatinine 3.Statin:  Yes [ x  ]                  No  [   ]                  If No, reason:  4.Ecasa:  Yes  [ x  ]                  No   [   ]                  If No, reason:    Allergies as of 03/02/2017   No Known Allergies     Medication List    STOP taking these medications   amLODipine-valsartan 10-320 MG tablet Commonly known as:  EXFORGE   cloNIDine 0.3 mg/24hr patch Commonly known as:  CATAPRES - Dosed in mg/24 hr Replaced by:  cloNIDine 0.2 MG tablet   eplerenone 50 MG tablet Commonly known as:  INSPRA     TAKE these medications   acetaminophen 325 MG tablet Commonly known as:  TYLENOL Take 2 tablets (650 mg total) by mouth every 6 (six) hours as needed for mild pain.   amiodarone 200 MG tablet Commonly known as:  PACERONE Take 1 tablet (200 mg total) by mouth daily.   amLODipine 10 MG tablet Commonly known as:  NORVASC Take 1 tablet (10 mg total) by mouth daily.   aspirin 81 MG tablet Take 81 mg by mouth daily.   cephALEXin 500 MG capsule Commonly known as:  KEFLEX Take 1 capsule (500 mg total) by mouth every 8 (eight) hours. For 7 Days   cloNIDine 0.2 MG tablet Commonly known as:  CATAPRES Take 1 tablet (0.2 mg total) by mouth 2 (two) times daily. Replaces:  cloNIDine 0.3 mg/24hr patch   furosemide 40 MG tablet Commonly known as:  LASIX Take 1 tablet (40 mg total) by mouth 2 (two) times daily.   glipiZIDE 5 MG 24 hr tablet Commonly known as:  GLIPIZIDE XL Take 1 tablet (5 mg total) by mouth daily with breakfast. Do not take this medication if your blood sugar is less than 110.   Linagliptin-Metformin HCl 2.5-500 MG Tabs Commonly known as:  JENTADUETO Take 1 tablet by  mouth 2 (two) times daily. Do not take this medication if your blood sugar is less than 110.   potassium chloride SA 20 MEQ tablet Commonly known as:  K-DUR,KLOR-CON Take 2 tablets (40 mEq total) by mouth daily.   simvastatin 20 MG tablet Commonly known as:  ZOCOR TAKE (1) TABLET BY MOUTH ONCE DAILY AT BEDTIME FOR CHOLESTEROL.   traMADol 50 MG tablet Commonly known as:  ULTRAM Take 1 tablet (50 mg total) by mouth every 6 (six) hours as needed for severe pain.      Follow-up Information    Grace Isaac, MD Follow up on 04/04/2017.   Specialty:  Cardiothoracic Surgery Why:  Appoiontment is at 2:30, please get CXR at 2:00 at [facility] located on first floor of our office building Contact information: [place] [address] [phone]        Jory Sims, NP Follow up on 03/14/2017.   Specialties:  Nurse Practitioner, Radiology, Cardiology Why:  Appointment is at 1:30 Contact information: [place] [address] [phone]           Signed: Ellwood Handler 03/02/2017, 8:50 AM

## 2017-02-25 NOTE — Progress Notes (Addendum)
Called by nurse in regards to patient with acute onset worsening shortness of breath.  Nurse states patient Sats are in the 80s on 4L of oxgyen.  On Exam:  Gen: patient visibly winded, complains of shortness of breath Heart: RRR, paced... NSR in the 70s under pacer Lung: diminished on right, CTA on left Ext: bilateral edema, denies leg tenderness  CXR: this AM shows bilateral effusions, worsening interstitial edema   A/P:  1. Pulmonary edema vs. Acute Pulmonary Embolism--- I spoke with Dr. Servando Snare over the phone and plan is as follows    IV Lasix 80 mg now   Add BNP to morning labs   Get Co-OX and D-Dimer    Insert Foley catheter for accurate I/O   Consult Advanced Heart Failure   Will move patient back to SICU for closer monitoring... Hold off on CT scan for now... If D-Dimer is elevated will proceed with CT scan at that time.   BARRETT, ERIN, PA-C  D dimer mildly elevated  , suspect fluid overload not pulmonary embolus  Agree with HF team, iv lasix  I have seen and examined Stephanie Hendricks and agree with the above assessment  and plan.  Grace Isaac MD [facility] [phone] Office [phone] 02/25/2017 5:28 PM

## 2017-02-26 LAB — BASIC METABOLIC PANEL
Anion gap: 10 (ref 5–15)
BUN: 36 mg/dL — AB (ref 6–20)
CO2: 33 mmol/L — ABNORMAL HIGH (ref 22–32)
CREATININE: 1.6 mg/dL — AB (ref 0.44–1.00)
Calcium: 9 mg/dL (ref 8.9–10.3)
Chloride: 103 mmol/L (ref 101–111)
GFR calc Af Amer: 34 mL/min — ABNORMAL LOW (ref >60)
GFR, EST NON AFRICAN AMERICAN: 30 mL/min — AB (ref >60)
GLUCOSE: 83 mg/dL (ref 65–99)
POTASSIUM: 3.5 mmol/L (ref 3.5–5.1)
Sodium: 146 mmol/L — ABNORMAL HIGH (ref 135–145)

## 2017-02-26 LAB — CBC
HEMATOCRIT: 27.4 % — AB (ref 36.0–46.0)
Hemoglobin: 8.7 g/dL — ABNORMAL LOW (ref 12.0–15.0)
MCH: 25.4 pg — ABNORMAL LOW (ref 26.0–34.0)
MCHC: 31.8 g/dL (ref 30.0–36.0)
MCV: 80.1 fL (ref 78.0–100.0)
Platelets: 382 10*3/uL (ref 150–400)
RBC: 3.42 MIL/uL — ABNORMAL LOW (ref 3.87–5.11)
RDW: 18.5 % — AB (ref 11.5–15.5)
WBC: 16.3 10*3/uL — ABNORMAL HIGH (ref 4.0–10.5)

## 2017-02-26 LAB — COOXEMETRY PANEL
Carboxyhemoglobin: 0.8 % (ref 0.5–1.5)
METHEMOGLOBIN: 0.7 % (ref 0.0–1.5)
O2 Saturation: 50.4 %
Total hemoglobin: 16.3 g/dL — ABNORMAL HIGH (ref 12.0–16.0)

## 2017-02-26 LAB — GLUCOSE, CAPILLARY
GLUCOSE-CAPILLARY: 157 mg/dL — AB (ref 65–99)
GLUCOSE-CAPILLARY: 131 mg/dL — AB (ref 65–99)
Glucose-Capillary: 89 mg/dL (ref 65–99)
Glucose-Capillary: 176 mg/dL — ABNORMAL HIGH (ref 65–99)

## 2017-02-26 MED ORDER — FUROSEMIDE 40 MG PO TABS
40.0000 mg | ORAL_TABLET | Freq: Two times a day (BID) | ORAL | Status: DC
  Administered 2017-02-26 – 2017-03-02 (×9): 40 mg via ORAL
  Filled 2017-02-26 (×8): qty 1

## 2017-02-26 MED ORDER — SODIUM CHLORIDE 0.9 % IV SOLN
30.0000 meq | Freq: Once | INTRAVENOUS | Status: AC
  Administered 2017-02-26: 30 meq via INTRAVENOUS
  Filled 2017-02-26: qty 15

## 2017-02-26 NOTE — Progress Notes (Signed)
Noted transfer back to ICU. I await further progress with therapy before again pursuing insurance approval for a possible inpt rehab admission when medically stable.[id number]

## 2017-02-26 NOTE — Progress Notes (Signed)
Advanced Heart Failure Rounding Note   Subjective:    Yesterday had an episode of hypoxia (84%) while walking with cardiac rehab + increased WOB. Oxygen was increased from 2 liters to 5 liters. Give Xopenex and 80 mg IV lasix. CXR with worsening interstitial edema. Orders placed to transfer to ICU.  Diuresed with IV lasix.  Brisk diuresis noted. Weight down 2 pounds. CO-OX was 60% so inotropes not needed.   Denies SOB/CP.     Objective:   Weight Range:  Vital Signs:   Temp:  [97.4 F (36.3 C)-98.8 F (37.1 C)] 97.9 F (36.6 C) (03/06 0700) Pulse Rate:  [55-81] 73 (03/06 0800) Resp:  [21-33] 23 (03/06 0800) BP: (105-159)/(42-86) 150/59 (03/06 0800) SpO2:  [88 %-100 %] 95 % (03/06 0800) Weight:  [180 lb 5.4 oz (81.8 kg)] 180 lb 5.4 oz (81.8 kg) (03/06 0500) Last BM Date: 02/25/17  Weight change: Filed Weights   02/24/17 0416 02/25/17 0652 02/26/17 0500  Weight: 185 lb 1.6 oz (84 kg) 182 lb 8 oz (82.8 kg) 180 lb 5.4 oz (81.8 kg)    Intake/Output:   Intake/Output Summary (Last 24 hours) at 02/26/17 0850 Last data filed at 02/26/17 0800  Gross per 24 hour  Intake              395 ml  Output             3265 ml  Net            -2870 ml    Physical Exam: CVP 6 General:  Elderly in the chair. No resp difficulty HEENT: normal Neck: supple. JVP 6-7 . Carotids 2+ bilat; no bruits. No lymphadenopathy or thryomegaly appreciated. Cor: PMI nondisplaced. Regular rate & rhythm. No rubs, gallops or murmurs. Sternal incision approximated. No exudate.  Lungs: Decreased in the bases on  2 liters.  Abdomen: soft, nontender, nondistended. No hepatosplenomegaly. No bruits or masses. Good bowel sounds. Extremities: no cyanosis, clubbing, rash. Warm R and LLE 1-2+ edema.  Neuro: alert & orientedx3, cranial nerves grossly intact. moves all 4 extremities w/o difficulty. Affect pleasant GU: Foley   Telemetry: A V Paced 70s  Labs: Basic Metabolic Panel:  Recent Labs Lab  02/22/17 0423 02/23/17 0423 02/24/17 0407 02/25/17 0513 02/26/17 0350  NA 144 143 143 145 146*  K 4.0 4.4 5.0 4.2 3.5  CL 106 106 107 110 103  CO2 28 29 29 30  33*  GLUCOSE 115* 136* 142* 130* 83  BUN 21* 35* 45* 43* 36*  CREATININE 1.46* 1.77* 1.87* 1.81* 1.60*  CALCIUM 8.5* 8.7* 9.0 8.8* 9.0    Liver Function Tests: No results for input(s): AST, ALT, ALKPHOS, BILITOT, PROT, ALBUMIN in the last 168 hours. No results for input(s): LIPASE, AMYLASE in the last 168 hours. No results for input(s): AMMONIA in the last 168 hours.  CBC:  Recent Labs Lab 02/22/17 0423 02/23/17 0423 02/24/17 0407 02/25/17 0513 02/26/17 0350  WBC 11.2* 14.5* 17.1* 17.2* 16.3*  HGB 8.2* 7.9* 7.9* 8.7* 8.7*  HCT 25.3* 24.9* 25.1* 27.1* 27.4*  MCV 77.1* 78.3 78.0 79.7 80.1  PLT 167 222 317 329 382    Cardiac Enzymes: No results for input(s): CKTOTAL, CKMB, CKMBINDEX, TROPONINI in the last 168 hours.  BNP: BNP (last 3 results)  Recent Labs  02/16/17 0242 02/25/17 1128  BNP 176.0* 908.6*    ProBNP (last 3 results) No results for input(s): PROBNP in the last 8760 hours.    Other results:  Imaging: Dg Chest 2 View  Result Date: 02/25/2017 CLINICAL DATA:  Shortness of breath, status post CABG on February 16, 2017 EXAM: CHEST  2 VIEW COMPARISON:  PA and lateral chest x-ray of February 23, 2017. FINDINGS: The lungs are hypoinflated. Persistent bibasilar densities are present and on the left are slightly more conspicuous. The right hemidiaphragm remains obscured. The cardiac silhouette is enlarged. The pulmonary vascularity is engorged. The PICC line tip projects over the cavoatrial junction. Surgical wires are intact. Bilateral pleural effusions have increased slightly. IMPRESSION: CHF with pulmonary interstitial edema and bilateral pleural effusions. Bibasilar atelectasis. Overall the appearance of the chest has deteriorated slightly with slight worsening of interstitial edema. Electronically  Signed   By: David  Martinique M.D.   On: 02/25/2017 09:45      Medications:     Scheduled Medications: . amiodarone  200 mg Oral Daily  . amLODipine  10 mg Oral Daily  . aspirin EC  81 mg Oral Daily  . atorvastatin  40 mg Oral q1800  . Chlorhexidine Gluconate Cloth  6 each Topical Daily  . cloNIDine  0.2 mg Oral BID  . enoxaparin (LOVENOX) injection  30 mg Subcutaneous Q24H  . ferrous Q000111Q C-folic acid  1 capsule Oral BID BM  . furosemide  80 mg Intravenous Q12H  . glipiZIDE  5 mg Oral Q breakfast  . insulin aspart  0-24 Units Subcutaneous TID AC & HS  . mouth rinse  15 mL Mouth Rinse BID  . pantoprazole  40 mg Oral QAC breakfast  . sodium chloride flush  10-40 mL Intracatheter Q12H  . sodium chloride flush  3 mL Intravenous Q12H     Infusions:   PRN Medications:  sodium chloride, acetaminophen, guaiFENesin, levalbuterol, ondansetron **OR** ondansetron (ZOFRAN) IV, sodium chloride flush, sodium chloride flush, sodium chloride flush, traMADol   Assessment/Plan/Discussion   1. CAD--->NSTEMI--> S/P CABG x3 on 2/24. Off all drips. Pain controlled.  2. A/C Diastolic Heart Failure - ECHO 02/21/17 EF 65-70% Grade IDD CXR 3/5 with increased interstitial edema.   CO-OX was stable 60% so inotropes needed. Repeat CO-OX now. CVP down to 6. Brisk diuresis noted. Stop IV lasix and start lasix 40 mg po twice a day.  Renal function improving. Add ted hose.  3. A fib- 2/28 with amio drip. Transitioned to po amio 200 mg twice a day on 3/1. She had brief post-op atrial fibrillation, remains in NSR.  If no further atrial fibrillation, would stop amiodarone at 30 days and hold off on warfarin. 4. HTN- Stable Continue clonidine 0.2 mg twice a day + amlodipine 10 mg daily.   5. Elevated WBC- WBC 17.2>16.3.  Urine CX pending. Sternal incision approximated. No evidence of infection. Needs aggressive pulmonary toilet.  6. Acute Respiratory Failure - Improved. Continue 2  Liters [place].    7. AKI on CKD- Creatinine trending up 1.4>1.8>1.6 .Follow closely.   8. DMII- on glipizide . On sliding scale.  9. Severe deconditioning- Acute Rehab following for possible CIR once stabilized. PT following.  10. Sinus bradycardia - resolved off metoprolol.  Length of Stay: [place] NP-C  02/26/2017, 8:50 AM  Advanced Heart Failure Team Pager [phone] (M-F; 7a - 4p)  Please contact [facility] for night-coverage after hours (4p -7a ) and weekends on [email or web address]  Patient seen with NP, agree with the above note.  She diuresed well yesterday, CVP down to 6.  Will transition to po Lasix.  She remains in NSR on amiodarone.  Loralie Champagne 02/26/2017 9:37 AM

## 2017-02-26 NOTE — Progress Notes (Signed)
Occupational Therapy Treatment Patient Details Name: Stephanie Hendricks MRN: [id number] DOB: Feb 07, 1941 Today's Date: 02/26/2017    History of present illness [name] is a very pleasant 76 yo woman with past medical history of sinus bradycardia, CKD, DM, HTN, hyperlipidemia who is coming as a transfer from [facility] for high risk NSTEMI. 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    OT comments  Pt fatigued this session, but motivated to participate with OT. Making progress toward goals. Max A with LB ADL and mod A with toilet - bed transfers. Continue to recommend CIR for rehab. Will continue to follow acutely.   Follow Up Recommendations  CIR;Supervision/Assistance - 24 hour    Equipment Recommendations  3 in 1 bedside commode    Recommendations for Other Services      Precautions / Restrictions Precautions Precautions: Sternal;Fall Precaution Comments: able to recall sternal precautions Restrictions Weight Bearing Restrictions: Yes Other Position/Activity Restrictions: sternal precautions       Mobility Bed Mobility Overal bed mobility: Needs Assistance Bed Mobility: Sit to Sidelying         Sit to sidelying: Max assist General bed mobility comments: Pt ableto lean to L sidelying. A to lift BLE back onto bed  Transfers Overall transfer level: Needs assistance Equipment used: Rolling walker (2 wheeled)   Sit to Stand: Mod assist Stand pivot transfers: Min assist            Balance     Sitting balance-Leahy Scale: Fair       Standing balance-Leahy Scale: Poor                     ADL Overall ADL's : Needs assistance/impaired     Grooming: Set up;Oral care;Wash/dry face;Wash/dry Teacher, music: Moderate assistance;Stand-pivot Toilet Transfer Details (indicate cue type and reason): Increased assistance powering up to stand without use of B hand Toileting- Clothing Manipulation and Hygiene: Maximal assistance Toileting -  Clothing Manipulation Details (indicate cue type and reason): Pt helping to hold gown     Functional mobility during ADLs: Moderate assistance;Rolling walker;Cueing for safety;Cueing for sequencing General ADL Comments: VC for precautioins during ADL tasks      Vision                 Additional Comments: wears glasses   Perception     Praxis      Cognition   Behavior During Therapy: WFL for tasks assessed/performed Overall Cognitive Status: Impaired/Different from baseline Area of Impairment: Problem solving;Following commands                General Comments: most likely from fatigue and medicine; slow processing; difficulty sequening      Exercises     Shoulder Instructions       General Comments  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     Pertinent Vitals/ Pain       Pain Assessment: Faces Faces Pain Scale: Hurts little more Pain Location: incisional Pain Descriptors / Indicators: Sore Pain Intervention(s): Limited activity within patient's tolerance  Home Living                                          Prior Functioning/Environment  Frequency  Min 2X/week        Progress Toward Goals  OT Goals(current goals can now be found in the care plan section)  Progress towards OT goals: Progressing toward goals  Acute Rehab OT Goals Patient Stated Goal: get better OT Goal Formulation: With patient Time For Goal Achievement: 03/08/17 Potential to Achieve Goals: Good ADL Goals Pt Will Perform Grooming: with supervision;standing Pt Will Perform Upper Body Bathing: with set-up;with supervision;sitting Pt Will Perform Lower Body Bathing: with min guard assist;sit to/from stand Pt Will Perform Upper Body Dressing: with supervision;with set-up;sitting Pt Will Perform Lower Body Dressing: with min guard assist;sit to/from stand;with adaptive equipment Pt Will Transfer to Toilet: with  min guard assist;ambulating;regular height toilet;bedside commode;grab bars Pt Will Perform Toileting - Clothing Manipulation and hygiene: with min guard assist;sit to/from stand  Plan Discharge plan remains appropriate    Co-evaluation                 End of Session Equipment Utilized During Treatment: Rolling walker;Oxygen (2L)  OT Visit Diagnosis: Unsteadiness on feet (R26.81)   Activity Tolerance Patient limited by fatigue   Patient Left in bed;with call bell/phone within reach;Other (comment) (TEDS applied)   Nurse Communication Mobility status        Time: [id number] OT Time Calculation (min): 15 min  Charges: OT General Charges $OT Visit: 1 Procedure OT Treatments $Self Care/Home Management : 8-22 mins  [facility], OT/L  [id number] 02/26/2017   Janani Chamber,HILLARY 02/26/2017, 1:12 PM

## 2017-02-26 NOTE — Progress Notes (Signed)
Patient ID: Stephanie Hendricks, female   DOB: Sep 19, 1941, 76 y.o.   MRN: [id number] TCTS DAILY ICU PROGRESS NOTE                   [address]            [place] [address]          [phone]   10 Days Post-Op Procedure(s) (LRB): CORONARY ARTERY BYPASS GRAFTING (CABG) x 3 (LIMA to LAD, SVG SEQUENTIALLY to RAMUS INTERMEDIATE and DISTAL CIRCUMFLEX) WITH ENDOSCOPIC HARVESTING OF RIGHT SAPHENOUS VEIN (N/A) TRANSESOPHAGEAL ECHOCARDIOGRAM W/O CARDIOVERSION  Total Length of Stay:  LOS: 10 days   Subjective: Up in chair , breathing better today  Objective: Vital signs in last 24 hours: Temp:  [97.4 F (36.3 C)-98.8 F (37.1 C)] 97.9 F (36.6 C) (03/06 0700) Pulse Rate:  [55-81] 73 (03/06 0800) Cardiac Rhythm: Normal sinus rhythm (03/06 0800) Resp:  [21-33] 23 (03/06 0800) BP: (105-159)/(42-86) 150/59 (03/06 0800) SpO2:  [88 %-100 %] 95 % (03/06 0800) Weight:  [180 lb 5.4 oz (81.8 kg)] 180 lb 5.4 oz (81.8 kg) (03/06 0500)  Filed Weights   02/24/17 0416 02/25/17 0652 02/26/17 0500  Weight: 185 lb 1.6 oz (84 kg) 182 lb 8 oz (82.8 kg) 180 lb 5.4 oz (81.8 kg)    Weight change: -2 lb 2.6 oz (-0.981 kg)   Hemodynamic parameters for last 24 hours: CVP:  [9 mmHg-17 mmHg] 10 mmHg  Intake/Output from previous day: 03/05 0701 - 03/06 0700 In: 395 [I.V.:130; IV Piggyback:265] Out: 2815 [Urine:2815]  Intake/Output this shift: Total I/O In: -  Out: 750 [Urine:750]  Current Meds: Scheduled Meds: . amiodarone  200 mg Oral Daily  . amLODipine  10 mg Oral Daily  . aspirin EC  81 mg Oral Daily  . atorvastatin  40 mg Oral q1800  . Chlorhexidine Gluconate Cloth  6 each Topical Daily  . cloNIDine  0.2 mg Oral BID  . enoxaparin (LOVENOX) injection  30 mg Subcutaneous Q24H  . ferrous Q000111Q C-folic acid  1 capsule Oral BID BM  . furosemide  80 mg Intravenous Q12H  . glipiZIDE  5 mg Oral Q breakfast  . insulin aspart  0-24 Units Subcutaneous TID AC & HS  .  mouth rinse  15 mL Mouth Rinse BID  . pantoprazole  40 mg Oral QAC breakfast  . potassium chloride (KCL MULTIRUN) 30 mEq in 265 mL IVPB  30 mEq Intravenous Once  . sodium chloride flush  10-40 mL Intracatheter Q12H  . sodium chloride flush  3 mL Intravenous Q12H   Continuous Infusions: PRN Meds:.sodium chloride, acetaminophen, guaiFENesin, levalbuterol, ondansetron **OR** ondansetron (ZOFRAN) IV, sodium chloride flush, sodium chloride flush, sodium chloride flush, traMADol  General appearance: alert and cooperative Neurologic: intact Heart: regular rate and rhythm, S1, S2 normal, no murmur, click, rub or gallop Lungs: diminished breath sounds bibasilar Abdomen: soft, non-tender; bowel sounds normal; no masses,  no organomegaly Extremities: extremities normal, atraumatic, no cyanosis or edema and Homans sign is negative, no sign of DVT Wound: sternum intact noe evidence of infection  Lab Results: CBC: Recent Labs  02/25/17 0513 02/26/17 0350  WBC 17.2* 16.3*  HGB 8.7* 8.7*  HCT 27.1* 27.4*  PLT 329 382   BMET:  Recent Labs  02/25/17 0513 02/26/17 0350  NA 145 146*  K 4.2 3.5  CL 110 103  CO2 30 33*  GLUCOSE 130* 83  BUN 43* 36*  CREATININE 1.81* 1.60*  CALCIUM 8.8* 9.0    CMET: Lab Results  Component Value Date   WBC 16.3 (H) 02/26/2017   HGB 8.7 (L) 02/26/2017   HCT 27.4 (L) 02/26/2017   PLT 382 02/26/2017   GLUCOSE 83 02/26/2017   CHOL 70 02/16/2017   TRIG 62 02/16/2017   HDL 22 (L) 02/16/2017   LDLCALC 36 02/16/2017   ALT 11 11/06/2014   AST 13 11/06/2014   NA 146 (H) 02/26/2017   K 3.5 02/26/2017   CL 103 02/26/2017   CREATININE 1.60 (H) 02/26/2017   BUN 36 (H) 02/26/2017   CO2 33 (H) 02/26/2017   TSH 0.785 02/16/2017   INR 1.52 02/16/2017   HGBA1C 6.0 (H) 02/21/2017      PT/INR: No results for input(s): LABPROT, INR in the last 72 hours. Radiology: Dg Chest 2 View  Result Date: 02/25/2017 CLINICAL DATA:  Shortness of breath, status post CABG  on February 16, 2017 EXAM: CHEST  2 VIEW COMPARISON:  PA and lateral chest x-ray of February 23, 2017. FINDINGS: The lungs are hypoinflated. Persistent bibasilar densities are present and on the left are slightly more conspicuous. The right hemidiaphragm remains obscured. The cardiac silhouette is enlarged. The pulmonary vascularity is engorged. The PICC line tip projects over the cavoatrial junction. Surgical wires are intact. Bilateral pleural effusions have increased slightly. IMPRESSION: CHF with pulmonary interstitial edema and bilateral pleural effusions. Bibasilar atelectasis. Overall the appearance of the chest has deteriorated slightly with slight worsening of interstitial edema. Electronically Signed   By: David  Martinique M.D.   On: 02/25/2017 09:45   Chronic Kidney Disease   Stage I     GFR >90  Stage II    GFR 60-89  Stage IIIA GFR 45-59  Stage IIIB GFR 30-44  Stage IV   GFR 15-29  Stage V    GFR  <15  Lab Results  Component Value Date   CREATININE 1.60 (H) 02/26/2017   Estimated Creatinine Clearance: 28.1 mL/min (by C-G formula based on SCr of 1.6 mg/dL (H)).   Assessment/Plan: S/P Procedure(s) (LRB): CORONARY ARTERY BYPASS GRAFTING (CABG) x 3 (LIMA to LAD, SVG SEQUENTIALLY to RAMUS INTERMEDIATE and DISTAL CIRCUMFLEX) WITH ENDOSCOPIC HARVESTING OF RIGHT SAPHENOUS VEIN (N/A) TRANSESOPHAGEAL ECHOCARDIOGRAM W/O CARDIOVERSION Mobilize Diuresis Diabetes control Renal function still with stage IV ckd, but improved from yesterday inspite of increased diuresis  Now sinus in the 70's  cvp 10, BN 908 yesterday, was 176 on 2/24    Grace Isaac 02/26/2017 8:28 AM

## 2017-02-26 NOTE — Plan of Care (Signed)
Problem: Respiratory: Goal: Levels of oxygenation will improve Outcome: Progressing RN decreased patient oxygen from 4L to 2L nasal cannula and pt is tolerating it well with oxygen saturations in the mid 90s

## 2017-02-26 NOTE — Progress Notes (Signed)
Therapy is recommending SNF rehab at this time. Insurance, [facility] Medicare will not approve an inpt rehab admission with SNF recommendations. I will follow at a distance at this time, but currently recommend SNF. [id number]

## 2017-02-26 NOTE — Progress Notes (Signed)
      [address]       [place] [address]             [phone]      Resting comfortably  BP (!) 139/53   Pulse 69   Temp 98 F (36.7 C)   Resp (!) 29   Ht 5\' 1"  (1.549 m)   Wt 180 lb 5.4 oz (81.8 kg)   SpO2 100%   BMI 34.07 kg/m    Intake/Output Summary (Last 24 hours) at 02/26/17 1715 Last data filed at 02/26/17 1400  Gross per 24 hour  Intake              705 ml  Output             3566 ml  Net            -2861 ml   Diuresing well.  CBG well controlled  Remo Lipps C. Roxan Hockey, MD [facility] [phone]

## 2017-02-26 NOTE — Progress Notes (Signed)
Physical Therapy Treatment Patient Details Name: Stephanie Hendricks MRN: [id number] DOB: Jan 30, 1941 Today's Date: 02/26/2017    History of Present Illness Stephanie Hendricks is a very pleasant 76 yo woman with past medical history of sinus bradycardia, CKD, DM, HTN, hyperlipidemia who is coming as a transfer from [facility] for high risk NSTEMI. 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     PT Comments    Pt progressing well toward goals with reduced level of assist required and increased distance of ambulation. Pt with slow processing and decreased sequencing ability today. Pt on 2L O2 during ambulation, but upon stopping ambulation sats down to 78%. Pt recovered to 90% within 3 minutes sitting EOB with max verbal cues for breathing technique. Current d/c recommendation remains appropriate when medically ready. PT will continue to follow.   Follow Up Recommendations  SNF;Supervision/Assistance - 24 hour     Equipment Recommendations  Rolling walker with 5" wheels    Recommendations for Other Services       Precautions / Restrictions Precautions Precautions: Sternal;Fall Precaution Comments: able to recall sternal precautions Restrictions Weight Bearing Restrictions: Yes Other Position/Activity Restrictions: sternal precautions    Mobility  Bed Mobility Overal bed mobility: Needs Assistance Bed Mobility: Sit to Sidelying         Sit to sidelying: Max assist General bed mobility comments: Pt ableto lean to L sidelying. A to lift BLE back onto bed  Transfers Overall transfer level: Needs assistance Equipment used: Rolling walker (2 wheeled) Transfers: Sit to/from [facility] Sit to Stand: Mod assist Stand pivot transfers: Min assist       General transfer comment: requires verbal cues for technique and for safety with sternal precautions; modA for power up from chair and from EOB; stand pivot to [facility]  Ambulation/Gait Ambulation/Gait assistance: +2 safety/equipment;Min  assist;Mod assist Ambulation Distance (Feet): 150 Feet Assistive device: Rolling walker (2 wheeled) Gait Pattern/deviations: Step-through pattern;Decreased step length - right;Decreased step length - left;Decreased stride length;Trunk flexed;Wide base of support Gait velocity: decreased Gait velocity interpretation: Below normal speed for age/gender General Gait Details: initially minA for balance and verbal cues for upright posture and pursed lip breathing technique but progressed to modA due to decrease in cognition and need for assist steering/stabilizing RW   Stairs            Wheelchair Mobility    Modified Rankin (Stroke Patients Only)       Balance Overall balance assessment: Needs assistance Sitting-balance support: Feet supported;No upper extremity supported Sitting balance-Leahy Scale: Fair Sitting balance - Comments: sat EOB x 5 min   Standing balance support: Bilateral upper extremity supported Standing balance-Leahy Scale: Poor Standing balance comment: reliant on RW and minA                    Cognition Arousal/Alertness: Awake/alert Behavior During Therapy: WFL for tasks assessed/performed Overall Cognitive Status: Impaired/Different from baseline Area of Impairment: Problem solving;Following commands       Following Commands: Follows one step commands consistently;Follows one step commands with increased time     Problem Solving: Slow processing;Decreased initiation;Difficulty sequencing;Requires verbal cues;Requires tactile cues General Comments: most likely from fatigue and medicine; slow processing; difficulty sequening    Exercises      General Comments General comments (skin integrity, edema, etc.): transferred to commode but needed a few minutes and asked to be left there      Pertinent Vitals/Pain Pain Assessment: Faces Faces Pain Scale: Hurts little more Pain Location:  incisional Pain Descriptors / Indicators: Sore Pain  Intervention(s): Limited activity within patient's tolerance    Home Living                      Prior Function            PT Goals (current goals can now be found in the care plan section) Acute Rehab PT Goals Patient Stated Goal: get better Progress towards PT goals: Progressing toward goals    Frequency    Min 3X/week      PT Plan Current plan remains appropriate    Co-evaluation             End of Session Equipment Utilized During Treatment: Gait belt;Oxygen Activity Tolerance: Patient tolerated treatment well Patient left: with call bell/phone within reach;Other (comment) (on BSC with OT in room) Nurse Communication: Mobility status PT Visit Diagnosis: Unsteadiness on feet (R26.81);Other abnormalities of gait and mobility (R26.89);Muscle weakness (generalized) (M62.81);Difficulty in walking, not elsewhere classified (R26.2)     Time: [id number] PT Time Calculation (min) (ACUTE ONLY): 24 min  Charges:                       G CodesTracie Hendricks 2017-03-08, 1:18 PM   Stephanie Hendricks, SPT Acute Rehab SPT [phone]

## 2017-02-26 NOTE — Progress Notes (Signed)
Darrick Grinder, NP notified of Co-ox 50. No new orders received.

## 2017-02-27 ENCOUNTER — Inpatient Hospital Stay (HOSPITAL_COMMUNITY): Payer: Medicare Other

## 2017-02-27 DIAGNOSIS — I2510 Atherosclerotic heart disease of native coronary artery without angina pectoris: Secondary | ICD-10-CM

## 2017-02-27 LAB — BASIC METABOLIC PANEL
Anion gap: 9 (ref 5–15)
BUN: 34 mg/dL — ABNORMAL HIGH (ref 6–20)
CO2: 38 mmol/L — ABNORMAL HIGH (ref 22–32)
Calcium: 8.7 mg/dL — ABNORMAL LOW (ref 8.9–10.3)
Chloride: 97 mmol/L — ABNORMAL LOW (ref 101–111)
Creatinine, Ser: 1.49 mg/dL — ABNORMAL HIGH (ref 0.44–1.00)
GFR calc Af Amer: 38 mL/min — ABNORMAL LOW (ref >60)
GFR calc non Af Amer: 32 mL/min — ABNORMAL LOW (ref >60)
Glucose, Bld: 111 mg/dL — ABNORMAL HIGH (ref 65–99)
Potassium: 3.2 mmol/L — ABNORMAL LOW (ref 3.5–5.1)
Sodium: 144 mmol/L (ref 135–145)

## 2017-02-27 LAB — GLUCOSE, CAPILLARY
GLUCOSE-CAPILLARY: 97 mg/dL (ref 65–99)
GLUCOSE-CAPILLARY: 191 mg/dL — AB (ref 65–99)
GLUCOSE-CAPILLARY: 121 mg/dL — AB (ref 65–99)
Glucose-Capillary: 192 mg/dL — ABNORMAL HIGH (ref 65–99)

## 2017-02-27 LAB — CBC
HCT: 26.4 % — ABNORMAL LOW (ref 36.0–46.0)
Hemoglobin: 8.4 g/dL — ABNORMAL LOW (ref 12.0–15.0)
MCH: 25.4 pg — ABNORMAL LOW (ref 26.0–34.0)
MCHC: 31.8 g/dL (ref 30.0–36.0)
MCV: 79.8 fL (ref 78.0–100.0)
Platelets: 409 10*3/uL — ABNORMAL HIGH (ref 150–400)
RBC: 3.31 MIL/uL — ABNORMAL LOW (ref 3.87–5.11)
RDW: 18.7 % — ABNORMAL HIGH (ref 11.5–15.5)
WBC: 17.5 10*3/uL — ABNORMAL HIGH (ref 4.0–10.5)

## 2017-02-27 LAB — BRAIN NATRIURETIC PEPTIDE: B Natriuretic Peptide: 1019.4 pg/mL — ABNORMAL HIGH (ref 0.0–100.0)

## 2017-02-27 LAB — COOXEMETRY PANEL
Carboxyhemoglobin: 1.3 % (ref 0.5–1.5)
Methemoglobin: 0.6 % (ref 0.0–1.5)
O2 Saturation: 57.2 %
Total hemoglobin: 20.7 g/dL — ABNORMAL HIGH (ref 12.0–16.0)

## 2017-02-27 MED ORDER — SODIUM CHLORIDE 0.9 % IV SOLN
30.0000 meq | Freq: Once | INTRAVENOUS | Status: AC
  Administered 2017-02-27: 30 meq via INTRAVENOUS
  Filled 2017-02-27: qty 15

## 2017-02-27 NOTE — Progress Notes (Signed)
Advanced Heart Failure Rounding Note   Subjective:    Yesterday transitioned to po lasix. CVP 9-10. Weight down 6 pounds. Creatinine trending down 1.6>1.4  Denies SOB/CP.   Objective:   Weight Range:  Vital Signs:   Temp:  [98 F (36.7 C)-99.5 F (37.5 C)] 98.6 F (37 C) (03/07 0700) Pulse Rate:  [66-81] 72 (03/07 0500) Resp:  [20-30] 20 (03/07 0500) BP: (117-152)/(46-63) 135/57 (03/07 0500) SpO2:  [89 %-100 %] 92 % (03/07 0500) Weight:  [174 lb 9.7 oz (79.2 kg)] 174 lb 9.7 oz (79.2 kg) (03/07 0500) Last BM Date: 02/26/17  Weight change: Filed Weights   02/25/17 0652 02/26/17 0500 02/27/17 0500  Weight: 182 lb 8 oz (82.8 kg) 180 lb 5.4 oz (81.8 kg) 174 lb 9.7 oz (79.2 kg)    Intake/Output:   Intake/Output Summary (Last 24 hours) at 02/27/17 0804 Last data filed at 02/27/17 0610  Gross per 24 hour  Intake              725 ml  Output             3326 ml  Net            -2601 ml    Physical Exam: CVP 9-10 General:  Elderly in the chair. No resp difficulty HEENT: normal Neck: supple. JVP 9-10 . Carotids 2+ bilat; no bruits. No lymphadenopathy or thryomegaly appreciated. Cor: PMI nondisplaced. Regular rate & rhythm. No rubs, gallops or murmurs. Sternal incision approximated. No exudate.  Lungs: Decreased in the bases on  2 liters.  Abdomen: soft, nontender, nondistended. No hepatosplenomegaly. No bruits or masses. Good bowel sounds. Extremities: no cyanosis, clubbing, rash. Warm R and LLE  1+ edema.  Neuro: alert & orientedx3, cranial nerves grossly intact. moves all 4 extremities w/o difficulty. Affect pleasant GU: Foley   Telemetry: A V Paced 70s  Labs: Basic Metabolic Panel:  Recent Labs Lab 02/23/17 0423 02/24/17 0407 02/25/17 0513 02/26/17 0350 02/27/17 0430  NA 143 143 145 146* 144  K 4.4 5.0 4.2 3.5 3.2*  CL 106 107 110 103 97*  CO2 29 29 30  33* 38*  GLUCOSE 136* 142* 130* 83 111*  BUN 35* 45* 43* 36* 34*  CREATININE 1.77* 1.87* 1.81* 1.60*  1.49*  CALCIUM 8.7* 9.0 8.8* 9.0 8.7*    Liver Function Tests: No results for input(s): AST, ALT, ALKPHOS, BILITOT, PROT, ALBUMIN in the last 168 hours. No results for input(s): LIPASE, AMYLASE in the last 168 hours. No results for input(s): AMMONIA in the last 168 hours.  CBC:  Recent Labs Lab 02/23/17 0423 02/24/17 0407 02/25/17 0513 02/26/17 0350 02/27/17 0430  WBC 14.5* 17.1* 17.2* 16.3* 17.5*  HGB 7.9* 7.9* 8.7* 8.7* 8.4*  HCT 24.9* 25.1* 27.1* 27.4* 26.4*  MCV 78.3 78.0 79.7 80.1 79.8  PLT 222 317 329 382 409*    Cardiac Enzymes: No results for input(s): CKTOTAL, CKMB, CKMBINDEX, TROPONINI in the last 168 hours.  BNP: BNP (last 3 results)  Recent Labs  02/16/17 0242 02/25/17 1128 02/27/17 0430  BNP 176.0* 908.6* 1,019.4*    ProBNP (last 3 results) No results for input(s): PROBNP in the last 8760 hours.    Other results:  Imaging: Dg Chest 2 View  Result Date: 02/25/2017 CLINICAL DATA:  Shortness of breath, status post CABG on February 16, 2017 EXAM: CHEST  2 VIEW COMPARISON:  PA and lateral chest x-ray of February 23, 2017. FINDINGS: The lungs are hypoinflated. Persistent bibasilar densities  are present and on the left are slightly more conspicuous. The right hemidiaphragm remains obscured. The cardiac silhouette is enlarged. The pulmonary vascularity is engorged. The PICC line tip projects over the cavoatrial junction. Surgical wires are intact. Bilateral pleural effusions have increased slightly. IMPRESSION: CHF with pulmonary interstitial edema and bilateral pleural effusions. Bibasilar atelectasis. Overall the appearance of the chest has deteriorated slightly with slight worsening of interstitial edema. Electronically Signed   By: David  Martinique M.D.   On: 02/25/2017 09:45   Dg Chest Port 1 View  Result Date: 02/27/2017 CLINICAL DATA:  Status post CABG on February 16, 2017 EXAM: PORTABLE CHEST 1 VIEW COMPARISON:  PA and lateral chest x-ray of February 25, 2017  FINDINGS: The lungs are slightly better inflated today. Bibasilar densities. Bilateral pleural effusions greatest on the right are present and stable. The cardiac silhouette is mildly enlarged. The left heart border remains largely obscured. The central pulmonary vascularity is prominent but less engorged. There is calcification in the wall of the aortic arch. The sternal wires are intact. The right sided PICC line tip projects over the cavoatrial junction. IMPRESSION: Interval decrease in pulmonary interstitial edema consistent with improving CHF. Bibasilar atelectasis and pleural effusions are fairly stable. Thoracic aortic atherosclerosis. Electronically Signed   By: David  Martinique M.D.   On: 02/27/2017 07:33     Medications:     Scheduled Medications: . amiodarone  200 mg Oral Daily  . amLODipine  10 mg Oral Daily  . aspirin EC  81 mg Oral Daily  . atorvastatin  40 mg Oral q1800  . Chlorhexidine Gluconate Cloth  6 each Topical Daily  . cloNIDine  0.2 mg Oral BID  . enoxaparin (LOVENOX) injection  30 mg Subcutaneous Q24H  . ferrous [id number] C-folic acid  1 capsule Oral BID BM  . furosemide  40 mg Oral BID  . glipiZIDE  5 mg Oral Q breakfast  . insulin aspart  0-24 Units Subcutaneous TID AC & HS  . mouth rinse  15 mL Mouth Rinse BID  . pantoprazole  40 mg Oral QAC breakfast  . potassium chloride (KCL MULTIRUN) 30 mEq in 265 mL IVPB  30 mEq Intravenous Once  . sodium chloride flush  10-40 mL Intracatheter Q12H  . sodium chloride flush  3 mL Intravenous Q12H    Infusions:   PRN Medications: sodium chloride, acetaminophen, guaiFENesin, levalbuterol, ondansetron **OR** ondansetron (ZOFRAN) IV, sodium chloride flush, sodium chloride flush, sodium chloride flush, traMADol   Assessment/Plan/Discussion   1. CAD--->NSTEMI--> S/P CABG x3 on 2/24. Off all drips. Pain controlled.  2. A/C Diastolic Heart Failure - ECHO 02/21/17 EF 65-70% Grade IDD CXR 3/5 with increased  interstitial edema.   CO-OX was stable 60% so inotropes needed.  Todays CO-OX is 57%.  CVP 9-10. Continue lasix 40 mg po twice a day.  Renal function continues to improved. Continue ted hose.  3. A fib- 2/28 with amio drip. Transitioned to po amio 200 mg twice a day on 3/1. She had brief post-op atrial fibrillation, remains in NSR.  If no further atrial fibrillation, would stop amiodarone at 30 days and hold off on warfarin. 4. HTN- Stable Continue clonidine 0.2 mg twice a day + amlodipine 10 mg daily.   5. Elevated WBC- WBC 17.2>16.3> in am.  Urine CX pending. Sternal incision approximated. No evidence of infection. Needs aggressive pulmonary toilet.  6. Acute Respiratory Failure - Improved. Continue 2  Liters [place].   7. AKI on CKD- Creatinine trending down. Marland Kitchen  4>1.8>1.6>1.4 .Follow closely.   8. DMII- on glipizide . On sliding scale.  9. Severe deconditioning- PT rececommending SNF.  10. Sinus bradycardia - resolved off metoprolol.  Length of Stay: [place] NP-C  02/27/2017, 8:04 AM  Advanced Heart Failure Team Pager [phone] (M-F; 7a - 4p)  Please contact [facility] for night-coverage after hours (4p -7a ) and weekends on [email or web address]  Patient seen with NP, agree with the above note.  She is on po Lasix now, doing well.  BP ok. No changes today, to telemetry.   Loralie Champagne 02/27/2017 8:47 AM

## 2017-02-27 NOTE — Progress Notes (Signed)
Occupational Therapy Treatment Patient Details Name: Stephanie Hendricks MRN: [id number] DOB: 10-03-1941 Today's Date: 02/27/2017    History of present illness Stephanie Hendricks is a very pleasant 76 yo woman with past medical history of sinus bradycardia, CKD, DM, HTN, hyperlipidemia who is coming as a transfer from [facility] for high risk NSTEMI. 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    OT comments  Pt able to perform stand pivot transfer to [facility] with min assist today; limited ambulation to bathroom due to urgency. Pt required max assist for peri care and set up for grooming tasks in sitting. Pt able to recall 2/3 sternal precautions at start of session; reviewed all precautions with ADL. D/c plan remains appropriate. Will continue to follow acutely.   Follow Up Recommendations  CIR;Supervision/Assistance - 24 hour    Equipment Recommendations  3 in 1 bedside commode    Recommendations for Other Services      Precautions / Restrictions Precautions Precautions: Sternal;Fall Precaution Comments: Able to recall 2/5 sternal precautions; reviewed all precautions with pt. Restrictions Weight Bearing Restrictions: Yes (sternal precautions) Other Position/Activity Restrictions: sternal precautions       Mobility Bed Mobility               General bed mobility comments: Pt OOB in chair upon arrival.  Transfers Overall transfer level: Needs assistance Equipment used: 1 person hand held assist Transfers: Sit to/from Stand Sit to Stand: Min assist Stand pivot transfers: Min assist       General transfer comment: Cues throughout for sequencing and safety. Min assist to boost up from chair x1, BSC x1. And steadying assist for stand pivot x2.    Balance Overall balance assessment: Needs assistance Sitting-balance support: Feet supported;No upper extremity supported Sitting balance-Leahy Scale: Good Sitting balance - Comments: sat edge of chair x 2 min during MD assessment    Standing  balance support: Single extremity supported Standing balance-Leahy Scale: Fair Standing balance comment: Min hand held assist for transfer to [facility]                   ADL Overall ADL's : Needs assistance/impaired     Grooming: Set up;Sitting;Wash/dry hands                   Toilet Transfer: Minimal assistance;Stand-pivot;BSC   Toileting- Clothing Manipulation and Hygiene: Maximal assistance;Sit to/from stand       Functional mobility during ADLs: Minimal assistance (for stand pivot) General ADL Comments: Cues for precautions during transfers      Vision                     Perception     Praxis      Cognition   Behavior During Therapy: [facility] for tasks assessed/performed;Flat affect Overall Cognitive Status: Impaired/Different from baseline Area of Impairment: Following commands;Problem solving        Following Commands: Follows one step commands consistently;Follows one step commands with increased time     Problem Solving: Slow processing;Decreased initiation;Difficulty sequencing;Requires verbal cues General Comments: slow processing and difficulty sequencing requiring verbal cues during transfer to [facility] and ambulation      Exercises     Shoulder Instructions       General Comments      Pertinent Vitals/ Pain       Pain Assessment: No/denies pain  Home Living  Prior Functioning/Environment              Frequency  Min 2X/week        Progress Toward Goals  OT Goals(current goals can now be found in the care plan section)  Progress towards OT goals: Progressing toward goals  Acute Rehab OT Goals Patient Stated Goal: get better OT Goal Formulation: With patient  Plan Discharge plan remains appropriate    Co-evaluation                 End of Session Equipment Utilized During Treatment: Oxygen  OT Visit Diagnosis: Unsteadiness on feet (R26.81)   Activity  Tolerance Patient tolerated treatment well   Patient Left in chair;with call bell/phone within reach   Nurse Communication Mobility status;Other (comment) (pt had BM)        Time: 6770-3403 OT Time Calculation (min): 14 min  Charges: OT General Charges $OT Visit: 1 Procedure OT Treatments $Self Care/Home Management : 8-22 mins  Natividad Schlosser A. Ulice Brilliant, M.S., OTR/L Pager: [place] 02/27/2017, 2:18 PM

## 2017-02-27 NOTE — Progress Notes (Signed)
Physical Therapy Treatment Patient Details Name: Stephanie Hendricks MRN: [id number] DOB: Oct 22, 1941 Today's Date: 02/27/2017    History of Present Illness Stephanie Hendricks is a very pleasant 76 yo woman with past medical history of sinus bradycardia, CKD, DM, HTN, hyperlipidemia who is coming as a transfer from [facility] for high risk NSTEMI. 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     PT Comments    Pt progressing well toward goals with increase in ambulation distance. Pt with continued slow processing and sequencing. Current d/c recommendation remains appropriate when medically ready. PT will continue to follow.    Follow Up Recommendations  SNF;Supervision/Assistance - 24 hour     Equipment Recommendations  Rolling walker with 5" wheels    Recommendations for Other Services       Precautions / Restrictions Precautions Precautions: Sternal;Fall Precaution Comments: able to recall sternal precautions Restrictions Weight Bearing Restrictions: Yes Other Position/Activity Restrictions: sternal precautions    Mobility  Bed Mobility               General bed mobility comments: seated in chair upon arrival  Transfers Overall transfer level: Needs assistance Equipment used: Rolling walker (2 wheeled) Transfers: Sit to/from [employer] Transfers Sit to Stand: Mod assist Stand pivot transfers: Mod assist       General transfer comment: requires verbal cues for safety with sternal precautions, modA to power up for stand x 2, stand pivot to [facility] with verbal cues for steps toward commode   Ambulation/Gait Ambulation/Gait assistance: Min assist;+2 safety/equipment Ambulation Distance (Feet): 300 Feet Assistive device: Rolling walker (2 wheeled) Gait Pattern/deviations: Step-through pattern;Decreased step length - right;Decreased step length - left;Decreased stride length Gait velocity: decreased Gait velocity interpretation: Below normal speed for age/gender General Gait Details:  chair follow for safety, verbal cues for upright posture and walker steering, sats >90% throughout amb   Stairs            Wheelchair Mobility    Modified Rankin (Stroke Patients Only)       Balance Overall balance assessment: Needs assistance Sitting-balance support: No upper extremity supported;Feet supported Sitting balance-Leahy Scale: Fair Sitting balance - Comments: sat edge of chair x 2 min during MD assessment    Standing balance support: Bilateral upper extremity supported Standing balance-Leahy Scale: Poor Standing balance comment: reliant on RW and minA                    Cognition Arousal/Alertness: Awake/alert Behavior During Therapy: WFL for tasks assessed/performed Overall Cognitive Status: Impaired/Different from baseline Area of Impairment: Following commands;Problem solving       Following Commands: Follows one step commands consistently;Follows one step commands with increased time     Problem Solving: Slow processing;Difficulty sequencing;Requires verbal cues;Requires tactile cues General Comments: slow processing and difficulty sequencing requiring verbal cues during transfer to [facility] and ambulation    Exercises      General Comments General comments (skin integrity, edema, etc.): std pvt to [facility] for BM, dependent for hygeine      Pertinent Vitals/Pain Pain Assessment: No/denies pain    Home Living                      Prior Function            PT Goals (current goals can now be found in the care plan section) Acute Rehab PT Goals Patient Stated Goal: get better Progress towards PT goals: Progressing toward goals    Frequency  Min 3X/week      PT Plan Current plan remains appropriate    Co-evaluation             End of Session Equipment Utilized During Treatment: Gait belt;Oxygen (2L O2) Activity Tolerance: Patient tolerated treatment well Patient left: in chair;with call bell/phone within  reach Nurse Communication: Mobility status PT Visit Diagnosis: Unsteadiness on feet (R26.81);Other abnormalities of gait and mobility (R26.89);Muscle weakness (generalized) (M62.81);Difficulty in walking, not elsewhere classified (R26.2)     Time: 5615-3794 PT Time Calculation (min) (ACUTE ONLY): 39 min  Charges:  $Gait Training: 23-37 mins $Therapeutic Activity: 8-22 mins                    G Codes:       Tracie Harrier 2017-03-04, 12:33 PM   Tracie Harrier, SPT Acute Rehab SPT [phone]

## 2017-02-27 NOTE — Progress Notes (Addendum)
TCTS DAILY ICU PROGRESS NOTE                   [address]            [place] [address]          [phone]   11 Days Post-Op Procedure(s) (LRB): CORONARY ARTERY BYPASS GRAFTING (CABG) x 3 (LIMA to LAD, SVG SEQUENTIALLY to RAMUS INTERMEDIATE and DISTAL CIRCUMFLEX) WITH ENDOSCOPIC HARVESTING OF RIGHT SAPHENOUS VEIN (N/A) TRANSESOPHAGEAL ECHOCARDIOGRAM W/O CARDIOVERSION  Total Length of Stay:  LOS: 11 days   Subjective: Patient sitting in chair and is visiting with nephew.  Objective: Vital signs in last 24 hours: Temp:  [98 F (36.7 C)-99.5 F (37.5 C)] 98.6 F (37 C) (03/07 0700) Pulse Rate:  [66-81] 72 (03/07 0500) Cardiac Rhythm: Normal sinus rhythm (03/06 2000) Resp:  [20-30] 20 (03/07 0500) BP: (117-152)/(46-63) 135/57 (03/07 0500) SpO2:  [89 %-100 %] 92 % (03/07 0500) Weight:  [174 lb 9.7 oz (79.2 kg)] 174 lb 9.7 oz (79.2 kg) (03/07 0500)  Filed Weights   02/25/17 0652 02/26/17 0500 02/27/17 0500  Weight: 182 lb 8 oz (82.8 kg) 180 lb 5.4 oz (81.8 kg) 174 lb 9.7 oz (79.2 kg)    Weight change: -5 lb 11.7 oz (-2.6 kg)   CVP:  [3 mmHg-14 mmHg] 3 mmHg  Intake/Output from previous day: 03/06 0701 - 03/07 0700 In: 240 [P.O.:240; I.V.:230; IV Piggyback:265] Out: 9735 [Urine:4075; Stool:1]  Intake/Output this shift: No intake/output data recorded.  Current Meds: Scheduled Meds: . amiodarone  200 mg Oral Daily  . amLODipine  10 mg Oral Daily  . aspirin EC  81 mg Oral Daily  . atorvastatin  40 mg Oral q1800  . Chlorhexidine Gluconate Cloth  6 each Topical Daily  . cloNIDine  0.2 mg Oral BID  . enoxaparin (LOVENOX) injection  30 mg Subcutaneous Q24H  . ferrous [id number] C-folic acid  1 capsule Oral BID BM  . furosemide  40 mg Oral BID  . glipiZIDE  5 mg Oral Q breakfast  . insulin aspart  0-24 Units Subcutaneous TID AC & HS  . mouth rinse  15 mL Mouth Rinse BID  . pantoprazole  40 mg Oral QAC breakfast  . potassium chloride (KCL  MULTIRUN) 30 mEq in 265 mL IVPB  30 mEq Intravenous Once  . sodium chloride flush  10-40 mL Intracatheter Q12H  . sodium chloride flush  3 mL Intravenous Q12H   Continuous Infusions:  PRN Meds:.sodium chloride, acetaminophen, guaiFENesin, levalbuterol, ondansetron **OR** ondansetron (ZOFRAN) IV, sodium chloride flush, sodium chloride flush, sodium chloride flush, traMADol  General appearance: alert, cooperative and no distress Neurologic: intact Heart: RRR Lungs: Slightly diminished at bases Extremities: Bilateral LE edema Wound:Sternal and RLE wounds are clean and dry  Lab Results: CBC:  Recent Labs  02/26/17 0350 02/27/17 0430  WBC 16.3* 17.5*  HGB 8.7* 8.4*  HCT 27.4* 26.4*  PLT 382 409*   BMET:   Recent Labs  02/26/17 0350 02/27/17 0430  NA 146* 144  K 3.5 3.2*  CL 103 97*  CO2 33* 38*  GLUCOSE 83 111*  BUN 36* 34*  CREATININE 1.60* 1.49*  CALCIUM 9.0 8.7*    CMET: Lab Results  Component Value Date   WBC 17.5 (H) 02/27/2017   HGB 8.4 (L) 02/27/2017   HCT 26.4 (L) 02/27/2017   PLT 409 (H) 02/27/2017   GLUCOSE 111 (H) 02/27/2017   CHOL 70 02/16/2017   TRIG 62  02/16/2017   HDL 22 (L) 02/16/2017   LDLCALC 36 02/16/2017   ALT 11 11/06/2014   AST 13 11/06/2014   NA 144 02/27/2017   K 3.2 (L) 02/27/2017   CL 97 (L) 02/27/2017   CREATININE 1.49 (H) 02/27/2017   BUN 34 (H) 02/27/2017   CO2 38 (H) 02/27/2017   TSH 0.785 02/16/2017   INR 1.52 02/16/2017   HGBA1C 6.0 (H) 02/21/2017      PT/INR: No results for input(s): LABPROT, INR in the last 72 hours. Radiology: Dg Chest Port 1 View  Result Date: 02/27/2017 CLINICAL DATA:  Status post CABG on February 16, 2017 EXAM: PORTABLE CHEST 1 VIEW COMPARISON:  PA and lateral chest x-ray of February 25, 2017 FINDINGS: The lungs are slightly better inflated today. Bibasilar densities. Bilateral pleural effusions greatest on the right are present and stable. The cardiac silhouette is mildly enlarged. The left heart  border remains largely obscured. The central pulmonary vascularity is prominent but less engorged. There is calcification in the wall of the aortic arch. The sternal wires are intact. The right sided PICC line tip projects over the cavoatrial junction. IMPRESSION: Interval decrease in pulmonary interstitial edema consistent with improving CHF. Bibasilar atelectasis and pleural effusions are fairly stable. Thoracic aortic atherosclerosis. Electronically Signed   By: David  Martinique M.D.   On: 02/27/2017 07:33    Assessment/Plan: S/P Procedure(s) (LRB): CORONARY ARTERY BYPASS GRAFTING (CABG) x 3 (LIMA to LAD, SVG SEQUENTIALLY to RAMUS INTERMEDIATE and DISTAL CIRCUMFLEX) WITH ENDOSCOPIC HARVESTING OF RIGHT SAPHENOUS VEIN (N/A) TRANSESOPHAGEAL ECHOCARDIOGRAM W/O CARDIOVERSION  1.CV-Previous a  fib with RVR. On Clonidine 0.2 mg bid, Amlodipine 10 mg daily, and Amiodarone 200 mg daily. Co ox 57.2 this am. 2. Pulmonary-On 2 liters of oxygen via [place]. Will wean as able. CXR this am shows decrease in pulmonary edema, bibasilar atelectasis, and effusions.Encourage incentive spirometer and flutter valve. 3. Acute on chronic diastolic heart faiure-CVP 9-10.On Lasix 40 mg bid. Heart failure team following. 4. ABL anemia-H and H this am stable at 8.4 and 26.4. Continue Trinsicon. 5. Creatinine stable at 1.49. 6. DM-CBGs 131/176/97. On Insulin and Glipizide. HGA1C 6. 7. Hope to transfer to [facility] soon  Stephanie Hendricks 02/27/2017 8:26 AM   Holding sinus Volume status improved  Walked just under 300 feet today To stepdown I have seen and examined Reece Agar and agree with the above assessment  and plan.  Grace Isaac MD Beeper [phone] Office [phone] 02/27/2017 9:23 AM

## 2017-02-28 ENCOUNTER — Inpatient Hospital Stay (HOSPITAL_COMMUNITY): Payer: Medicare Other

## 2017-02-28 LAB — GLUCOSE, CAPILLARY
GLUCOSE-CAPILLARY: 221 mg/dL — AB (ref 65–99)
GLUCOSE-CAPILLARY: 171 mg/dL — AB (ref 65–99)
Glucose-Capillary: 75 mg/dL (ref 65–99)
Glucose-Capillary: 164 mg/dL — ABNORMAL HIGH (ref 65–99)

## 2017-02-28 LAB — BASIC METABOLIC PANEL
Anion gap: 9 (ref 5–15)
BUN: 27 mg/dL — ABNORMAL HIGH (ref 6–20)
CO2: 38 mmol/L — ABNORMAL HIGH (ref 22–32)
Calcium: 8.6 mg/dL — ABNORMAL LOW (ref 8.9–10.3)
Chloride: 97 mmol/L — ABNORMAL LOW (ref 101–111)
Creatinine, Ser: 1.41 mg/dL — ABNORMAL HIGH (ref 0.44–1.00)
GFR calc Af Amer: 40 mL/min — ABNORMAL LOW (ref >60)
GFR calc non Af Amer: 35 mL/min — ABNORMAL LOW (ref >60)
Glucose, Bld: 90 mg/dL (ref 65–99)
Potassium: 3.1 mmol/L — ABNORMAL LOW (ref 3.5–5.1)
Sodium: 144 mmol/L (ref 135–145)

## 2017-02-28 MED ORDER — POTASSIUM CHLORIDE CRYS ER 20 MEQ PO TBCR
40.0000 meq | EXTENDED_RELEASE_TABLET | Freq: Two times a day (BID) | ORAL | Status: AC
  Administered 2017-02-28 (×2): 40 meq via ORAL
  Filled 2017-02-28 (×2): qty 2

## 2017-02-28 MED ORDER — POTASSIUM CHLORIDE CRYS ER 20 MEQ PO TBCR
40.0000 meq | EXTENDED_RELEASE_TABLET | Freq: Every day | ORAL | Status: DC
  Administered 2017-03-01 – 2017-03-02 (×2): 40 meq via ORAL
  Filled 2017-02-28 (×2): qty 2

## 2017-02-28 MED ORDER — ENOXAPARIN SODIUM 30 MG/0.3ML ~~LOC~~ SOLN
30.0000 mg | SUBCUTANEOUS | Status: DC
  Administered 2017-02-28 – 2017-03-01 (×2): 30 mg via SUBCUTANEOUS
  Filled 2017-02-28 (×2): qty 0.3

## 2017-02-28 NOTE — Progress Notes (Addendum)
      [address]       [place] [address]             [phone]        12 Days Post-Op Procedure(s) (LRB): CORONARY ARTERY BYPASS GRAFTING (CABG) x 3 (LIMA to LAD, SVG SEQUENTIALLY to RAMUS INTERMEDIATE and DISTAL CIRCUMFLEX) WITH ENDOSCOPIC HARVESTING OF RIGHT SAPHENOUS VEIN (N/A) TRANSESOPHAGEAL ECHOCARDIOGRAM W/O CARDIOVERSION  Subjective: Patient states her breathing is fine this am.  Objective: Vital signs in last 24 hours: Temp:  [98.2 F (36.8 C)-99.4 F (37.4 C)] 99 F (37.2 C) (03/08 0500) Pulse Rate:  [66-80] 78 (03/08 0500) Cardiac Rhythm: Normal sinus rhythm (03/07 2000) Resp:  [17-26] 18 (03/08 0500) BP: (128-162)/(49-63) 146/63 (03/08 0500) SpO2:  [94 %-100 %] 97 % (03/08 0500) Weight:  [173 lb 8 oz (78.7 kg)] 173 lb 8 oz (78.7 kg) (03/08 0121)  Pre op weight 81 kg Current Weight  02/28/17 173 lb 8 oz (78.7 kg)      Intake/Output from previous day: 03/07 0701 - 03/08 0700 In: 443 [P.O.:360; I.V.:83] Out: 1025 [Urine:1025]   Physical Exam:  Cardiovascular: RRR, systolic murmur Pulmonary: Slightly diminished at bases Abdomen: Soft, non tender, bowel sounds present. Extremities: Trace bilateral lower extremity edema. Wounds: Clean and dry.  No erythema or signs of infection.  Lab Results: CBC:  Recent Labs  02/26/17 0350 02/27/17 0430  WBC 16.3* 17.5*  HGB 8.7* 8.4*  HCT 27.4* 26.4*  PLT 382 409*   BMET:   Recent Labs  02/27/17 0430 02/28/17 0100  NA 144 144  K 3.2* 3.1*  CL 97* 97*  CO2 38* 38*  GLUCOSE 111* 90  BUN 34* 27*  CREATININE 1.49* 1.41*  CALCIUM 8.7* 8.6*    PT/INR:  Lab Results  Component Value Date   INR 1.52 02/16/2017   INR 0.94 02/16/2017   ABG:  INR: Will add last result for INR, ABG once components are confirmed Will add last 4 CBG results once components are confirmed  Assessment/Plan:  1.CV-Previous a  fib with RVR. Maintaining SR in the 70's. On Clonidine 0.2 mg bid,  Amlodipine 10 mg daily, and Amiodarone 200 mg daily.  2. Pulmonary-On 2 liters of oxygen via [place]. Will wean as able. Await CXR this am. Encourage incentive spirometer and flutter valve. 3. Acute on chronic diastolic heart failure-On Lasix 40 mg bid. Heart failure team following. 4. ABL anemia-Last H and H stable at 8.4 and 26.4. Continue Trinsicon. 5. Creatinine stable at 1.49. 6. DM-CBGs 191/121/75. On Insulin and Glipizide. HGA1C 6. 7. Supplement potassium 8. Remove EPW 9. Will need rehab once ready for discharge  ZIMMERMAN,DONIELLE MPA-C 02/28/2017,7:22 AM   Improved , reconsider cir to gain strengths and be able to be d/c home  Wbc still elevated but with out evidence of infection I have seen and examined Reece Agar and agree with the above assessment  and plan.  Grace Isaac MD Beeper [phone] Office [phone] 02/28/2017 8:37 AM

## 2017-02-28 NOTE — Care Management Important Message (Signed)
Important Message  Patient Details  Name: Stephanie Hendricks MRN: [id number] Date of Birth: 03/19/1941   Medicare Important Message Given:  Yes    Nathen May 02/28/2017, 3:18 PM

## 2017-02-28 NOTE — Progress Notes (Signed)
I met with pt at bedside to discuss her rehab options. She prefers SNF rehab close to home and where [facility] will cover her rehab stay. Therefore, pt not a candidate for CIR admission. I have contacted Caryl Pina, SW to request SNF rehab for pt. We will sigh off again. [phone]

## 2017-02-28 NOTE — Progress Notes (Signed)
CARDIAC REHAB PHASE I   PRE:  Rate/Rhythm: 72 SR  BP:  Supine:   Sitting: 152/61  Standing:    SaO2: 83 RA 93 3L  MODE:  Ambulation:  350 ft   POST:  Rate/Rhythm: 78 SR  BP:  Supine:   Sitting: 162/63  Standing:    SaO2: 93 3L 1340-1420 On arrival pt in chair with O2 off room air sat 83%. O2 reapplied at 3L sat 93%. Assisted X1 used O2 3L and walker to ambulate. Gait steady with walker. Pt c/o of feet pain with walking. BP after walk 162/63. Assisted pt to bathroom then to bed to have pacing wires discontinued.Call light in reach and family present.  Rodney Langton RN 02/28/2017 2:16 PM

## 2017-02-28 NOTE — Consult Note (Signed)
            [facility] CM Primary Care Navigator  02/28/2017  Stephanie Hendricks [birth year] [id number]   Went to see patient at the bedside to identify possible discharge needs. Patient reports having "steady hurting on my chest" that had led to this admission.  Patient endorses Dr. Allyn Kenner with Delphina Cahill MD office as the primary care provider.     Patient shared using [employer] in [place] to obtain medications without any problem.   Patient mentioned managing her own medications at home using "pill box" system weekly.  Patient's son Stephanie Hendricks) provides transportation to her doctors' appointments.  She states that son lives with her. Son and girlfriend (Stephanie Hendricks) and granddaughter Stephanie Hendricks) will assist her with care at home.    Anticipated discharge plan is skilled nursing facility close to home (prefers [facility]) according to patient.  Patient voiced understanding to call primary care provider's office when she returns back home, for a post discharge follow-up appointment within a week or sooner if needed. Patient letter (with PCP's contact number) was provided as a reminder.  Explained to patient about [facility] CM services available for health management but she was undecided and states will talk to her doctors on follow-up visit about it.  Encouraged patient to get referral to [facility] care management services if deemed appropriate for it.  [facility] care management contact information provided for future needs that may arise.     For additional questions please contact:  Stephanie Hendricks, BSN, RN-BC [facility] PRIMARY CARE Navigator Cell: [phone]

## 2017-02-28 NOTE — Progress Notes (Signed)
Attempted to pull wires x3. Pt. Has family that is visiting. Spoke with Tacy Dura PA, who instructed pull wires in A.M.

## 2017-02-28 NOTE — Progress Notes (Signed)
Advanced Heart Failure Rounding Note   Subjective:    Transitioned to po lasix 02/26/17.   Creatinine stable at 1.41. Out 500 cc on po lasix. Weight down 1 lb.   Feeling Ok this morning. Slightly sore. Denies SOB.   Objective:   Weight Range:  Vital Signs:   Temp:  [98.2 F (36.8 C)-99.4 F (37.4 C)] 99 F (37.2 C) (03/08 0500) Pulse Rate:  [66-80] 78 (03/08 0500) Resp:  [17-26] 18 (03/08 0500) BP: (128-162)/(49-63) 146/63 (03/08 0500) SpO2:  [94 %-100 %] 97 % (03/08 0500) Weight:  [173 lb 8 oz (78.7 kg)] 173 lb 8 oz (78.7 kg) (03/08 0121) Last BM Date: 02/27/17  Weight change: Filed Weights   02/26/17 0500 02/27/17 0500 02/28/17 0121  Weight: 180 lb 5.4 oz (81.8 kg) 174 lb 9.7 oz (79.2 kg) 173 lb 8 oz (78.7 kg)    Intake/Output:   Intake/Output Summary (Last 24 hours) at 02/28/17 0750 Last data filed at 02/28/17 0122  Gross per 24 hour  Intake              443 ml  Output             1025 ml  Net             -582 ml    Physical Exam: General: Lying in bed. NAD.  HEENT: Normal Neck: supple. JVP ~8-9 cm. Carotids 2+ bilat; no bruits. No thyromegaly or nodule noted.  Cor: PMI nondisplaced. RRR. No M/G/R noted. Sternal incision approximated. No exudate.  Lungs: Slightly diminished basilar sounds. On 2 L 02.  Abdomen: soft, NT, ND, no HSM. No bruits or masses. +BS  Extremities: no cyanosis, clubbing, rash. Warm R and LLE  1+ edema.  Neuro: alert & orientedx3, cranial nerves grossly intact. moves all 4 extremities w/o difficulty. Affect pleasant GU: Foley   Telemetry: Personally reviewed, AV Paced 70s  Labs: Basic Metabolic Panel:  Recent Labs Lab 02/24/17 0407 02/25/17 0513 02/26/17 0350 02/27/17 0430 02/28/17 0100  NA 143 145 146* 144 144  K 5.0 4.2 3.5 3.2* 3.1*  CL 107 110 103 97* 97*  CO2 29 30 33* 38* 38*  GLUCOSE 142* 130* 83 111* 90  BUN 45* 43* 36* 34* 27*  CREATININE 1.87* 1.81* 1.60* 1.49* 1.41*  CALCIUM 9.0 8.8* 9.0 8.7* 8.6*     Liver Function Tests: No results for input(s): AST, ALT, ALKPHOS, BILITOT, PROT, ALBUMIN in the last 168 hours. No results for input(s): LIPASE, AMYLASE in the last 168 hours. No results for input(s): AMMONIA in the last 168 hours.  CBC:  Recent Labs Lab 02/23/17 0423 02/24/17 0407 02/25/17 0513 02/26/17 0350 02/27/17 0430  WBC 14.5* 17.1* 17.2* 16.3* 17.5*  HGB 7.9* 7.9* 8.7* 8.7* 8.4*  HCT 24.9* 25.1* 27.1* 27.4* 26.4*  MCV 78.3 78.0 79.7 80.1 79.8  PLT 222 317 329 382 409*    Cardiac Enzymes: No results for input(s): CKTOTAL, CKMB, CKMBINDEX, TROPONINI in the last 168 hours.  BNP: BNP (last 3 results)  Recent Labs  02/16/17 0242 02/25/17 1128 02/27/17 0430  BNP 176.0* 908.6* 1,019.4*    ProBNP (last 3 results) No results for input(s): PROBNP in the last 8760 hours.    Other results:  Imaging: Dg Chest Port 1 View  Result Date: 02/27/2017 CLINICAL DATA:  Status post CABG on February 16, 2017 EXAM: PORTABLE CHEST 1 VIEW COMPARISON:  PA and lateral chest x-ray of February 25, 2017 FINDINGS: The lungs are slightly  better inflated today. Bibasilar densities. Bilateral pleural effusions greatest on the right are present and stable. The cardiac silhouette is mildly enlarged. The left heart border remains largely obscured. The central pulmonary vascularity is prominent but less engorged. There is calcification in the wall of the aortic arch. The sternal wires are intact. The right sided PICC line tip projects over the cavoatrial junction. IMPRESSION: Interval decrease in pulmonary interstitial edema consistent with improving CHF. Bibasilar atelectasis and pleural effusions are fairly stable. Thoracic aortic atherosclerosis. Electronically Signed   By: David  Martinique M.D.   On: 02/27/2017 07:33     Medications:     Scheduled Medications: . amiodarone  200 mg Oral Daily  . amLODipine  10 mg Oral Daily  . aspirin EC  81 mg Oral Daily  . atorvastatin  40 mg Oral q1800   . Chlorhexidine Gluconate Cloth  6 each Topical Daily  . cloNIDine  0.2 mg Oral BID  . enoxaparin (LOVENOX) injection  30 mg Subcutaneous Q24H  . ferrous [id number] C-folic acid  1 capsule Oral BID BM  . furosemide  40 mg Oral BID  . glipiZIDE  5 mg Oral Q breakfast  . insulin aspart  0-24 Units Subcutaneous TID AC & HS  . mouth rinse  15 mL Mouth Rinse BID  . pantoprazole  40 mg Oral QAC breakfast  . [START ON 03/01/2017] potassium chloride  40 mEq Oral Daily  . potassium chloride  40 mEq Oral BID  . sodium chloride flush  10-40 mL Intracatheter Q12H  . sodium chloride flush  3 mL Intravenous Q12H    Infusions:   PRN Medications: sodium chloride, acetaminophen, guaiFENesin, levalbuterol, ondansetron **OR** ondansetron (ZOFRAN) IV, sodium chloride flush, sodium chloride flush, sodium chloride flush, traMADol   Assessment/Plan/Discussion   1. CAD--->NSTEMI--> S/P CABG x3 on 2/24. Off all drips. Pain controlled.  2. A/C Diastolic Heart Failure - ECHO 02/21/17 EF 65-70% Grade IDD CXR 3/5 with increased interstitial edema.  - Yesterdays Coox OK.  Not on inotropes. - Continue lasix 40 mg po BID.   - Creatinine stable.  - Continue ted hose.  3. A fib- 2/28 with amio drip. Transitioned to po amio 200 mg twice a day on 3/1. She had brief post-op atrial fibrillation, remains in NSR.  If no further atrial fibrillation, would stop amiodarone at 30 days and hold off on warfarin. No change to current plan.  4. HTN - Stable on  clonidine 0.2 mg twice a day + amlodipine 10 mg daily.   5. Elevated WBC- WBC 17.2>16.3> 17.5 02/27/17   - Urine CX indeterminate. Sternal incision approximated. No evidence of infection.  - Continue aggressive pulmonary toilet.  Per primary. 6. Acute Respiratory Failure - Improved. Continue 2  Liters [place].   7. AKI on CKD - - Creatinine stable ~ 1.4. Continue to follow.    8. DMII- on glipizide . On sliding scale. No change.  9. Severe deconditioning - OT  recommended CIR.  10. Sinus bradycardia  - Resolved off metoprolol.  Stable from HF perspective.  PT/OT Recommending SNF/CIR.   Length of Stay: [address]  Annamaria Helling  02/28/2017, 7:50 AM  Advanced Heart Failure Team Pager [phone] (M-F; 7a - 4p)  Please contact [facility] for night-coverage after hours (4p -7a ) and weekends on [email or web address]  Patient seen with PA, agree with the above note.  She is stable on the current regimen.  Continue po Lasix.  Continue amiodarone for 30 days then stop  if no more atrial fibrillation.  No warfarin unless atrial fibrillation recurs.   Loralie Champagne 02/28/2017 4:38 PM

## 2017-03-01 LAB — GLUCOSE, CAPILLARY
GLUCOSE-CAPILLARY: 77 mg/dL (ref 65–99)
GLUCOSE-CAPILLARY: 100 mg/dL — AB (ref 65–99)
Glucose-Capillary: 190 mg/dL — ABNORMAL HIGH (ref 65–99)
Glucose-Capillary: 201 mg/dL — ABNORMAL HIGH (ref 65–99)

## 2017-03-01 LAB — BASIC METABOLIC PANEL
Anion gap: 9 (ref 5–15)
BUN: 23 mg/dL — ABNORMAL HIGH (ref 6–20)
CALCIUM: 8.6 mg/dL — AB (ref 8.9–10.3)
CHLORIDE: 100 mmol/L — AB (ref 101–111)
CO2: 37 mmol/L — ABNORMAL HIGH (ref 22–32)
CREATININE: 1.48 mg/dL — AB (ref 0.44–1.00)
GFR calc non Af Amer: 33 mL/min — ABNORMAL LOW (ref >60)
GFR, EST AFRICAN AMERICAN: 38 mL/min — AB (ref >60)
Glucose, Bld: 82 mg/dL (ref 65–99)
Potassium: 3.7 mmol/L (ref 3.5–5.1)
SODIUM: 146 mmol/L — AB (ref 135–145)

## 2017-03-01 LAB — MAGNESIUM: Magnesium: 1.9 mg/dL (ref 1.7–2.4)

## 2017-03-01 MED ORDER — DOCUSATE SODIUM 100 MG PO CAPS
200.0000 mg | ORAL_CAPSULE | Freq: Every day | ORAL | Status: DC
  Administered 2017-03-01 – 2017-03-02 (×2): 200 mg via ORAL
  Filled 2017-03-01 (×2): qty 2

## 2017-03-01 MED ORDER — MAGNESIUM SULFATE 2 GM/50ML IV SOLN
2.0000 g | Freq: Once | INTRAVENOUS | Status: AC
  Administered 2017-03-01: 2 g via INTRAVENOUS
  Filled 2017-03-01: qty 50

## 2017-03-01 MED ORDER — POTASSIUM CHLORIDE CRYS ER 20 MEQ PO TBCR
30.0000 meq | EXTENDED_RELEASE_TABLET | Freq: Once | ORAL | Status: AC
  Administered 2017-03-01: 30 meq via ORAL
  Filled 2017-03-01: qty 1

## 2017-03-01 NOTE — Progress Notes (Signed)
EPW's removed per order and unit protocol. All tips intact, sites unremarkable, Pt tolerated very well.  Pt understands bedrest for one hr with frequent VS checks.  CCMD notified, will monitor closely.

## 2017-03-01 NOTE — Progress Notes (Signed)
CSW still following patients for needs.  Stephanie Hendricks, MSW,  [place]

## 2017-03-01 NOTE — Progress Notes (Signed)
Occupational Therapy Treatment Patient Details Name: Stephanie Hendricks MRN: [id number] DOB: [birth year] Today's Date: 03/01/2017    History of present illness Stephanie Hendricks is a very pleasant 76 yo woman with past medical history of sinus bradycardia, CKD, DM, HTN, hyperlipidemia who is coming as a transfer from [facility] for high risk NSTEMI. 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    OT comments  Pt with improved activity tolerance today; able to stand at sink with min guard assist to complete grooming activities x2. Continues to require min assist for balance with functional mobility. Pt able to recall 2/5 sternal precautions; reviewed all precautions with pt. Updated d/c plan to SNF for follow up. Will continue to follow acutely.   Follow Up Recommendations  SNF;Supervision/Assistance - 24 hour    Equipment Recommendations  3 in 1 bedside commode    Recommendations for Other Services      Precautions / Restrictions Precautions Precautions: Sternal;Fall Precaution Comments: Able to recall 2/5 sternal precautions; reviewed all precautions with pt. Restrictions Weight Bearing Restrictions: Yes (sternal precautions)       Mobility Bed Mobility               General bed mobility comments: Pt OOB in chair upon arrival  Transfers Overall transfer level: Needs assistance Equipment used: Rolling walker (2 wheeled) Transfers: Sit to/from Stand Sit to Stand: Min guard         General transfer comment: Pt with good technique to perform sit to stand transfer from chair    Balance Overall balance assessment: Needs assistance Sitting-balance support: Feet supported;No upper extremity supported Sitting balance-Leahy Scale: Good     Standing balance support: No upper extremity supported;During functional activity Standing balance-Leahy Scale: Fair Standing balance comment: Able to stand at sink and complete grooming activities without UE support                   ADL Overall  ADL's : Needs assistance/impaired     Grooming: Min guard;Standing;Wash/dry face;Oral care Grooming Details (indicate cue type and reason): able to stand x7 minutes for grooming tasks                 Toilet Transfer: Minimal assistance;Ambulation;BSC;RW Toilet Transfer Details (indicate cue type and reason): Simulated by sit to stand from chair with functional mobiltiy in room.         Functional mobility during ADLs: Minimal assistance;Rolling walker General ADL Comments: Educated pt on sternal precautions; pt only able to recall 2/5      Vision                     Perception     Praxis      Cognition   Behavior During Therapy: [facility] for tasks assessed/performed Overall Cognitive Status: Within Functional Limits for tasks assessed                         Exercises     Shoulder Instructions       General Comments      Pertinent Vitals/ Pain       Pain Assessment: Faces Faces Pain Scale: Hurts little more Pain Location: stomach Pain Descriptors / Indicators: Aching Pain Intervention(s): Monitored during session  Home Living  Prior Functioning/Environment              Frequency  Min 2X/week        Progress Toward Goals  OT Goals(current goals can now be found in the care plan section)  Progress towards OT goals: Progressing toward goals  Acute Rehab OT Goals Patient Stated Goal: get better OT Goal Formulation: With patient  Plan Discharge plan needs to be updated    Co-evaluation                 End of Session Equipment Utilized During Treatment: Oxygen;Rolling walker  OT Visit Diagnosis: Unsteadiness on feet (R26.81)   Activity Tolerance Patient tolerated treatment well   Patient Left in chair;with call bell/phone within reach;with family/visitor present   Nurse Communication Mobility status        Time: 1444-1459 OT Time Calculation (min): 15  min  Charges: OT General Charges $OT Visit: 1 Procedure OT Treatments $Self Care/Home Management : 8-22 mins  Lorain Fettes A. Ulice Brilliant, M.S., OTR/L Pager: [phone] 03/01/2017, 3:31 PM

## 2017-03-01 NOTE — Progress Notes (Signed)
CARDIAC REHAB PHASE I   PRE:  Rate/Rhythm: 63 SR  BP:  Supine:   Sitting: 143/69  Standing:    SaO2: 96% 3L   93% 1.5L  MODE:  Ambulation: 350 ft   POST:  Rate/Rhythm: 80 SR  BP:  Supine: \  Sitting: 152/59  Standing:    SaO2: 91% 2L 1130-1153 Pt walked 350 ft on 2L after BSC use with rolling walker and asst x 1 with fairly steady gait. Tolerated well but tired after walk. Left on 2L. Set up lunch. To recliner with call bell.  Graylon Good, RN BSN  03/01/2017 11:50 AM

## 2017-03-01 NOTE — Progress Notes (Signed)
CSW spoke with patient and nieces at bedside. Family made aware that patient does have a bed available at [facility]. Per MD patient may dc tomorrow morning.  Rhea Pink, MSW,  [place]

## 2017-03-01 NOTE — Progress Notes (Addendum)
      [address]       [place] [address]             [phone]        13 Days Post-Op Procedure(s) (LRB): CORONARY ARTERY BYPASS GRAFTING (CABG) x 3 (LIMA to LAD, SVG SEQUENTIALLY to RAMUS INTERMEDIATE and DISTAL CIRCUMFLEX) WITH ENDOSCOPIC HARVESTING OF RIGHT SAPHENOUS VEIN (N/A) TRANSESOPHAGEAL ECHOCARDIOGRAM W/O CARDIOVERSION  Subjective: Patient states her breathing is fine this am.  Objective: Vital signs in last 24 hours: Temp:  [98.6 F (37 C)] 98.6 F (37 C) (03/09 0436) Pulse Rate:  [66-76] 75 (03/09 0436) Cardiac Rhythm: Normal sinus rhythm (03/08 2130) Resp:  [18] 18 (03/09 0436) BP: (140-153)/(52-76) 147/76 (03/09 0436) SpO2:  [95 %-98 %] 95 % (03/09 0436) Weight:  [78.5 kg (173 lb)] 78.5 kg (173 lb) (03/09 0218)  Pre op weight 81 kg Current Weight  03/01/17 78.5 kg (173 lb)      Intake/Output from previous day: 03/08 0701 - 03/09 0700 In: 85 [P.O.:75; I.V.:10] Out: 450 [Urine:450]   Physical Exam:  Cardiovascular: RRR, systolic murmur Pulmonary: Slightly diminished at bases Abdomen: Soft, non tender, bowel sounds present. Extremities: Trace bilateral lower extremity edema. Wounds: Clean and dry.  No erythema or signs of infection.  Lab Results: CBC:  Recent Labs  02/27/17 0430  WBC 17.5*  HGB 8.4*  HCT 26.4*  PLT 409*   BMET:   Recent Labs  02/28/17 0100 03/01/17 0425  NA 144 146*  K 3.1* 3.7  CL 97* 100*  CO2 38* 37*  GLUCOSE 90 82  BUN 27* 23*  CREATININE 1.41* 1.48*  CALCIUM 8.6* 8.6*    PT/INR:  Lab Results  Component Value Date   INR 1.52 02/16/2017   INR 0.94 02/16/2017   ABG:  INR: Will add last result for INR, ABG once components are confirmed Will add last 4 CBG results once components are confirmed  Assessment/Plan:  1.CV-Previous a  fib with RVR. Maintaining SR in the 70's. On Clonidine 0.2 mg bid, Amlodipine 10 mg daily, and Amiodarone 200 mg daily.  2. Pulmonary-On 3 liters of  oxygen via [place]. Will wean as able. Await CXR this am. Encourage incentive spirometer and flutter valve. 3. Acute on chronic diastolic heart failure-On Lasix 40 mg bid. Heart failure team following. 4. ABL anemia-Last H and H stable at 8.4 and 26.4. Continue Trinsicon. 5. Creatinine stable at 1.48. 6. DM-CBGs 221/171/77. On Insulin and Glipizide. HGA1C 6. 7. Supplement potassium 8. Remove EPW this am 9. Will need rehab once ready for discharge-CIR signed off as patient wishes to go to SNF closer to home. Hopefully, insurance will cover   ZIMMERMAN,DONIELLE MPA-C 03/01/2017,7:12 AM  Plan  to [place]  Repeat cbc in am  , wbc has been elevated   But with negative urine culture and no obvious wound or other infection Pacing wires are out Discussed SNF with patients nieces  I have seen and examined Stephanie Hendricks and agree with the above assessment  and plan.  Grace Isaac MD Beeper [phone] Office [phone] 03/01/2017 3:44 PM

## 2017-03-01 NOTE — Progress Notes (Signed)
Physical Therapy Treatment Patient Details Name: Stephanie Hendricks MRN: [id number] DOB: 09/14/1941 Today's Date: 03/01/2017    History of Present Illness Stephanie Hendricks is a very pleasant 76 yo woman with past medical history of sinus bradycardia, CKD, DM, HTN, hyperlipidemia who is coming as a transfer from [facility] for high risk NSTEMI. 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     PT Comments    Pt making gradual progress with mobility, ambulating 150 ft with rw and min guard assistance. Pt is demonstrating decreased stability but no gross loss of balance. Increased assistance needed in close quarters such as the bathroom. Based upon the patient's current mobility, recommending SNF for further rehabilitation following her acute stay.    Follow Up Recommendations  SNF;Supervision/Assistance - 24 hour     Equipment Recommendations  Rolling walker with 5" wheels    Recommendations for Other Services       Precautions / Restrictions Precautions Precautions: Sternal;Fall Precaution Comments: Able to recall 2/5 sternal precautions; reviewed all precautions with pt. Restrictions Weight Bearing Restrictions: Yes (sternal precautions)    Mobility  Bed Mobility               General bed mobility comments: Pt OOB in chair upon arrival  Transfers Overall transfer level: Needs assistance Equipment used: Rolling walker (2 wheeled) Transfers: Sit to/from Stand Sit to Stand: Min guard         General transfer comment: Pt with good technique to perform sit to stand transfer from chair  Ambulation/Gait Ambulation/Gait assistance: Min guard Ambulation Distance (Feet): 150 Feet (+10 to bathroom prior) Assistive device: Rolling walker (2 wheeled) Gait Pattern/deviations: Step-through pattern;Decreased stride length;Trunk flexed Gait velocity: decreased   General Gait Details: cues for posture provided as well as staying inside rw, pt on 2L O2.    Stairs            Wheelchair  Mobility    Modified Rankin (Stroke Patients Only)       Balance Overall balance assessment: Needs assistance Sitting-balance support: Feet supported;No upper extremity supported Sitting balance-Leahy Scale: Good     Standing balance support: No upper extremity supported;During functional activity Standing balance-Leahy Scale: Fair Standing balance comment: Able to stand at sink and complete grooming activities without UE support                    Cognition Arousal/Alertness: Awake/alert Behavior During Therapy: WFL for tasks assessed/performed Overall Cognitive Status: Within Functional Limits for tasks assessed                      Exercises      General Comments        Pertinent Vitals/Pain Pain Assessment: Faces Faces Pain Scale: Hurts little more Pain Location: stomach Pain Descriptors / Indicators: Aching Pain Intervention(s): Monitored during session    Home Living                      Prior Function            PT Goals (current goals can now be found in the care plan section) Acute Rehab PT Goals Patient Stated Goal: get better PT Goal Formulation: With patient Time For Goal Achievement: 02/27/17 Potential to Achieve Goals: Good Progress towards PT goals: Progressing toward goals    Frequency    Min 3X/week      PT Plan Current plan remains appropriate    Co-evaluation  End of Session Equipment Utilized During Treatment: Gait belt;Oxygen Activity Tolerance: Patient tolerated treatment well Patient left: in chair;with call bell/phone within reach;with family/visitor present Nurse Communication: Mobility status PT Visit Diagnosis: Unsteadiness on feet (R26.81);Other abnormalities of gait and mobility (R26.89);Muscle weakness (generalized) (M62.81);Difficulty in walking, not elsewhere classified (R26.2)     Time: 7915-0569 PT Time Calculation (min) (ACUTE ONLY): 31 min  Charges:  $Gait Training:  8-22 mins $Therapeutic Activity: 8-22 mins                    G Codes:       Cassell Clement, PT, CSCS Pager [phone] Office [phone]  03/01/2017, 3:32 PM

## 2017-03-02 ENCOUNTER — Inpatient Hospital Stay
Admission: RE | Admit: 2017-03-02 | Discharge: 2017-03-22 | Disposition: A | Discharge status: Home / Self Care | Payer: Medicare Other | Source: Ambulatory Visit | Attending: Internal Medicine | Admitting: Internal Medicine

## 2017-03-02 DIAGNOSIS — E785 Hyperlipidemia, unspecified: Secondary | ICD-10-CM | POA: Diagnosis not present

## 2017-03-02 DIAGNOSIS — N898 Other specified noninflammatory disorders of vagina: Secondary | ICD-10-CM | POA: Diagnosis not present

## 2017-03-02 DIAGNOSIS — I509 Heart failure, unspecified: Secondary | ICD-10-CM | POA: Diagnosis not present

## 2017-03-02 DIAGNOSIS — M6281 Muscle weakness (generalized): Secondary | ICD-10-CM | POA: Diagnosis not present

## 2017-03-02 DIAGNOSIS — D631 Anemia in chronic kidney disease: Secondary | ICD-10-CM | POA: Diagnosis not present

## 2017-03-02 DIAGNOSIS — I214 Non-ST elevation (NSTEMI) myocardial infarction: Secondary | ICD-10-CM | POA: Diagnosis not present

## 2017-03-02 DIAGNOSIS — N183 Chronic kidney disease, stage 3 (moderate): Secondary | ICD-10-CM | POA: Diagnosis not present

## 2017-03-02 DIAGNOSIS — I480 Paroxysmal atrial fibrillation: Secondary | ICD-10-CM | POA: Diagnosis not present

## 2017-03-02 DIAGNOSIS — E1121 Type 2 diabetes mellitus with diabetic nephropathy: Secondary | ICD-10-CM | POA: Diagnosis not present

## 2017-03-02 DIAGNOSIS — I2510 Atherosclerotic heart disease of native coronary artery without angina pectoris: Secondary | ICD-10-CM | POA: Diagnosis not present

## 2017-03-02 DIAGNOSIS — I5032 Chronic diastolic (congestive) heart failure: Secondary | ICD-10-CM | POA: Diagnosis not present

## 2017-03-02 DIAGNOSIS — R262 Difficulty in walking, not elsewhere classified: Secondary | ICD-10-CM | POA: Diagnosis not present

## 2017-03-02 DIAGNOSIS — I5033 Acute on chronic diastolic (congestive) heart failure: Secondary | ICD-10-CM | POA: Diagnosis not present

## 2017-03-02 DIAGNOSIS — I359 Nonrheumatic aortic valve disorder, unspecified: Secondary | ICD-10-CM | POA: Diagnosis not present

## 2017-03-02 DIAGNOSIS — I10 Essential (primary) hypertension: Secondary | ICD-10-CM | POA: Diagnosis not present

## 2017-03-02 DIAGNOSIS — R001 Bradycardia, unspecified: Secondary | ICD-10-CM | POA: Diagnosis not present

## 2017-03-02 LAB — CBC
HEMATOCRIT: 27.8 % — AB (ref 36.0–46.0)
Hemoglobin: 8.5 g/dL — ABNORMAL LOW (ref 12.0–15.0)
MCH: 25.2 pg — ABNORMAL LOW (ref 26.0–34.0)
MCHC: 30.6 g/dL (ref 30.0–36.0)
MCV: 82.5 fL (ref 78.0–100.0)
Platelets: 460 10*3/uL — ABNORMAL HIGH (ref 150–400)
RBC: 3.37 MIL/uL — ABNORMAL LOW (ref 3.87–5.11)
RDW: 19.8 % — AB (ref 11.5–15.5)
WBC: 14.9 10*3/uL — AB (ref 4.0–10.5)

## 2017-03-02 LAB — GLUCOSE, CAPILLARY
Glucose-Capillary: 122 mg/dL — ABNORMAL HIGH (ref 65–99)
Glucose-Capillary: 188 mg/dL — ABNORMAL HIGH (ref 65–99)

## 2017-03-02 LAB — BASIC METABOLIC PANEL
ANION GAP: 10 (ref 5–15)
BUN: 21 mg/dL — ABNORMAL HIGH (ref 6–20)
CALCIUM: 8.6 mg/dL — AB (ref 8.9–10.3)
CO2: 36 mmol/L — ABNORMAL HIGH (ref 22–32)
CREATININE: 1.4 mg/dL — AB (ref 0.44–1.00)
Chloride: 97 mmol/L — ABNORMAL LOW (ref 101–111)
GFR, EST AFRICAN AMERICAN: 41 mL/min — AB (ref >60)
GFR, EST NON AFRICAN AMERICAN: 35 mL/min — AB (ref >60)
Glucose, Bld: 144 mg/dL — ABNORMAL HIGH (ref 65–99)
Potassium: 3.6 mmol/L (ref 3.5–5.1)
SODIUM: 143 mmol/L (ref 135–145)

## 2017-03-02 MED ORDER — TRAMADOL HCL 50 MG PO TABS: 50.0000 mg | ORAL_TABLET | Freq: Four times a day (QID) | ORAL | 0 refills | Status: DC | PRN

## 2017-03-02 MED ORDER — AMIODARONE HCL 200 MG PO TABS: 200.0000 mg | ORAL_TABLET | Freq: Every day | ORAL | Status: DC

## 2017-03-02 MED ORDER — FUROSEMIDE 40 MG PO TABS: 40.0000 mg | ORAL_TABLET | Freq: Two times a day (BID) | ORAL | Status: DC

## 2017-03-02 MED ORDER — CEPHALEXIN 500 MG PO CAPS: 500.0000 mg | ORAL_CAPSULE | Freq: Three times a day (TID) | ORAL | Status: DC

## 2017-03-02 MED ORDER — CEPHALEXIN 500 MG PO CAPS
500.0000 mg | ORAL_CAPSULE | Freq: Three times a day (TID) | ORAL | Status: DC
  Administered 2017-03-02: 500 mg via ORAL
  Filled 2017-03-02: qty 1

## 2017-03-02 MED ORDER — POTASSIUM CHLORIDE CRYS ER 20 MEQ PO TBCR
40.0000 meq | EXTENDED_RELEASE_TABLET | Freq: Every day | ORAL | Status: AC
Start: 2017-03-02 — End: ?

## 2017-03-02 MED ORDER — CLONIDINE HCL 0.2 MG PO TABS: 0.2000 mg | ORAL_TABLET | Freq: Two times a day (BID) | ORAL | Status: DC

## 2017-03-02 MED ORDER — AMLODIPINE BESYLATE 10 MG PO TABS: 10.0000 mg | ORAL_TABLET | Freq: Every day | ORAL | Status: AC

## 2017-03-02 MED ORDER — ACETAMINOPHEN 325 MG PO TABS: 650.0000 mg | ORAL_TABLET | Freq: Four times a day (QID) | ORAL | Status: AC | PRN

## 2017-03-02 NOTE — Progress Notes (Signed)
Discharged to home with family office visits in place teaching done  

## 2017-03-02 NOTE — Clinical Social Work Placement (Deleted)
   CLINICAL SOCIAL WORK PLACEMENT  NOTE  Date:  03/02/2017  Patient Details  Name: Stephanie Hendricks MRN: [id number] Date of Birth: February 25, 1941  Clinical Social Work is seeking post-discharge placement for this patient at the [place] level of care (*CSW will initial, date and re-position this form in  chart as items are completed):  Yes   Patient/family provided with [place] Work Department's list of facilities offering this level of care within the geographic area requested by the patient (or if unable, by the patient's family).  Yes   Patient/family informed of their freedom to choose among providers that offer the needed level of care, that participate in Medicare, Medicaid or managed care program needed by the patient, have an available bed and are willing to accept the patient.  Yes   Patient/family informed of [place]'s ownership interest in [facility] and [facility], as well as of the fact that they are under no obligation to receive care at these facilities.  PASRR submitted to EDS on       PASRR number received on 02/28/17     Existing PASRR number confirmed on       FL2 transmitted to all facilities in geographic area requested by pt/family on 02/28/17     FL2 transmitted to all facilities within larger geographic area on       Patient informed that his/her managed care company has contracts with or will negotiate with certain facilities, including the following:        Yes   Patient/family informed of bed offers received.  Patient chooses bed at  [facility])     Physician recommends and patient chooses bed at      Patient to be transferred to  [facility]) on 03/02/17.  Patient to be transferred to facility by  [facility])     Patient family notified on 03/02/17 of transfer.  Name of family member notified:        PHYSICIAN       Additional Comment:     _______________________________________________ Serafina Mitchell, [place] 03/02/2017, 1:11 PM

## 2017-03-02 NOTE — Progress Notes (Signed)
[id number] Pt going for SNF. Assisted to bathroom and then to recliner. Did not ambulate as she wanted to save energy for discharge. Education completed with pt. Had her demonstrate IS. She is only getting 250-300 ml. Reviewed sternal precautions and modified ex ed. Referring to [place] Phase 2. Put on discharge video for her to view. Encouraged heart healthy diet. Graylon Good RN BSN 03/02/2017 10:33 AM

## 2017-03-02 NOTE — Clinical Social Work Placement (Signed)
   CLINICAL SOCIAL WORK PLACEMENT  NOTE  Date:  03/02/2017  Patient Details  Name: Stephanie Hendricks MRN: [id number] Date of Birth: [birth year]  Clinical Social Work is seeking post-discharge placement for this patient at the [place] level of care (*CSW will initial, date and re-position this form in  chart as items are completed):  Yes   Patient/family provided with [place] Work Department's list of facilities offering this level of care within the geographic area requested by the patient (or if unable, by the patient's family).  Yes   Patient/family informed of their freedom to choose among providers that offer the needed level of care, that participate in Medicare, Medicaid or managed care program needed by the patient, have an available bed and are willing to accept the patient.  Yes   Patient/family informed of [place]'s ownership interest in [facility] and [facility], as well as of the fact that they are under no obligation to receive care at these facilities.  PASRR submitted to EDS on       PASRR number received on 02/28/17     Existing PASRR number confirmed on       FL2 transmitted to all facilities in geographic area requested by pt/family on 02/28/17     FL2 transmitted to all facilities within larger geographic area on       Patient informed that his/her managed care company has contracts with or will negotiate with certain facilities, including the following:        Yes   Patient/family informed of bed offers received.  Patient chooses bed at  [facility])     Physician recommends and patient chooses bed at      Patient to be transferred to  [facility]) on 03/02/17.  Patient to be transferred to facility by  [facility])     Patient family notified on 03/02/17 of transfer.  Name of family member notified:        PHYSICIAN       Additional Comment:     _______________________________________________ Serafina Mitchell, [place] 03/02/2017, 1:11 PM

## 2017-03-02 NOTE — Progress Notes (Addendum)
[address]       [place] [address]             [phone]      14 Days Post-Op Procedure(s) (LRB): CORONARY ARTERY BYPASS GRAFTING (CABG) x 3 (LIMA to LAD, SVG SEQUENTIALLY to RAMUS INTERMEDIATE and DISTAL CIRCUMFLEX) WITH ENDOSCOPIC HARVESTING OF RIGHT SAPHENOUS VEIN (N/A) TRANSESOPHAGEAL ECHOCARDIOGRAM W/O CARDIOVERSION   Subjective:  Ms. Stephanie Hendricks has no new complaints.  She wants to go SNF today.  + ambulation with assistance.  + BM  Objective: Vital signs in last 24 hours: Temp:  [97.8 F (36.6 C)-100.8 F (38.2 C)] 98.5 F (36.9 C) (03/10 0602) Pulse Rate:  [65-76] 65 (03/10 0602) Cardiac Rhythm: Normal sinus rhythm (03/10 0701) Resp:  [18-20] 18 (03/10 0602) BP: (128-159)/(55-77) 146/77 (03/10 0602) SpO2:  [95 %-97 %] 96 % (03/10 0602) Weight:  [172 lb 8 oz (78.2 kg)] 172 lb 8 oz (78.2 kg) (03/10 0500)  Intake/Output from previous day: 03/09 0701 - 03/10 0700 In: 260 [P.O.:240; I.V.:20] Out: -   General appearance: alert, cooperative and no distress Heart: regular rate and rhythm Lungs: diminished breath sounds bibasilar Abdomen: soft, non-tender; bowel sounds normal; no masses,  no organomegaly Extremities: edema trace Wound: clean and dry, there is some minor erythema at [facility] site, tender and hardened at [facility] tract likely due to hematoma  Lab Results:  Recent Labs  03/02/17 0456  WBC 14.9*  HGB 8.5*  HCT 27.8*  PLT 460*   BMET:  Recent Labs  03/01/17 0425 03/02/17 0456  NA 146* 143  K 3.7 3.6  CL 100* 97*  CO2 37* 36*  GLUCOSE 82 144*  BUN 23* 21*  CREATININE 1.48* 1.40*  CALCIUM 8.6* 8.6*    PT/INR: No results for input(s): LABPROT, INR in the last 72 hours. ABG    Component Value Date/Time   PHART 7.335 (L) 02/25/2017 1200   HCO3 29.3 (H) 02/25/2017 1200   TCO2 26 02/19/2017 1036   ACIDBASEDEF 1.0 02/19/2017 1036   O2SAT 57.2 02/27/2017 0518   CBG (last 3)   Recent Labs  03/01/17 1616 03/01/17 2056  03/02/17 0642  GLUCAP 201* 100* 122*    Assessment/Plan: S/P Procedure(s) (LRB): CORONARY ARTERY BYPASS GRAFTING (CABG) x 3 (LIMA to LAD, SVG SEQUENTIALLY to RAMUS INTERMEDIATE and DISTAL CIRCUMFLEX) WITH ENDOSCOPIC HARVESTING OF RIGHT SAPHENOUS VEIN (N/A) TRANSESOPHAGEAL ECHOCARDIOGRAM W/O CARDIOVERSION  1. CV- Sinus Brady, + systolic murmur- BP remains elevated at times- will continue Amiodarone, Norvasc, clonidine, will restart home ARB at reduced dose 2. Pulm- no acute issues, mild pulmonary edema last CXR, encouraged aggressive Pulm toilet for discharge 3. Renal- creatinine mildy improved at 1.40, weight is improving, will require daily Lasix at discharge 4. ID- leukocytosis improving, low grade temp today, no evidence of infection other than mild erythema of EVH site... Will give Empiric Keflex as being diabetic, hematoma can easily become infected 5. DM- sugars, ok continue current diabetes regimen 6. Dispo- patient stable, no acute issues other than low grade fever, will start Keflex for possible early cellulitis of EVH site, will plan to d/c to SNF today   LOS: 14 days    BARRETT, ERIN 03/02/2017  I have seen and examined the patient and agree with the assessment and plan as outlined.  WBC down to 14,900 and renal function stable.  Agree w/ short term Keflex due to thigh hematoma, possible mild cellulitis.  Rexene Alberts, MD 03/02/2017 10:55 AM

## 2017-03-02 NOTE — Clinical Social Work Note (Signed)
Clinical Social Worker facilitated patient discharge including contacting patient family and facility to confirm patient discharge plans.  Clinical information faxed to facility and family agreeable with plan.  CSW arranged ambulance transport via [facility] to [facility]. RN to call report [phone] prior to discharge.  Clinical Social Worker will sign off for now as social work intervention is no longer needed. Please consult [place] again if new need arises.  Shironda Kain B. Joline Maxcy Clinical Social Work Dept Weekend Social Worker [phone] 1:08 PM

## 2017-03-02 NOTE — Discharge Instructions (Signed)
1. Please obtain vital signs at least one time daily 2.Please weigh the patient daily. If he or she continues to gain weight or develops lower extremity edema, contact the office at [phone]. 3. Ambulate patient at least three times daily and please use sternal precautions.     Coronary Artery Bypass Grafting, Care After This sheet gives you information about how to care for yourself after your procedure. Your health care provider may also give you more specific instructions. If you have problems or questions, contact your health care provider. What can I expect after the procedure? After the procedure, it is common to have:  Nausea and a lack of appetite.  Constipation.  Weakness and fatigue.  Depression or irritability.  Pain or discomfort in your incision areas. Follow these instructions at home: Medicines   Take over-the-counter and prescription medicines only as told by your health care provider. Do not stop taking medicines or start any new medicines without approval from your health care provider.  If you were prescribed an antibiotic medicine, take it as told by your health care provider. Do not stop taking the antibiotic even if you start to feel better.  Do not drive or use heavy machinery while taking prescription pain medicine. Incision care   Follow instructions from your health care provider about how to take care of your incisions. Make sure you:  Wash your hands with soap and water before you change your bandage (dressing). If soap and water are not available, use hand sanitizer.  Change your dressing as told by your health care provider.  Leave stitches (sutures), skin glue, or adhesive strips in place. These skin closures may need to stay in place for 2 weeks or longer. If adhesive strip edges start to loosen and curl up, you may trim the loose edges. Do not remove adhesive strips completely unless your health care provider tells you to do that.  Keep  incision areas clean, dry, and protected.  Check your incision areas every day for signs of infection. Check for:  More redness, swelling, or pain.  More fluid or blood.  Warmth.  Pus or a bad smell.  If incisions were made in your legs:  Avoid crossing your legs.  Avoid sitting for long periods of time. Change positions every 30 minutes.  Raise (elevate) your legs when you are sitting. Bathing   Do not take baths, swim, or use a hot tub until your health care provider approves.  Only take sponge baths. Pat the incisions dry. Do not rub incisions with a washcloth or towel.  Ask your health care provider when you can shower. Eating and drinking   Eat foods that are high in fiber, such as raw fruits and vegetables, whole grains, beans, and nuts. Meats should be lean cut. Avoid canned, processed, and fried foods. This can help prevent constipation and is a recommended part of a heart-healthy diet.  Drink enough fluid to keep your urine clear or pale yellow.  Limit alcohol intake to no more than 1 drink a day for nonpregnant women and 2 drinks a day for men. One drink equals 12 oz of beer, 5 oz of wine, or 1 oz of hard liquor. Activity   Rest and limit your activity as told by your health care provider. You may be instructed to:  Stop any activity right away if you have chest pain, shortness of breath, irregular heartbeats, or dizziness. Get help right away if you have any of these symptoms.  Move around frequently for short periods or take short walks as directed by your health care provider. Gradually increase your activities. You may need physical therapy or cardiac rehabilitation to help strengthen your muscles and build your endurance.  Avoid lifting, pushing, or pulling anything that is heavier than 10 lb (4.5 kg) for at least 6 weeks or as told by your health care provider.  Do not drive until your health care provider approves.  Ask your health care provider when you  may return to work.  Ask your health care provider when you may resume sexual activity. General instructions   Do not use any products that contain nicotine or tobacco, such as cigarettes and e-cigarettes. If you need help quitting, ask your health care provider.  Take 2-3 deep breaths every few hours during the day, while you recover. This helps expand your lungs and prevent complications like pneumonia after surgery.  If you were given a device called an incentive spirometer, use it several times a day to practice deep breathing. Support your chest with a pillow or your arms when you take deep breaths or cough.  Wear compression stockings as told by your health care provider. These stockings help to prevent blood clots and reduce swelling in your legs.  Weigh yourself every day. This helps identify if your body is holding (retaining) fluid that may make your heart and lungs work harder.  Keep all follow-up visits as told by your health care provider. This is important. Contact a health care provider if:  You have more redness, swelling, or pain around any incision.  You have more fluid or blood coming from any incision.  Any incision feels warm to the touch.  You have pus or a bad smell coming from any incision  You have a fever.  You have swelling in your ankles or legs.  You have pain in your legs.  You gain 2 lb (0.9 kg) or more a day.  You are nauseous or you vomit.  You have diarrhea. Get help right away if:  You have chest pain that spreads to your jaw or arms.  You are short of breath.  You have a fast or irregular heartbeat.  You notice a "clicking" in your breastbone (sternum) when you move.  You have numbness or weakness in your arms or legs.  You feel dizzy or light-headed. Summary  After the procedure, it is common to have pain or discomfort in the incision areas.  Do not take baths, swim, or use a hot tub until your health care provider  approves.  Gradually increase your activities. You may need physical therapy or cardiac rehabilitation to help strengthen your muscles and build your endurance.  Weigh yourself every day. This helps identify if your body is holding (retaining) fluid that may make your heart and lungs work harder. This information is not intended to replace advice given to you by your health care provider. Make sure you discuss any questions you have with your health care provider. Document Released: 06/29/2005 Document Revised: 10/29/2016 Document Reviewed: 10/29/2016 Elsevier Interactive Patient Education  2017 [employer].   Endoscopic Saphenous Vein Harvesting, Care After Refer to this sheet in the next few weeks. These instructions provide you with information about caring for yourself after your procedure. Your health care provider may also give you more specific instructions. Your treatment has been planned according to current medical practices, but problems sometimes occur. Call your health care provider if you have any problems or  questions after your procedure. What can I expect after the procedure? After the procedure, it is common to have:  Pain.  Bruising.  Swelling.  Numbness. Follow these instructions at home: Medicine   Take over-the-counter and prescription medicines only as told by your health care provider.  Do not drive or operate heavy machinery while taking prescription pain medicine. Incision care    Follow instructions from your health care provider about how to take care of the cut made during surgery (incision). Make sure you:  Wash your hands with soap and water before you change your bandage (dressing). If soap and water are not available, use hand sanitizer.  Change your dressing as told by your health care provider.  Leave stitches (sutures), skin glue, or adhesive strips in place. These skin closures may need to be in place for 2 weeks or longer. If adhesive strip  edges start to loosen and curl up, you may trim the loose edges. Do not remove adhesive strips completely unless your health care provider tells you to do that.  Check your incision area every day for signs of infection. Check for:  More redness, swelling, or pain.  More fluid or blood.  Warmth.  Pus or a bad smell. General instructions   Raise (elevate) your legs above the level of your heart while you are sitting or lying down.  Do any exercises your health care providers have given you. These may include deep breathing, coughing, and walking exercises.  Do not shower, take baths, swim, or use a hot tub unless told by your health care provider.  Wear your elastic stocking if told by your health care provider.  Keep all follow-up visits as told by your health care provider. This is important. Contact a health care provider if:  Medicine does not help your pain.  Your pain gets worse.  You have new leg bruises or your leg bruises get bigger.  You have a fever.  Your leg feels numb.  You have more redness, swelling, or pain around your incision.  You have more fluid or blood coming from your incision.  Your incision feels warm to the touch.  You have pus or a bad smell coming from your incision. Get help right away if:  Your pain is severe.  You develop pain, tenderness, warmth, redness, or swelling in any part of your leg.  You have chest pain.  You have trouble breathing. This information is not intended to replace advice given to you by your health care provider. Make sure you discuss any questions you have with your health care provider. Document Released: 08/22/2011 Document Revised: 05/17/2016 Document Reviewed: 10/24/2015 Elsevier Interactive Patient Education  2017 [employer].

## 2017-03-04 ENCOUNTER — Non-Acute Institutional Stay (SKILLED_NURSING_FACILITY): Payer: Medicare Other | Admitting: Internal Medicine

## 2017-03-04 DIAGNOSIS — I5033 Acute on chronic diastolic (congestive) heart failure: Secondary | ICD-10-CM | POA: Diagnosis not present

## 2017-03-04 DIAGNOSIS — D62 Acute posthemorrhagic anemia: Secondary | ICD-10-CM | POA: Diagnosis not present

## 2017-03-04 DIAGNOSIS — N183 Chronic kidney disease, stage 3 unspecified: Secondary | ICD-10-CM

## 2017-03-04 DIAGNOSIS — I214 Non-ST elevation (NSTEMI) myocardial infarction: Secondary | ICD-10-CM

## 2017-03-04 DIAGNOSIS — E1121 Type 2 diabetes mellitus with diabetic nephropathy: Secondary | ICD-10-CM | POA: Diagnosis not present

## 2017-03-04 DIAGNOSIS — I2510 Atherosclerotic heart disease of native coronary artery without angina pectoris: Secondary | ICD-10-CM | POA: Diagnosis not present

## 2017-03-05 ENCOUNTER — Encounter (HOSPITAL_COMMUNITY)
Admission: RE | Admit: 2017-03-05 | Discharge: 2017-03-05 | Disposition: A | Discharge status: Home / Self Care | Payer: Medicare Other | Source: Skilled Nursing Facility | Attending: Internal Medicine | Admitting: Internal Medicine

## 2017-03-05 LAB — CBC WITH DIFFERENTIAL/PLATELET
Basophils Absolute: 0 10*3/uL (ref 0.0–0.1)
Basophils Relative: 0 %
EOS ABS: 0.3 10*3/uL (ref 0.0–0.7)
EOS PCT: 2 %
HCT: 33.3 % — ABNORMAL LOW (ref 36.0–46.0)
Hemoglobin: 10.5 g/dL — ABNORMAL LOW (ref 12.0–15.0)
LYMPHS ABS: 2.2 10*3/uL (ref 0.7–4.0)
Lymphocytes Relative: 18 %
MCH: 26.6 pg (ref 26.0–34.0)
MCHC: 31.5 g/dL (ref 30.0–36.0)
MCV: 84.3 fL (ref 78.0–100.0)
MONO ABS: 0.8 10*3/uL (ref 0.1–1.0)
MONOS PCT: 7 %
Neutro Abs: 9.1 10*3/uL — ABNORMAL HIGH (ref 1.7–7.7)
Neutrophils Relative %: 73 %
PLATELETS: 544 10*3/uL — AB (ref 150–400)
RBC: 3.95 MIL/uL (ref 3.87–5.11)
RDW: 18.7 % — ABNORMAL HIGH (ref 11.5–15.5)
WBC: 12.4 10*3/uL — AB (ref 4.0–10.5)

## 2017-03-05 LAB — COMPREHENSIVE METABOLIC PANEL
ALT: 72 U/L — ABNORMAL HIGH (ref 14–54)
ANION GAP: 9 (ref 5–15)
AST: 41 U/L (ref 15–41)
Albumin: 3.1 g/dL — ABNORMAL LOW (ref 3.5–5.0)
Alkaline Phosphatase: 102 U/L (ref 38–126)
BUN: 23 mg/dL — ABNORMAL HIGH (ref 6–20)
CALCIUM: 9 mg/dL (ref 8.9–10.3)
CHLORIDE: 95 mmol/L — AB (ref 101–111)
CO2: 35 mmol/L — AB (ref 22–32)
Creatinine, Ser: 1.68 mg/dL — ABNORMAL HIGH (ref 0.44–1.00)
GFR, EST AFRICAN AMERICAN: 33 mL/min — AB (ref >60)
GFR, EST NON AFRICAN AMERICAN: 28 mL/min — AB (ref >60)
Glucose, Bld: 316 mg/dL — ABNORMAL HIGH (ref 65–99)
Potassium: 4.3 mmol/L (ref 3.5–5.1)
SODIUM: 139 mmol/L (ref 135–145)
Total Bilirubin: 0.5 mg/dL (ref 0.3–1.2)
Total Protein: 8.1 g/dL (ref 6.5–8.1)

## 2017-03-05 NOTE — Progress Notes (Signed)
This is an acute visit.  Level care skilled.  Facility is [facility].  Chief complaint-acute visit  status post MI with CABG 3.  History of present illness.  Patient is a 76 year old female with a history of sinus bradycardia chronic kidney disease diabetes type 2 hypertension and hyperlipidemia-she presented to hospital with complaints of chest pain.  There was radiation to her jaw ears and back.  EKG showed diffuse ST depression with ST elevation in aVR.  Troponin was elevated at 0.08.  His transferred to [facility] and taken to cath lab for [place] EMI.  Catheterization showed severe three-vessel coronary artery disease with element involvement.  She received emergent coronary bypass grafting.  She tolerated the procedure without difficulty.  Postop she did develop rapid atrial fibrillation treated with IV amiodarone.  .  She also is hypertensive and medications were titrated.  She also was diuresed for hypovolemia.  She developed thrombocytopenia which resolved prior to discharge.  She was transferred to stepdown.  She did require transfusion her postop blood loss.  Also developed nuchal cytosis-UA was negative chest x-ray showed worsening pulmonary edema she develop worsening oxygen saturations of shortness of breath.  She had to be transferred back to the SICU. Where she was aggressively diuresed.  Apparently she had preserved ejection fraction.  There was some concern about PE but apparently this was relatively mino  she was on Lovenox for DVT prophylaxis d-dimer was elevated at 5.97.  Due to her stage IV chronic kidney disease and improvement of symptoms with diuresis she did not undergo a CT scan.  She improved clinically.  She continues on Lasix 40 mg twice a day.  She was deconditioned and thaw skilled nursing would be needed.  Currently she is not complaining of chest pain or shortness of breath she is resting comfortably in her chair  vital signs are stable blood pressure somewhat elevated at 150/69 we will monitor this -- appears to be feeling relatively well.  Patient Active Problem List   Diagnosis Date Noted  . AKI (acute kidney injury) ([place])   . Stage 3 chronic kidney disease   . Benign essential HTN   . Diastolic dysfunction   . Acute blood loss anemia   . Leukocytosis   . Tachypnea   . NSTEMI (non-ST elevated myocardial infarction) ([place]) 02/16/2017  . Coronary artery disease 02/16/2017  . Acute gastroenteritis 11/07/2014  . Intractable nausea and vomiting 11/06/2014  . Obesity 11/06/2014  . Chronic kidney disease (CKD), stage III (moderate) 11/06/2014  . Microcytic anemia 11/06/2014  . Aortic valve disorders 10/22/2012  . Bradycardia   . Essential hypertension   . Hyperlipidemia   . Type II diabetes mellitus with nephropathy ([place])   . Hx of colonic polyps 08/21/2011   Discharge Diagnoses:       Patient Active Problem List   Diagnosis Date Noted  . Acute on chronic diastolic CHF (congestive heart failure) ([place])   . S/P CABG x 3   . AKI (acute kidney injury) ([place])   . Stage 3 chronic kidney disease   . Benign essential HTN   . Diastolic dysfunction   . Acute blood loss anemia   . Leukocytosis   . Tachypnea   . NSTEMI (non-ST elevated myocardial infarction) ([place]) 02/16/2017  . Coronary artery disease 02/16/2017  . Acute gastroenteritis 11/07/2014  . Intractable nausea and vomiting 11/06/2014  . Obesity 11/06/2014  . Chronic kidney disease (CKD), stage III (moderate) 11/06/2014  . Microcytic anemia 11/06/2014  .  Aortic valve disorders 10/22/2012  . Bradycardia   . Essential hypertension   . Hyperlipidemia   . Type II diabetes mellitus with nephropathy ([place])   . Hx of colonic polyps 08/21/2011   TAKE these medications   acetaminophen 325 MG tablet Commonly known as:  TYLENOL Take 2 tablets (650 mg total) by mouth every 6 (six) hours as needed for mild pain.     amiodarone 200 MG tablet Commonly known as:  PACERONE Take 1 tablet (200 mg total) by mouth daily.   amLODipine 10 MG tablet Commonly known as:  NORVASC Take 1 tablet (10 mg total) by mouth daily.   aspirin 81 MG tablet Take 81 mg by mouth daily.   cephALEXin 500 MG capsule Commonly known as:  KEFLEX Take 1 capsule (500 mg total) by mouth every 8 (eight) hours. For 7 Days   cloNIDine 0.2 MG tablet Commonly known as:  CATAPRES Take 1 tablet (0.2 mg total) by mouth 2 (two) times daily. Replaces:  cloNIDine 0.3 mg/24hr patch   furosemide 40 MG tablet Commonly known as:  LASIX Take 1 tablet (40 mg total) by mouth 2 (two) times daily.   glipiZIDE 5 MG 24 hr tablet Commonly known as:  GLIPIZIDE XL Take 1 tablet (5 mg total) by mouth daily with breakfast. Do not take this medication if your blood sugar is less than 110.   Linagliptin-Metformin HCl 2.5-500 MG Tabs Commonly known as:  JENTADUETO Take 1 tablet by mouth 2 (two) times daily. Do not take this medication if your blood sugar is less than 110.   potassium chloride SA 20 MEQ tablet Commonly known as:  K-DUR,KLOR-CON Take 2 tablets (40 mEq total) by mouth daily.   simvastatin 20 MG tablet Commonly known as:  ZOCOR TAKE (1) TABLET BY MOUTH ONCE DAILY AT BEDTIME FOR CHOLESTEROL.   traMADol 50 MG tablet Commonly known as:  ULTRAM Take 1 tablet (50 mg total) by mouth every 6 (six) hours as needed for severe pain.      Family History  Problem Relation Age of Onset  . Hypertension Father   . Coronary artery disease Sister   . Heart attack Brother     Deceased  . Colon cancer Neg Hx   . Liver disease Neg Hx    Social History:  reports that she has never smoked. She has never used smokeless tobacco. She reports that she does not drink alcohol or use drugs.  Allergies: No Known Allergies  Review of systems.  In general does not complaining of any shortness of breath fever or chills says he  eats well.  Skin does not complain of rashes or itching surgical sites appear to be fairly unremarkable.  Head ears eyes nose mouth and throat does not complain a sore throat or visual changes.  Respiratory denies any increase shortness of breath or cough.  Cardiac is not complaining of chest pain has some mild lower extremity edema.  GI does not complain of nausea vomiting diarrhea constipation since she eats well.  GU does not pain of dysuria.  Muscle skeletal says she is somewhat weak but denies joint pain at this time.  Neurologic is not complaining of dizziness headache or syncope.  Psych is not complaining of overt depression or anxiety.   Physical exam.  Temperature is 97.5 pulse 80 respirations 18 blood pressure 150/69.  In general this is a well-nourished elderly female in no distress sitting E in her chair.  Her skin is warm and dry  there is a well-healed midline chest scar with crusting I do not see signs of drainage or infection here or erythema.  Also vein harvest site right mid leg appears to be unremarkable also has some small crusted areas incision sites on her abdomen that appear unremarkable.  Eyes pupils appear reactive light sclera and conjunctiva are clear oropharynx is clear mucous membranes moist.  Chest is clear to auscultation with shallow air entry there is no labored breathing.  Heart is regular rate and rhythm without murmur gallop or rub she has trace-1+ lower extremity edema pedal pulses are intact bilaterally she has compression hose applied.  Abdomen soft nontender with positive bowel sounds.  Muscle skeletal is able to move all extremities 4 upper extremity strength appears preserved has some lower extremity weakness.  Neurologic is grossly intact no lateralizing findings her speech is clear.  Psych she appears alert and oriented pleasant and appropriate   Labs.  03/02/2017.  WBC 14.9 hemoglobin 8.5 platelets 460.  Sodium 143  potassium 3.6 BUN 21 creatinine 1.40.  03/01/2017.  Magnesium 1.9.   #1-history of coronary artery disease status post MI status post CABG 3-this appears relatively stable she is on aspirin and statin denies any chest pain or increased shortness of breath at this point will monitor.  #2 history of-relation she continues on amiodarone for rate control this appears to be currently rate controlled she is on amiodarone-continues on aspirin as well as for anticoagulation.  #3 history of diabetes type 2 she is on glipizide XL 5 mg as well as Jent adueto blood sugars so far was 135 yesterday morning-153 this morning-appears to run more in the 200s later in the day will obtain more readings before making any changes.  #4 history of hypertension she is on Norvasc as well as clonidine-0.2 mg twice a day-at this point since we have fairly minimal readings will monitor systolic is 376 today.  #5 history of hyperlipidemia she continues on a statin.  #6 history of thrombocytopenia apparently this was transitory in the hospital appears to have normalize will update a CBC.  #7 history of blood loss anemia status post transfusion hemoglobin 8.5 on lab done 03/02/2017 will update a CBC.  #8 history of volume overload with a preserved ejection fraction she is on Lasix 40 mg twice a day as well as potassium supplementation-her weights will have to be monitored closely and notify provider of gain greater than 3 pounds.  #9 history leukocytosis this appears have been stable during hospitalization will check a CBC with differential for follow-up. She does not complain of any fever or chills diarrhea increased cough and congestion or increased dysuria.  I do note what that is for although suspect they may suspect a UTI and put her on this empirically. Culture did show multiple species in hospital  Another possibility is concerns for cellulitis surgical sites look stable today I do not really see increased  erythema.  #10 history of chronic kidney disease-creatinine of 1.4 appears to be relatively baseline appears in hospital was between 1.4-1.6-we will update this as well area  [id number]-of note greater than 45 minutes spent assessing patient-reviewing her chart-reviewing her labs-and coordinating and formulating a plan of care for numerous diagnoses-of note greater than 50% of time spent coordinating plan of care

## 2017-03-06 ENCOUNTER — Non-Acute Institutional Stay (SKILLED_NURSING_FACILITY): Payer: Medicare Other | Admitting: Internal Medicine

## 2017-03-06 DIAGNOSIS — N183 Chronic kidney disease, stage 3 unspecified: Secondary | ICD-10-CM

## 2017-03-06 DIAGNOSIS — E1121 Type 2 diabetes mellitus with diabetic nephropathy: Secondary | ICD-10-CM

## 2017-03-06 DIAGNOSIS — I214 Non-ST elevation (NSTEMI) myocardial infarction: Secondary | ICD-10-CM | POA: Diagnosis not present

## 2017-03-06 DIAGNOSIS — I5033 Acute on chronic diastolic (congestive) heart failure: Secondary | ICD-10-CM

## 2017-03-06 NOTE — Progress Notes (Signed)
03/06/17  Facility; [facility]  Chief complaint; admission to SNF post stay at [facility] 02/16/17 through 03/02/17.  History; this is a 9 H old lady with a history of type II-diabetes who developed chest pain radiating to her jaw. EKG showed diffuse ST depression and she was taken to the Cath Lab. Catheterization revealed severe 3 severe three-vessel coronary artery disease with left main involvement. She was taken to the operating room for emergency bypass grafting. Postoperatively she developed rapid atrial fibrillation treated with IV amiodarone, thrombocytopenia, transfusion for acute postoperative blood loss anemia, leukocytosis, pulmonary edema. There was some concern about PE however because of her renal insufficiency she did not receive a CT scan. She was put on Lasix 40 twice a day  Since arrival in our facility she remains on oxygen. She was not on oxygen at home. Her post op echocardiogram showed a normal EF with moderate concentric hypertrophy.  BMP Latest Ref Rng & Units 03/05/2017 03/02/2017 03/01/2017  Glucose 65 - 99 mg/dL 316(H) 144(H) 82  BUN 6 - 20 mg/dL 23(H) 21(H) 23(H)  Creatinine 0.44 - 1.00 mg/dL 1.68(H) 1.40(H) 1.48(H)  Sodium 135 - 145 mmol/L 139 143 146(H)  Potassium 3.5 - 5.1 mmol/L 4.3 3.6 3.7  Chloride 101 - 111 mmol/L 95(L) 97(L) 100(L)  CO2 22 - 32 mmol/L 35(H) 36(H) 37(H)  Calcium 8.9 - 10.3 mg/dL 9.0 8.6(L) 8.6(L)    CBC Latest Ref Rng & Units 03/05/2017 03/02/2017 02/27/2017  WBC 4.0 - 10.5 K/uL 12.4(H) 14.9(H) 17.5(H)  Hemoglobin 12.0 - 15.0 g/dL 10.5(L) 8.5(L) 8.4(L)  Hematocrit 36.0 - 46.0 % 33.3(L) 27.8(L) 26.4(L)  Platelets 150 - 400 K/uL 544(H) 460(H) 409(H)   BNP (last 3 results)  Recent Labs  02/16/17 0242 02/25/17 1128 02/27/17 0430  BNP 176.0* 908.6* 1,019.4*    ProBNP (last 3 results) No results for input(s): PROBNP in the last 8760 hours.   Estimated Creatinine Clearance: 26.1 mL/min (by C-G formula based on SCr of 1.68 mg/dL  (H)).   Past Medical History:  Diagnosis Date  . Aortic stenosis    Minimal  . Bradycardia   . Chronic kidney disease, stage III (moderate)   . Degenerative joint disease   . Diabetes mellitus type II   . Essential hypertension, benign   . Hyperlipidemia   . Lower extremity edema   . Microcytic anemia   . Palpitations     Past Surgical History:  Procedure Laterality Date  . COLONOSCOPY  08/28/2011   sigmoid colon serated adenoma, next colonoscopy 08/2016  . COLONOSCOPY W/ POLYPECTOMY  2005   Dr. Tamela Oddi resected per patient  . CORONARY ARTERY BYPASS GRAFT N/A 02/16/2017   Procedure: CORONARY ARTERY BYPASS GRAFTING (CABG) x 3 (LIMA to LAD, SVG SEQUENTIALLY to RAMUS INTERMEDIATE and DISTAL CIRCUMFLEX) WITH ENDOSCOPIC HARVESTING OF RIGHT SAPHENOUS VEIN;  Surgeon: Grace Isaac, MD;  Location: [facility];  Service: Open Heart Surgery;  Laterality: N/A;  . ESOPHAGOGASTRODUODENOSCOPY     remote, foreign body extraction  . ESOPHAGOGASTRODUODENOSCOPY  08/28/2011   hiatal hernia, erosion on fold of diaphragmatic hiatus (cameron lesion), antral and prepylori erosions, SB bx negative, gastric bx showed reactive gastropathy but no H.Pylori  . HEMORROIDECTOMY    . LEFT HEART CATH AND CORONARY ANGIOGRAPHY N/A 02/16/2017   Procedure: Left Heart Cath and Coronary Angiography;  Surgeon: Burnell Blanks, MD;  Location: [facility];  Service: Cardiovascular;  Laterality: N/A;  . TONSILLECTOMY    . TUBAL LIGATION  Current Outpatient Prescriptions on File Prior to Visit  Medication Sig Dispense Refill  . acetaminophen (TYLENOL) 325 MG tablet Take 2 tablets (650 mg total) by mouth every 6 (six) hours as needed for mild pain.    Marland Kitchen amiodarone (PACERONE) 200 MG tablet Take 1 tablet (200 mg total) by mouth daily.    Marland Kitchen amLODipine (NORVASC) 10 MG tablet Take 1 tablet (10 mg total) by mouth daily.    Marland Kitchen aspirin 81 MG tablet Take 81 mg by mouth daily.      . cephALEXin (KEFLEX) 500 MG  capsule Take 1 capsule (500 mg total) by mouth every 8 (eight) hours. For 7 Days    . cloNIDine (CATAPRES) 0.2 MG tablet Take 1 tablet (0.2 mg total) by mouth 2 (two) times daily.    . furosemide (LASIX) 40 MG tablet Take 1 tablet (40 mg total) by mouth 2 (two) times daily. 30 tablet no rpi\  . glipiZIDE (GLIPIZIDE XL) 5 MG 24 hr tablet Take 1 tablet (5 mg total) by mouth daily with breakfast. Do not take this medication if your blood sugar is less than 110.    . Linagliptin-Metformin HCl (JENTADUETO) 2.5-500 MG TABS Take 1 tablet by mouth 2 (two) times daily. Do not take this medication if your blood sugar is less than 110.    Marland Kitchen potassium chloride SA (K-DUR,KLOR-CON) 20 MEQ tablet Take 2 tablets (40 mEq total) by mouth daily.    . simvastatin (ZOCOR) 20 MG tablet TAKE (1) TABLET BY MOUTH ONCE DAILY AT BEDTIME FOR CHOLESTEROL. 90 tablet 3  . traMADol (ULTRAM) 50 MG tablet Take 1 tablet (50 mg total) by mouth every 6 (six) hours as needed for severe pain. 30 tablet 0    Social history; patient tells me she lives outside of [place]. She does not live alone. Does not use ambulatory assist devices and was not on oxygen.  reports that she has never smoked. She has never used smokeless tobacco. She reports that she does not drink alcohol or use drugs.  family history includes Coronary artery disease in her sister; Heart attack in her brother; Hypertension in her father.  Review of Systems; Gen. weight is gone from 172.4 pounds to 175 pounds in the last 3 days There were no vitals filed for this visit. Respiratory; some soreness of breath Cardiac no chest pain GI constipation. Episodic nausea GU no dysuria Endocrine CBGs are running to 82-301 fasting worse than that during the day. This is bordering on insulin Musculoskeletal; patient states she is up walking with therapy. No pain no chest pain but some shortness of breath. Skin states her surgical incision is healing well. Vascular; no  complaints of claudication. Mental status no abnormalities no depression  Physical examination Gen. patient has 2 L of oxygen satting 93% she does not appear dyspneic. Vitals; O2 sat is 93% on 2 L respirations 18 and unlabored pulse rate is 60 and regular HEENT oral exam is normal Respiratory by basilar crackles what sounds like an effusion at the right base. No wheezing work of breathing is normal Cardiac soft 2/6 midsystolic murmur does not radiate sounds benign. Jugular venous pressure is elevated Abdomen; somewhat distended no liver no spleen no masses GU no bladder distention or tenderness no CVA tenderness Extremities; minimal edema. No signs of a DVT.  Impression/plan #1 probably fluid overloaded on Lasix 40 twice a day. I looked at her chest x-ray 38 showing pulmonary edema. She certainly sounds like that now. There might of  been post thoracotomy changes in the left however she is bronchial on the right. She'll need a chest x-ray tomorrow and increase Lasix. #2 status post non-ST elevation MI an emergent CABG. She is not complaining of chest pain or exertional chest symptoms. #3 constipation #4 atrial fibrillation on amiodarone she feels fairly regular at the moment. #5 type 2 diabetes poorly controlled with stage IV chronic renal failure.  I'll increase her glipizide XL to 10 mg. She is also on jentadueto. Not sure about increasing the metformin in somebody with stage IV chronic renal failure. Hemoglobin A1c was 6 on 02/21/17

## 2017-03-11 ENCOUNTER — Encounter (HOSPITAL_COMMUNITY)
Admission: RE | Admit: 2017-03-11 | Discharge: 2017-03-11 | Disposition: A | Discharge status: Home / Self Care | Payer: Medicare Other | Source: Skilled Nursing Facility | Attending: Internal Medicine | Admitting: Internal Medicine

## 2017-03-11 LAB — BASIC METABOLIC PANEL
ANION GAP: 10 (ref 5–15)
BUN: 21 mg/dL — ABNORMAL HIGH (ref 6–20)
CHLORIDE: 103 mmol/L (ref 101–111)
CO2: 31 mmol/L (ref 22–32)
CREATININE: 1.47 mg/dL — AB (ref 0.44–1.00)
Calcium: 9.7 mg/dL (ref 8.9–10.3)
GFR calc non Af Amer: 33 mL/min — ABNORMAL LOW (ref >60)
GFR, EST AFRICAN AMERICAN: 38 mL/min — AB (ref >60)
GLUCOSE: 79 mg/dL (ref 65–99)
Potassium: 4 mmol/L (ref 3.5–5.1)
Sodium: 144 mmol/L (ref 135–145)

## 2017-03-13 ENCOUNTER — Encounter (HOSPITAL_COMMUNITY)
Admission: RE | Admit: 2017-03-13 | Discharge: 2017-03-13 | Disposition: A | Discharge status: Home / Self Care | Payer: Medicare Other | Source: Skilled Nursing Facility | Attending: Internal Medicine | Admitting: Internal Medicine

## 2017-03-13 LAB — BASIC METABOLIC PANEL
ANION GAP: 8 (ref 5–15)
BUN: 22 mg/dL — ABNORMAL HIGH (ref 6–20)
CALCIUM: 9.3 mg/dL (ref 8.9–10.3)
CO2: 31 mmol/L (ref 22–32)
CREATININE: 1.46 mg/dL — AB (ref 0.44–1.00)
Chloride: 100 mmol/L — ABNORMAL LOW (ref 101–111)
GFR, EST AFRICAN AMERICAN: 39 mL/min — AB (ref >60)
GFR, EST NON AFRICAN AMERICAN: 33 mL/min — AB (ref >60)
Glucose, Bld: 176 mg/dL — ABNORMAL HIGH (ref 65–99)
Potassium: 4.2 mmol/L (ref 3.5–5.1)
SODIUM: 139 mmol/L (ref 135–145)

## 2017-03-14 ENCOUNTER — Encounter: Payer: Self-pay | Admitting: Physician Assistant

## 2017-03-14 ENCOUNTER — Ambulatory Visit (INDEPENDENT_AMBULATORY_CARE_PROVIDER_SITE_OTHER): Payer: Medicare Other | Admitting: Physician Assistant

## 2017-03-14 ENCOUNTER — Encounter: Payer: Medicare Other | Admitting: Adult Health

## 2017-03-14 VITALS — BP 128/60 | HR 62 | Ht 61.0 in | Wt 173.0 lb

## 2017-03-14 DIAGNOSIS — I480 Paroxysmal atrial fibrillation: Secondary | ICD-10-CM | POA: Diagnosis not present

## 2017-03-14 DIAGNOSIS — I2510 Atherosclerotic heart disease of native coronary artery without angina pectoris: Secondary | ICD-10-CM | POA: Diagnosis not present

## 2017-03-14 DIAGNOSIS — I10 Essential (primary) hypertension: Secondary | ICD-10-CM | POA: Diagnosis not present

## 2017-03-14 DIAGNOSIS — N183 Chronic kidney disease, stage 3 unspecified: Secondary | ICD-10-CM

## 2017-03-14 DIAGNOSIS — I5032 Chronic diastolic (congestive) heart failure: Secondary | ICD-10-CM | POA: Diagnosis not present

## 2017-03-14 DIAGNOSIS — I359 Nonrheumatic aortic valve disorder, unspecified: Secondary | ICD-10-CM | POA: Diagnosis not present

## 2017-03-14 NOTE — Progress Notes (Signed)
Cardiology Office Note    Date:  03/14/2017   ID:  Stephanie Hendricks, DOB 1941/12/23, MRN [id number]  PCP:  Wende Neighbors, MD  Cardiologist: Dr. Domenic Polite  Chief Complaint  Patient presents with  . Follow-up    History of Present Illness:  Stephanie Hendricks is a 76 y.o. female with history of NSTEMI who underwent CABG 3 on 02/16/17. She also had acute on chronic diastolic CHF treated with IV Lasix. 2-D echo 02/21/17 EF 65-70% with grade 1 DD. She had atrial fibrillation treated with amiodarone and had remained in normal sinus rhythm. Plan is to stop the amiodarone if no recurrent atrial fibrillation after 30 days. No need for anticoagulation unless she has recurrence. 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 She was sent to SNF for PT/OT. Made 173 pounds at discharge.  Labs on 03/13/17 creatinine 1.46. Lasix increased at SNF for weight gain of 3 lbs and short of breath 03/06/17.Lasix was increased to 60 mg by mouth twice a day. She is also on oxygen. She comes in today feeling quite well. She denies any chest pain. Her daughter says when she walks sometimes her O2 sats dropped to 87% but she feels good on oxygen. She has mild edema but overall denies any significant shortness of breath on the oxygen. No palpitations. Her incision sometimes get irritated and she's put Neosporin on them.     Past Medical History:  Diagnosis Date  . Aortic stenosis    Minimal  . Bradycardia   . Chronic kidney disease, stage III (moderate)   . Degenerative joint disease   . Diabetes mellitus type II   . Essential hypertension, benign   . Hyperlipidemia   . Lower extremity edema   . Microcytic anemia   . Palpitations     Past Surgical History:  Procedure Laterality Date  . COLONOSCOPY  08/28/2011   sigmoid colon serated adenoma, next colonoscopy 08/2016  . COLONOSCOPY W/ POLYPECTOMY  2005   Dr. Tamela Oddi  resected per patient  . CORONARY ARTERY BYPASS GRAFT N/A 02/16/2017   Procedure: CORONARY ARTERY BYPASS GRAFTING (CABG) x 3 (LIMA to LAD, SVG SEQUENTIALLY to RAMUS INTERMEDIATE and DISTAL CIRCUMFLEX) WITH ENDOSCOPIC HARVESTING OF RIGHT SAPHENOUS VEIN;  Surgeon: Grace Isaac, MD;  [facility];  Service: Open Heart Surgery;  Laterality: N/A;  . ESOPHAGOGASTRODUODENOSCOPY     remote, foreign body extraction  . ESOPHAGOGASTRODUODENOSCOPY  08/28/2011   hiatal hernia, erosion on fold of diaphragmatic hiatus (cameron lesion), antral and prepylori erosions, SB bx negative, gastric bx showed reactive gastropathy but no H.Pylori  . HEMORROIDECTOMY    . LEFT HEART CATH AND CORONARY ANGIOGRAPHY N/A 02/16/2017   Procedure: Left Heart Cath and Coronary Angiography;  Surgeon: Burnell Blanks, MD;  Location: [facility];  Service: Cardiovascular;  Laterality: N/A;  . TONSILLECTOMY    . TUBAL LIGATION      Current Medications: Outpatient Medications Prior to Visit  Medication Sig Dispense Refill  . acetaminophen (TYLENOL) 325 MG tablet Take 2 tablets (650 mg total) by mouth every 6 (six) hours as needed for mild pain.    Marland Kitchen amiodarone (PACERONE) 200 MG tablet Take 1 tablet (200 mg total) by mouth daily.    Marland Kitchen amLODipine (NORVASC) 10 MG tablet Take 1 tablet (10 mg total) by mouth daily.    Marland Kitchen aspirin 81 MG tablet Take 81 mg by mouth daily.      Marland Kitchen  cloNIDine (CATAPRES) 0.2 MG tablet Take 1 tablet (0.2 mg total) by mouth 2 (two) times daily.    . furosemide (LASIX) 40 MG tablet Take 1 tablet (40 mg total) by mouth 2 (two) times daily. (Patient taking differently: Take 60 mg by mouth 2 (two) times daily. ) 30 tablet no rpi\  . glipiZIDE (GLIPIZIDE XL) 5 MG 24 hr tablet Take 1 tablet (5 mg total) by mouth daily with breakfast. Do not take this medication if your blood sugar is less than 110.    . Linagliptin-Metformin HCl (JENTADUETO) 2.5-500 MG TABS Take 1 tablet by mouth 2 (two) times daily. Do  not take this medication if your blood sugar is less than 110.    Marland Kitchen potassium chloride SA (K-DUR,KLOR-CON) 20 MEQ tablet Take 2 tablets (40 mEq total) by mouth daily.    . simvastatin (ZOCOR) 20 MG tablet TAKE (1) TABLET BY MOUTH ONCE DAILY AT BEDTIME FOR CHOLESTEROL. 90 tablet 3  . traMADol (ULTRAM) 50 MG tablet Take 1 tablet (50 mg total) by mouth every 6 (six) hours as needed for severe pain. 30 tablet 0  . cephALEXin (KEFLEX) 500 MG capsule Take 1 capsule (500 mg total) by mouth every 8 (eight) hours. For 7 Days     No facility-administered medications prior to visit.      Allergies:   Patient has no known allergies.   Social History   Social History  . Marital status: Widowed    Spouse name: N/A  . Number of children: 2  . Years of education: N/A   Social History Main Topics  . Smoking status: Never Smoker  . Smokeless tobacco: Never Used  . Alcohol use No  . Drug use: No  . Sexual activity: Not Asked   Other Topics Concern  . None   Social History Narrative  . None     Family History:  The patient's   family history includes Coronary artery disease in her sister; Heart attack in her brother; Hypertension in her father.   ROS:   Please see the history of present illness.    Review of Systems  Constitution: Positive for weakness and malaise/fatigue.  HENT: Negative.   Eyes: Negative.   Cardiovascular: Positive for dyspnea on exertion and leg swelling.  Respiratory: Negative.   Hematologic/Lymphatic: Negative.   Musculoskeletal: Negative.  Negative for joint pain.  Gastrointestinal: Negative.   Genitourinary: Negative.    All other systems reviewed and are negative.   PHYSICAL EXAM:   VS:  BP 128/60   Pulse 62   Ht 5\' 1"  (1.549 m)   Wt 173 lb (78.5 kg)   SpO2 94%   BMI 32.69 kg/m   Physical Exam  GEN: Obese, in no acute distress  Neck: no JVD, carotid bruits, or masses Cardiac: Incisions healing well RRR; 2/6 systolic murmur at the left sternal border,  no rubs, or gallops  Respiratory: Decreased breath sounds at the bases bilaterally without rales or rhonchi GI: soft, nontender, nondistended, + BS Ext: Compression hose in place, +1 edema bilaterally Decreased distal pulses bilaterally Psych: euthymic mood, full affect  Wt Readings from Last 3 Encounters:  03/14/17 173 lb (78.5 kg)  03/02/17 172 lb 8 oz (78.2 kg)  12/31/16 180 lb (81.6 kg)      Studies/Labs Reviewed:   EKG:  EKG is ordered today.  The ekg ordered today demonstrates Normal sinus rhythm at 60 bpm  Recent Labs: 02/16/2017: TSH 0.785 02/27/2017: B Natriuretic Peptide 1,019.4 03/01/2017: Magnesium  1.9 03/05/2017: ALT 72; Hemoglobin 10.5; Platelets 544 03/13/2017: BUN 22; Creatinine, Ser 1.46; Potassium 4.2; Sodium 139   Lipid Panel    Component Value Date/Time   CHOL 70 02/16/2017 1300   TRIG 62 02/16/2017 1300   HDL 22 (L) 02/16/2017 1300   CHOLHDL 3.2 02/16/2017 1300   VLDL 12 02/16/2017 1300   LDLCALC 36 02/16/2017 1300    Additional studies/ records that were reviewed today include:   2-D echo 3/1/18Study Conclusions   - Left ventricle: The cavity size was normal. There was moderate   concentric hypertrophy. Systolic function was vigorous. The   estimated ejection fraction was in the range of 65% to 70%. Wall   motion was normal; there were no regional wall motion   abnormalities. Doppler parameters are consistent with abnormal   left ventricular relaxation (grade 1 diastolic dysfunction). - Aortic valve: There was mild stenosis. Peak velocity (S): 249   cm/s. Mean gradient (S): 12 mm Hg. Valve area (VTI): 1.65 cm^2.   Valve area (Vmax): 1.56 cm^2. Valve area (Vmean): 1.64 cm^2. - Left atrium: The atrium was mildly dilated. Volume/bsa, S: 35   ml/m^2. - Tricuspid valve: There was moderate regurgitation. - Pulmonary arteries: Systolic pressure was mildly increased.  Cardiac catheterization 2/24/18Conclusion     Prox RCA to Mid RCA lesion, 10  %stenosed.  Ost LM to LM lesion, 99 %stenosed.  Ost Cx to Prox Cx lesion, 50 %stenosed.  Ost 1st Mrg to 1st Mrg lesion, 90 %stenosed.  Prox LAD lesion, 80 %stenosed.   1. Unstable angina/NSTEMI 2. Severe distal left main artery stenosis 3. Severe stenosis proximal LAD 4. Severe stenosis ostium OM1 5. Elevated LVEDP   Recommendations: Emergent CABG. IABP in place. Dr. Servando Snare is present in the cath lab.        ASSESSMENT:    1. Coronary artery disease involving native coronary artery of native heart without angina pectoris   2. Paroxysmal atrial fibrillation (HCC) post op CABG   3. Chronic diastolic CHF (congestive heart failure) ([place])   4. Essential hypertension   5. Chronic kidney disease (CKD), stage III (moderate)   6. Aortic valve disorder      PLAN:  In order of problems listed above:  CAD status post CABG 3 02/16/17 with deconditioning now receiving PT and OT at the nursing home. Incisions healing well. Continue aspirin and Zocor  Postop atrial fibrillation treated with amiodarone. Plan was to stop after one month. We'll stop on 03/20/17. Patient is in normal sinus rhythm today. She had bradycardia on beta blockers in the hospital and they were stopped. Follow-up with Dr. Domenic Polite in 2 months  Chronic diastolic CHF Lasix increased at the nursing facility to 60 mg twice a day. She is compensated today. Would continue this dose. Renal function checked yesterday and was stable.  Essential hypertension controlled with clonidine and amlodipine  CK D stable on labs yesterday  Aortic valve disorder with mild aortic stenosis on 2-D echo    Medication Adjustments/Labs and Tests Ordered: Current medicines are reviewed at length with the patient today.  Concerns regarding medicines are outlined above.  Medication changes, Labs and Tests ordered today are listed in the Patient Instructions below. Patient Instructions  Your physician recommends that you schedule a  follow-up appointment in: 2 Months with Dr. Domenic Polite.   Your physician recommends that you continue on your current medications as directed. Please refer to the Current Medication list given to you today.  STOP Taking Amiodarone on March 20, 2017  If you need a refill on your cardiac medications before your next appointment, please call your pharmacy.  Thank you for choosing [facility]!       Signed, Ermalinda Barrios, PA-C  03/14/2017 2:12 PM    [facility] [address], [place], [place]  [address] Phone: [phone]; Fax: [phone]

## 2017-03-14 NOTE — Patient Instructions (Signed)
Your physician recommends that you schedule a follow-up appointment in: 2 Months with Dr. Domenic Polite.   Your physician recommends that you continue on your current medications as directed. Please refer to the Current Medication list given to you today.  STOP Taking Amiodarone on March 20, 2017  If you need a refill on your cardiac medications before your next appointment, please call your pharmacy.  Thank you for choosing [facility]!

## 2017-03-19 ENCOUNTER — Encounter: Payer: Self-pay | Admitting: Internal Medicine

## 2017-03-19 ENCOUNTER — Non-Acute Institutional Stay (SKILLED_NURSING_FACILITY): Payer: Medicare Other | Admitting: Internal Medicine

## 2017-03-19 DIAGNOSIS — E1121 Type 2 diabetes mellitus with diabetic nephropathy: Secondary | ICD-10-CM | POA: Diagnosis not present

## 2017-03-19 DIAGNOSIS — N898 Other specified noninflammatory disorders of vagina: Secondary | ICD-10-CM

## 2017-03-19 DIAGNOSIS — I5032 Chronic diastolic (congestive) heart failure: Secondary | ICD-10-CM

## 2017-03-19 DIAGNOSIS — Z951 Presence of aortocoronary bypass graft: Secondary | ICD-10-CM

## 2017-03-19 DIAGNOSIS — N183 Chronic kidney disease, stage 3 unspecified: Secondary | ICD-10-CM

## 2017-03-19 DIAGNOSIS — I480 Paroxysmal atrial fibrillation: Secondary | ICD-10-CM

## 2017-03-19 NOTE — Progress Notes (Signed)
Location:   [facility] Room Number: 150/P Place of Service:  SNF (31) Provider:  Marylen Ponto, MD  Patient Care Team: Celene Squibb, MD as PCP - General (Internal Medicine) Danie Binder, MD (Gastroenterology) Yehuda Savannah, MD (Cardiology)  Extended Emergency Contact Information Primary Emergency Contact: [facility] Address: [address]          [place], [place] [address] [place] Phone: [phone] Relation: Son  Code Status:  Full Code Goals of care: Advanced Directive information Advanced Directives 03/19/2017  Does Patient Have a Medical Advance Directive? Yes  Type of Advance Directive (No Data)  Does patient want to make changes to medical advance directive? No - Patient declined  Would patient like information on creating a medical advance directive? No - Patient declined     Chief Complaint  Patient presents with  . Acute Visit    patients c/o Vaginal discharge    HPI:  Pt is a 76 y.o. female seen today for an acute visit for Vaginal discharge and her daughter wanted to talk about her lasix dose. 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 Her post op course was complicated by CHF Diastolic treated with Lasix and Atrial fibrillation converted to sinu rhythm with Amiodarone. She also had AKD and Sinus Bradycardia. Both resolved. Patient today was c/o Vaginal discharge. She says she had noticed some orange color vaginal discharge with no odor. She doesn't;t remember seeing that before. Denies any pain or itching. Her daughter was also concerned about the dose of lasix. She wanted to make sure her renal function is fine. Patient denies any dysuria. Just increased frequency. Denies any weakness or dizziness. No cough  She still get SOB on exertion requiring oxygen supplement.  Past Medical History:  Diagnosis Date  . Aortic stenosis    Minimal  . Bradycardia   . Chronic  kidney disease, stage III (moderate)   . Degenerative joint disease   . Diabetes mellitus type II   . Essential hypertension, benign   . Hyperlipidemia   . Lower extremity edema   . Microcytic anemia   . Palpitations    Past Surgical History:  Procedure Laterality Date  . COLONOSCOPY  08/28/2011   sigmoid colon serated adenoma, next colonoscopy 08/2016  . COLONOSCOPY W/ POLYPECTOMY  2005   Dr. Tamela Oddi resected per patient  . CORONARY ARTERY BYPASS GRAFT N/A 02/16/2017   Procedure: CORONARY ARTERY BYPASS GRAFTING (CABG) x 3 (LIMA to LAD, SVG SEQUENTIALLY to RAMUS INTERMEDIATE and DISTAL CIRCUMFLEX) WITH ENDOSCOPIC HARVESTING OF RIGHT SAPHENOUS VEIN;  Surgeon: Grace Isaac, MD;  Location: [facility];  Service: Open Heart Surgery;  Laterality: N/A;  . ESOPHAGOGASTRODUODENOSCOPY     remote, foreign body extraction  . ESOPHAGOGASTRODUODENOSCOPY  08/28/2011   hiatal hernia, erosion on fold of diaphragmatic hiatus (cameron lesion), antral and prepylori erosions, SB bx negative, gastric bx showed reactive gastropathy but no H.Pylori  . HEMORROIDECTOMY    . LEFT HEART CATH AND CORONARY ANGIOGRAPHY N/A 02/16/2017   Procedure: Left Heart Cath and Coronary Angiography;  Surgeon: Burnell Blanks, MD;  Location: [facility];  Service: Cardiovascular;  Laterality: N/A;  . TONSILLECTOMY    . TUBAL LIGATION      No Known Allergies  Current Outpatient Prescriptions on File Prior to Visit  Medication Sig Dispense Refill  . acetaminophen (TYLENOL) 325 MG tablet Take 2 tablets (650 mg total) by mouth  every 6 (six) hours as needed for mild pain.    Marland Kitchen amiodarone (PACERONE) 200 MG tablet Take 1 tablet (200 mg total) by mouth daily.    Marland Kitchen amLODipine (NORVASC) 10 MG tablet Take 1 tablet (10 mg total) by mouth daily.    Marland Kitchen aspirin 81 MG tablet Take 81 mg by mouth daily.      . cloNIDine (CATAPRES) 0.2 MG tablet Take 1 tablet (0.2 mg total) by mouth 2 (two) times daily.    Marland Kitchen glipiZIDE (GLIPIZIDE  XL) 5 MG 24 hr tablet Take 1 tablet (5 mg total) by mouth daily with breakfast. Do not take this medication if your blood sugar is less than 110.    . Linagliptin-Metformin HCl (JENTADUETO) 2.5-500 MG TABS Take 1 tablet by mouth 2 (two) times daily. Do not take this medication if your blood sugar is less than 110.    Marland Kitchen potassium chloride SA (K-DUR,KLOR-CON) 20 MEQ tablet Take 2 tablets (40 mEq total) by mouth daily.    . simvastatin (ZOCOR) 20 MG tablet TAKE (1) TABLET BY MOUTH ONCE DAILY AT BEDTIME FOR CHOLESTEROL. 90 tablet 3  . traMADol (ULTRAM) 50 MG tablet Take 1 tablet (50 mg total) by mouth every 6 (six) hours as needed for severe pain. 30 tablet 0   No current facility-administered medications on file prior to visit.      Review of Systems  Review of Systems  Constitutional: Negative for activity change, appetite change, chills, diaphoresis, fatigue and fever.  HENT: Negative for mouth sores, postnasal drip, rhinorrhea, sinus pain and sore throat.   Respiratory: Negative for apnea, cough, chest tightness, shortness of breath and wheezing.   Cardiovascular: Negative for chest pain, palpitations and leg swelling.  Gastrointestinal: Negative for abdominal distention, abdominal pain, constipation, diarrhea, nausea and vomiting.  Genitourinary: Negative for dysuria   Musculoskeletal: Negative for arthralgias, joint swelling and myalgias.  Skin: Negative for rash.  Neurological: Negative for dizziness, syncope, weakness, light-headedness and numbness.  Psychiatric/Behavioral: Negative for behavioral problems, confusion and sleep disturbance.     Immunization History  Administered Date(s) Administered  . Influenza-Unspecified 08/24/2013   Pertinent  Health Maintenance Due  Topic Date Due  . FOOT EXAM  01/22/1951  . OPHTHALMOLOGY EXAM  01/22/1951  . URINE MICROALBUMIN  01/22/1951  . INFLUENZA VACCINE  11/23/2017 (Originally 07/24/2016)  . PNA vac Low Risk Adult (1 of 2 - PCV13)  11/23/2017 (Originally 01/23/2004)  . HEMOGLOBIN A1C  08/24/2017  . DEXA SCAN  Completed   Fall Risk  11/06/2014  Falls in the past year? No   Functional Status Survey:    Vitals:   03/19/17 1328  BP: (!) 154/63  Pulse: 61  Resp: 20  Temp: 99 F (37.2 C)  TempSrc: Oral  SpO2: 94%   There is no height or weight on file to calculate BMI. Physical Exam  Constitutional: She appears well-developed and well-nourished.  HENT:  Head: Normocephalic.  Mouth/Throat: Oropharynx is clear and moist.  Eyes: Pupils are equal, round, and reactive to light.  Cardiovascular: Normal rate, regular rhythm and normal heart sounds.   No murmur heard. Pulmonary/Chest: Effort normal and breath sounds normal.  Few Crackles in both lower base.  Abdominal: Soft. Bowel sounds are normal. She exhibits no distension. There is no tenderness. There is no rebound and no guarding.  Genitourinary: There is no rash or tenderness on the right labia. There is no rash or tenderness on the left labia. No erythema in the vagina. No vaginal  discharge found.  Genitourinary Comments: Vaginal exam did not show any signs of redness or discharge.  Musculoskeletal:  Mild edema b/l    Labs reviewed:  Recent Labs  02/17/17 0330 02/17/17 1604  03/01/17 0425  03/05/17 1015 03/11/17 0700 03/13/17 0700  NA 140  --   < > 146*  < > 139 144 139  K 4.9  --   < > 3.7  < > 4.3 4.0 4.2  CL 111  --   < > 100*  < > 95* 103 100*  CO2 22  --   < > 37*  < > 35* 31 31  GLUCOSE 98  --   < > 82  < > 316* 79 176*  BUN 12  --   < > 23*  < > 23* 21* 22*  CREATININE 1.14* 1.30*  < > 1.48*  < > 1.68* 1.47* 1.46*  CALCIUM 7.7*  --   < > 8.6*  < > 9.0 9.7 9.3  MG 2.4 2.4  --  1.9  --   --   --   --   < > = values in this interval not displayed.  Recent Labs  03/05/17 1015  AST 41  ALT 72*  ALKPHOS 102  BILITOT 0.5  PROT 8.1  ALBUMIN 3.1*    Recent Labs  02/16/17 0242  02/27/17 0430 03/02/17 0456 03/05/17 1015  WBC  12.4*  < > 17.5* 14.9* 12.4*  NEUTROABS 3.0  --   --   --  9.1*  HGB 11.2*  < > 8.4* 8.5* 10.5*  HCT 35.8*  < > 26.4* 27.8* 33.3*  MCV 78.5  < > 79.8 82.5 84.3  PLT 257  < > 409* 460* 544*  < > = values in this interval not displayed. Lab Results  Component Value Date   TSH 0.785 02/16/2017   Lab Results  Component Value Date   HGBA1C 6.0 (H) 02/21/2017   Lab Results  Component Value Date   CHOL 70 02/16/2017   HDL 22 (L) 02/16/2017   LDLCALC 36 02/16/2017   TRIG 62 02/16/2017   CHOLHDL 3.2 02/16/2017    Significant Diagnostic Results in last 30 days:  Dg Chest 2 View  Result Date: 02/25/2017 CLINICAL DATA:  Shortness of breath, status post CABG on February 16, 2017 EXAM: CHEST  2 VIEW COMPARISON:  PA and lateral chest x-ray of February 23, 2017. FINDINGS: The lungs are hypoinflated. Persistent bibasilar densities are present and on the left are slightly more conspicuous. The right hemidiaphragm remains obscured. The cardiac silhouette is enlarged. The pulmonary vascularity is engorged. The PICC line tip projects over the cavoatrial junction. Surgical wires are intact. Bilateral pleural effusions have increased slightly. IMPRESSION: CHF with pulmonary interstitial edema and bilateral pleural effusions. Bibasilar atelectasis. Overall the appearance of the chest has deteriorated slightly with slight worsening of interstitial edema. Electronically Signed   By: David  Martinique M.D.   On: 02/25/2017 09:45   Dg Chest 2 View  Result Date: 02/23/2017 CLINICAL DATA:  Status post coronary artery bypass graft. EXAM: CHEST  2 VIEW COMPARISON:  Radiograph February 21, 2017. FINDINGS: Stable cardiomediastinal silhouette. Sternotomy wires are noted. No pneumothorax is noted. Right-sided PICC line is unchanged in position. Stable bilateral basilar opacities are noted concerning for atelectasis or edema with associated pleural effusions, right greater than left. Bony thorax is unremarkable. IMPRESSION: Stable  bibasilar opacities as described above. Electronically Signed   By: Marijo Conception, M.D.  On: 02/23/2017 09:07   Dg Chest Port 1 View  Result Date: 02/28/2017 CLINICAL DATA:  CHF EXAM: PORTABLE CHEST 1 VIEW COMPARISON:  02/27/2017 FINDINGS: Cardiac enlargement. Mild progression of vascular congestion and edema. Mild progression of bilateral pleural effusions and bibasilar atelectasis. Central line remains in the lower SVC. IMPRESSION: Congestive heart failure with mild progression. Electronically Signed   By: Franchot Gallo M.D.   On: 02/28/2017 08:48   Dg Chest Port 1 View  Result Date: 02/27/2017 CLINICAL DATA:  Status post CABG on February 16, 2017 EXAM: PORTABLE CHEST 1 VIEW COMPARISON:  PA and lateral chest x-ray of February 25, 2017 FINDINGS: The lungs are slightly better inflated today. Bibasilar densities. Bilateral pleural effusions greatest on the right are present and stable. The cardiac silhouette is mildly enlarged. The left heart border remains largely obscured. The central pulmonary vascularity is prominent but less engorged. There is calcification in the wall of the aortic arch. The sternal wires are intact. The right sided PICC line tip projects over the cavoatrial junction. IMPRESSION: Interval decrease in pulmonary interstitial edema consistent with improving CHF. Bibasilar atelectasis and pleural effusions are fairly stable. Thoracic aortic atherosclerosis. Electronically Signed   By: David  Martinique M.D.   On: 02/27/2017 07:33   Dg Chest Port 1 View  Result Date: 02/21/2017 CLINICAL DATA:  Pneumothorax. EXAM: PORTABLE CHEST 1 VIEW COMPARISON:  02/20/2017. FINDINGS: Right IJ sheath in stable position. Right PICC line noted with tip projected over right atrium. Prior CABG. Cardiomegaly with bilateral from interstitial prominence and bilateral pleural effusions. Findings consistent CHF. No pneumothorax . IMPRESSION: 1. Right IJ sheath noted in stable position. Interim placement of right PICC  line, its tip is over the right atrium. 2. Prior CABG. Cardiomegaly with slight progression of bilateral pulmonary interstitial prominence and bilateral pleural effusions consistent with progressive CHF . Electronically Signed   By: Marcello Moores  Register   On: 02/21/2017 07:41   Dg Chest Port 1 View  Result Date: 02/20/2017 CLINICAL DATA:  Status post CABG 4 days ago. EXAM: PORTABLE CHEST 1 VIEW COMPARISON:  Portable chest x-ray of February 19, 2017. FINDINGS: Hypoinflation has not worsened since extubation of the trachea. There is bibasilar atelectasis. There is a moderate sized right pleural effusion layering posteriorly. The left chest tube is in stable position with the tip overlying the posterolateral aspect of the fourth rib. There is no pneumothorax. The cardiac silhouette remains enlarged. The pulmonary vascularity is slightly less engorged. There is calcification in the wall of the aortic arch. The Swan-Ganz catheter has been removed as has the esophagogastric tube and the lower left chest tube. IMPRESSION: Persistent hypoinflation with bibasilar atelectasis. Moderate size right pleural effusion. No pneumothorax. Stable cardiomegaly with slightly decreased pulmonary vascular congestion and pulmonary edema. Electronically Signed   By: David  Martinique M.D.   On: 02/20/2017 07:53   Dg Chest Port 1 View  Result Date: 02/19/2017 CLINICAL DATA:  Emergency CABG for critical LEFT coronary stenosis. EXAM: PORTABLE CHEST 1 VIEW COMPARISON:  02/18/2017. FINDINGS: Cardiomegaly. BILATERAL lower lobe opacities appear worse, possibly representing atelectasis, infiltrate, effusion, or edema. Chest and mediastinal tubes in good position without pneumothorax. Swan-Ganz catheter tip main pulmonary artery. ETT 12 mm above carina, could be pulled back 20-30 mm. I no longer see the intra aortic balloon pump. IMPRESSION: Status post emergency CABG. Tubes and lines as described. Worsening aeration. Electronically Signed   By:  Staci Righter M.D.   On: 02/19/2017 07:29   Dg Chest Port 1  View  Result Date: 02/18/2017 CLINICAL DATA:  CABG. EXAM: PORTABLE CHEST 1 VIEW COMPARISON:  02/17/2017. FINDINGS: Endotracheal tube, NG tube, Swan-Ganz catheter, left chest tube, mediastinal drainage catheter in stable position. Aortic balloon pump tip noted just below the aortic arch. Prior CABG. Cardiomegaly with diffuse bilateral mild interstitial prominence, improved from prior exam. Findings suggesting improving congestive heart failure . Small left pleural effusion. No pneumothorax. IMPRESSION: 1. Aortic balloon catheter tip noted just below the aortic arch. Remaining lines and tubes including left chest tube in stable position. No pneumothorax. 2. Prior CABG. Cardiomegaly. Persistent but improving interstitial prominence consistent with improving CHF. Electronically Signed   By: Marcello Moores  Register   On: 02/18/2017 07:02    Assessment/Plan Vaginal discharge Will check UA. At this time will hold treatment.  If discharge continues will empirically treat with Miconazole.  Chronic diastolic CHF  Patient was seen by Cardiologist and they continued Lasix 60 mg BID. Will continue same dose. Recheck Renal function.  Chronic kidney disease (CKD), stage III (moderate) Follow up with Renal function  Type II diabetes mellitus with nephropathy  BS running less then 200. Continue same dose of hypoglycemics.  Paroxysmal atrial fibrillation post op CABG Was taken off amiodarone. Continue to follow  S/P CABG x 3 Incision healed well Continue therapy.     Family/ staff Communication:   Labs/tests ordered:  CBC, BMP Taper oxygen supplement.

## 2017-03-20 ENCOUNTER — Encounter: Payer: Self-pay | Admitting: Internal Medicine

## 2017-03-20 ENCOUNTER — Encounter (HOSPITAL_COMMUNITY)
Admission: RE | Admit: 2017-03-20 | Discharge: 2017-03-20 | Disposition: A | Discharge status: Home / Self Care | Payer: Medicare Other | Source: Skilled Nursing Facility | Attending: Internal Medicine | Admitting: Internal Medicine

## 2017-03-20 ENCOUNTER — Non-Acute Institutional Stay (SKILLED_NURSING_FACILITY): Payer: Medicare Other | Admitting: Internal Medicine

## 2017-03-20 DIAGNOSIS — E1121 Type 2 diabetes mellitus with diabetic nephropathy: Secondary | ICD-10-CM

## 2017-03-20 DIAGNOSIS — I10 Essential (primary) hypertension: Secondary | ICD-10-CM | POA: Diagnosis not present

## 2017-03-20 DIAGNOSIS — I5032 Chronic diastolic (congestive) heart failure: Secondary | ICD-10-CM

## 2017-03-20 DIAGNOSIS — I480 Paroxysmal atrial fibrillation: Secondary | ICD-10-CM | POA: Diagnosis not present

## 2017-03-20 DIAGNOSIS — N183 Chronic kidney disease, stage 3 unspecified: Secondary | ICD-10-CM

## 2017-03-20 DIAGNOSIS — I2510 Atherosclerotic heart disease of native coronary artery without angina pectoris: Secondary | ICD-10-CM

## 2017-03-20 LAB — CBC WITH DIFFERENTIAL/PLATELET
BASOS ABS: 0 10*3/uL (ref 0.0–0.1)
BASOS PCT: 0 %
EOS ABS: 0.3 10*3/uL (ref 0.0–0.7)
EOS PCT: 3 %
HCT: 30.4 % — ABNORMAL LOW (ref 36.0–46.0)
HEMOGLOBIN: 9.3 g/dL — AB (ref 12.0–15.0)
LYMPHS ABS: 3.7 10*3/uL (ref 0.7–4.0)
Lymphocytes Relative: 36 %
MCH: 25.5 pg — AB (ref 26.0–34.0)
MCHC: 30.6 g/dL (ref 30.0–36.0)
MCV: 83.5 fL (ref 78.0–100.0)
Monocytes Absolute: 0.8 10*3/uL (ref 0.1–1.0)
Monocytes Relative: 7 %
NEUTROS PCT: 54 %
Neutro Abs: 5.5 10*3/uL (ref 1.7–7.7)
PLATELETS: 292 10*3/uL (ref 150–400)
RBC: 3.64 MIL/uL — AB (ref 3.87–5.11)
RDW: 16.2 % — AB (ref 11.5–15.5)
WBC: 10.2 10*3/uL (ref 4.0–10.5)

## 2017-03-20 LAB — COMPREHENSIVE METABOLIC PANEL
ALT: 23 U/L (ref 14–54)
AST: 18 U/L (ref 15–41)
Albumin: 2.9 g/dL — ABNORMAL LOW (ref 3.5–5.0)
Alkaline Phosphatase: 81 U/L (ref 38–126)
Anion gap: 9 (ref 5–15)
BUN: 21 mg/dL — ABNORMAL HIGH (ref 6–20)
CHLORIDE: 95 mmol/L — AB (ref 101–111)
CO2: 37 mmol/L — AB (ref 22–32)
Calcium: 9.3 mg/dL (ref 8.9–10.3)
Creatinine, Ser: 1.54 mg/dL — ABNORMAL HIGH (ref 0.44–1.00)
GFR calc Af Amer: 36 mL/min — ABNORMAL LOW (ref >60)
GFR, EST NON AFRICAN AMERICAN: 31 mL/min — AB (ref >60)
Glucose, Bld: 60 mg/dL — ABNORMAL LOW (ref 65–99)
POTASSIUM: 3.9 mmol/L (ref 3.5–5.1)
SODIUM: 141 mmol/L (ref 135–145)
Total Bilirubin: 0.4 mg/dL (ref 0.3–1.2)
Total Protein: 7.4 g/dL (ref 6.5–8.1)

## 2017-03-20 NOTE — Progress Notes (Signed)
Location:   [place] Room Number: 150/P Place of Service:  SNF (31)  Provider: Granville Lewis  PCP: Wende Neighbors, MD Patient Care Team: Celene Squibb, MD as PCP - General (Internal Medicine) Danie Binder, MD (Gastroenterology) Yehuda Savannah, MD (Cardiology)  Extended Emergency Contact Information Primary Emergency Contact: [facility] Address: [address]          [place],  [address] [place] Phone: [phone] Relation: Son  Code Status: Full Code Goals of care:  Advanced Directive information Advanced Directives 03/20/2017  Does Patient Have a Medical Advance Directive? Yes  Type of Advance Directive (No Data)  Does patient want to make changes to medical advance directive? No - Patient declined  Would patient like information on creating a medical advance directive? No - Patient declined     No Known Allergies  Chief Complaint  Patient presents with  . Discharge Note    HPI:  76 y.o. female  seen today for discharge from facility later this week. She's been here for rehabilitation after undergoing a CABG 3 with history of an NST EMI.  She also had acute on chronic like CHF treated with IV Lasix.  2-D echo early March showed ejection fraction 65-70%.   With grade 1 diastolic dysfunction.  She also had atrial fibrillation was treated with a meal around she has been tapered off this and actually is completing her amiodarone today.  Results there was no need for anticoagulation unless atrophia reoccurs.  She also had acute respiratory failure in the hospital and acute on chronic kidney disease and deconditioning.  She is been here for rehabilitation and actually is done quite well.  Her weight appears to be stable she is currently on Lasix 60 mg twice a day.  Renal function appears to be relatively stable with a creatinine 1.54 BUN of 21 on lab done today.  She was seen yesterday for possible vaginal  discharge but Dr. Lyndel Safe did not appreciate any drainage exam but a urine culture is pending.  She lives with family she will need oxygen C desaturates with exertion.  Will need PT and OT for strengthening as well as nursing support to help with her multiple medical issues.  She is a type II diabetic Accu-Cheks have been somewhat variable ranging from the 70s to lower 200s appears the morning her sugars are still in the 70s to low 100s later in the day more in the mid 100s area  She is on Jentadueto 2. 5-500 twice a day as well as lipids side extended release 5 mg every morning.        Past Medical History:  Diagnosis Date  . Aortic stenosis    Minimal  . Bradycardia   . Chronic kidney disease, stage III (moderate)   . Degenerative joint disease   . Diabetes mellitus type II   . Essential hypertension, benign   . Hyperlipidemia   . Lower extremity edema   . Microcytic anemia   . Palpitations     Past Surgical History:  Procedure Laterality Date  . COLONOSCOPY  08/28/2011   sigmoid colon serated adenoma, next colonoscopy 08/2016  . COLONOSCOPY W/ POLYPECTOMY  2005   Dr. Tamela Oddi resected per patient  . CORONARY ARTERY BYPASS GRAFT N/A 02/16/2017   Procedure: CORONARY ARTERY BYPASS GRAFTING (CABG) x 3 (LIMA to LAD, SVG SEQUENTIALLY to RAMUS INTERMEDIATE and DISTAL CIRCUMFLEX) WITH ENDOSCOPIC HARVESTING OF RIGHT SAPHENOUS VEIN;  Surgeon: Grace Isaac, MD;  Location: [facility] OR;  Service: Open Heart Surgery;  Laterality: N/A;  . ESOPHAGOGASTRODUODENOSCOPY     remote, foreign body extraction  . ESOPHAGOGASTRODUODENOSCOPY  08/28/2011   hiatal hernia, erosion on fold of diaphragmatic hiatus (cameron lesion), antral and prepylori erosions, SB bx negative, gastric bx showed reactive gastropathy but no H.Pylori  . HEMORROIDECTOMY    . LEFT HEART CATH AND CORONARY ANGIOGRAPHY N/A 02/16/2017   Procedure: Left Heart Cath and Coronary Angiography;  Surgeon: Burnell Blanks, MD;   Location: [place] [facility];  Service: Cardiovascular;  Laterality: N/A;  . TONSILLECTOMY    . TUBAL LIGATION        reports that she has never smoked. She has never used smokeless tobacco. She reports that she does not drink alcohol or use drugs. Social History   Social History  . Marital status: Widowed    Spouse name: N/A  . Number of children: 2  . Years of education: N/A   Occupational History  . Not on file.   Social History Main Topics  . Smoking status: Never Smoker  . Smokeless tobacco: Never Used  . Alcohol use No  . Drug use: No  . Sexual activity: Not on file   Other Topics Concern  . Not on file   Social History Narrative  . No narrative on file   Functional Status Survey:    No Known Allergies  Pertinent  Health Maintenance Due  Topic Date Due  . FOOT EXAM  01/22/1951  . OPHTHALMOLOGY EXAM  01/22/1951  . URINE MICROALBUMIN  01/22/1951  . INFLUENZA VACCINE  11/23/2017 (Originally 07/24/2016)  . PNA vac Low Risk Adult (1 of 2 - PCV13) 11/23/2017 (Originally 01/23/2004)  . HEMOGLOBIN A1C  08/24/2017  . DEXA SCAN  Completed    Medications: Current Outpatient Prescriptions on File Prior to Visit  Medication Sig Dispense Refill  . acetaminophen (TYLENOL) 325 MG tablet Take 2 tablets (650 mg total) by mouth every 6 (six) hours as needed for mild pain.    Marland Kitchen amiodarone (PACERONE) 200 MG tablet Take 1 tablet (200 mg total) by mouth daily.    Marland Kitchen amLODipine (NORVASC) 10 MG tablet Take 1 tablet (10 mg total) by mouth daily.    Marland Kitchen aspirin 81 MG tablet Take 81 mg by mouth daily.      . cloNIDine (CATAPRES) 0.2 MG tablet Take 1 tablet (0.2 mg total) by mouth 2 (two) times daily.    . furosemide (LASIX) 20 MG tablet Take 60 mg by mouth 2 (two) times daily.     Marland Kitchen glipiZIDE (GLIPIZIDE XL) 5 MG 24 hr tablet Take 1 tablet (5 mg total) by mouth daily with breakfast. Do not take this medication if your blood sugar is less than 110.    . Linagliptin-Metformin HCl  (JENTADUETO) 2.5-500 MG TABS Take 1 tablet by mouth 2 (two) times daily. Do not take this medication if your blood sugar is less than 110.    Marland Kitchen potassium chloride SA (K-DUR,KLOR-CON) 20 MEQ tablet Take 2 tablets (40 mEq total) by mouth daily.    . simvastatin (ZOCOR) 20 MG tablet TAKE (1) TABLET BY MOUTH ONCE DAILY AT BEDTIME FOR CHOLESTEROL. 90 tablet 3  . traMADol (ULTRAM) 50 MG tablet Take 1 tablet (50 mg total) by mouth every 6 (six) hours as needed for severe pain. 30 tablet 0   No current facility-administered medications on file prior to visit.      Review of Systems    In general  does not complaining of any fever or chills  Skin does not complain of rashes or itching surgical sites appear to be fairly unremarkable.  Head ears eyes nose mouth and throat does not complain aofore throat or visual changes.  Respiratory status she still has shortness of breath with exertion but this is not new does not complain of cough or increased shortness of breath from baseline  Cardiac is not complaining of chest pain has some mild lower extremity edema.  GI does not complain of nausea vomiting diarrhea constipation since she eats well.  GU does not pain of dysuria but states she has an intermittent vaginal discharge.  Muscle skeletal continues to complain of some weakness but apparently this is improving does not complain of joint pain  Neurologic is not complaining of dizziness headache or syncope.  Psych is not complaining of overt depression or anxiety.  Vitals:   03/20/17 1410  BP: 125/73  Pulse: 69  Resp: (!) 22  Temp: 97.3 F (36.3 C)  TempSrc: Oral  SpO2: 97%  Weight: 171 lb 9.6 oz (77.8 kg)  Height: 5\' 1"  (1.549 m)   Body mass index is 32.42 kg/m. Physical Exam  In general this is a well-nourished elderly female in no distress sitting in her chair.  Her skin is warm and dry there is a well-healed midline chest scar with crusting I do not see signs of  drainage or infection here or erythema.     Eyes pupils appear reactive light sclera and conjunctiva are clear oropharynx is clear mucous membranes moist.  Chest is clear to auscultation with shallow air entry there is no labored breathing.  Heart is regular rate and rhythm without murmur gallop or rub she has trace-1+ lower extremity edema -- she has compression hose applied.  Abdomen soft nontender with positive bowel sounds.  Muscle skeletal is able to move all extremities 4 upper extremity strength appears preserved has some lower extremity weakness. Appears to be getting somewhat stronger however  Neurologic is grossly intact no lateralizing findings her speech is clear.  Psych she appears alert and oriented pleasant and appropriate   Labs reviewed: Basic Metabolic Panel:  Recent Labs  02/17/17 0330 02/17/17 1604  03/01/17 0425  03/11/17 0700 03/13/17 0700 03/20/17 0700  NA 140  --   < > 146*  < > 144 139 141  K 4.9  --   < > 3.7  < > 4.0 4.2 3.9  CL 111  --   < > 100*  < > 103 100* 95*  CO2 22  --   < > 37*  < > 31 31 37*  GLUCOSE 98  --   < > 82  < > 79 176* 60*  BUN 12  --   < > 23*  < > 21* 22* 21*  CREATININE 1.14* 1.30*  < > 1.48*  < > 1.47* 1.46* 1.54*  CALCIUM 7.7*  --   < > 8.6*  < > 9.7 9.3 9.3  MG 2.4 2.4  --  1.9  --   --   --   --   < > = values in this interval not displayed. Liver Function Tests:  Recent Labs  03/05/17 1015 03/20/17 0700  AST 41 18  ALT 72* 23  ALKPHOS 102 81  BILITOT 0.5 0.4  PROT 8.1 7.4  ALBUMIN 3.1* 2.9*   No results for input(s): LIPASE, AMYLASE in the last 8760 hours. No results for input(s): AMMONIA in the  last 8760 hours. CBC:  Recent Labs  02/16/17 0242  03/02/17 0456 03/05/17 1015 03/20/17 0700  WBC 12.4*  < > 14.9* 12.4* 10.2  NEUTROABS 3.0  --   --  9.1* 5.5  HGB 11.2*  < > 8.5* 10.5* 9.3*  HCT 35.8*  < > 27.8* 33.3* 30.4*  MCV 78.5  < > 82.5 84.3 83.5  PLT 257  < > 460* 544* 292  < > =  values in this interval not displayed. Cardiac Enzymes:  Recent Labs  02/16/17 0242  TROPONINI 0.08*   BNP: Invalid input(s): POCBNP CBG:  Recent Labs  03/01/17 2056 03/02/17 0642 03/02/17 1149  GLUCAP 100* 122* 188*    Procedures and Imaging Studies During Stay: Dg Chest 2 View  Result Date: 02/25/2017 CLINICAL DATA:  Shortness of breath, status post CABG on February 16, 2017 EXAM: CHEST  2 VIEW COMPARISON:  PA and lateral chest x-ray of February 23, 2017. FINDINGS: The lungs are hypoinflated. Persistent bibasilar densities are present and on the left are slightly more conspicuous. The right hemidiaphragm remains obscured. The cardiac silhouette is enlarged. The pulmonary vascularity is engorged. The PICC line tip projects over the cavoatrial junction. Surgical wires are intact. Bilateral pleural effusions have increased slightly. IMPRESSION: CHF with pulmonary interstitial edema and bilateral pleural effusions. Bibasilar atelectasis. Overall the appearance of the chest has deteriorated slightly with slight worsening of interstitial edema. Electronically Signed   By: David  Martinique M.D.   On: 02/25/2017 09:45   Dg Chest 2 View  Result Date: 02/23/2017 CLINICAL DATA:  Status post coronary artery bypass graft. EXAM: CHEST  2 VIEW COMPARISON:  Radiograph February 21, 2017. FINDINGS: Stable cardiomediastinal silhouette. Sternotomy wires are noted. No pneumothorax is noted. Right-sided PICC line is unchanged in position. Stable bilateral basilar opacities are noted concerning for atelectasis or edema with associated pleural effusions, right greater than left. Bony thorax is unremarkable. IMPRESSION: Stable bibasilar opacities as described above. Electronically Signed   By: Marijo Conception, M.D.   On: 02/23/2017 09:07   Dg Chest Port 1 View  Result Date: 02/28/2017 CLINICAL DATA:  CHF EXAM: PORTABLE CHEST 1 VIEW COMPARISON:  02/27/2017 FINDINGS: Cardiac enlargement. Mild progression of vascular  congestion and edema. Mild progression of bilateral pleural effusions and bibasilar atelectasis. Central line remains in the lower SVC. IMPRESSION: Congestive heart failure with mild progression. Electronically Signed   By: Franchot Gallo M.D.   On: 02/28/2017 08:48   Dg Chest Port 1 View  Result Date: 02/27/2017 CLINICAL DATA:  Status post CABG on February 16, 2017 EXAM: PORTABLE CHEST 1 VIEW COMPARISON:  PA and lateral chest x-ray of February 25, 2017 FINDINGS: The lungs are slightly better inflated today. Bibasilar densities. Bilateral pleural effusions greatest on the right are present and stable. The cardiac silhouette is mildly enlarged. The left heart border remains largely obscured. The central pulmonary vascularity is prominent but less engorged. There is calcification in the wall of the aortic arch. The sternal wires are intact. The right sided PICC line tip projects over the cavoatrial junction. IMPRESSION: Interval decrease in pulmonary interstitial edema consistent with improving CHF. Bibasilar atelectasis and pleural effusions are fairly stable. Thoracic aortic atherosclerosis. Electronically Signed   By: David  Martinique M.D.   On: 02/27/2017 07:33   Dg Chest Port 1 View  Result Date: 02/21/2017 CLINICAL DATA:  Pneumothorax. EXAM: PORTABLE CHEST 1 VIEW COMPARISON:  02/20/2017. FINDINGS: Right IJ sheath in stable position. Right PICC line noted with tip projected  over right atrium. Prior CABG. Cardiomegaly with bilateral from interstitial prominence and bilateral pleural effusions. Findings consistent CHF. No pneumothorax . IMPRESSION: 1. Right IJ sheath noted in stable position. Interim placement of right PICC line, its tip is over the right atrium. 2. Prior CABG. Cardiomegaly with slight progression of bilateral pulmonary interstitial prominence and bilateral pleural effusions consistent with progressive CHF . Electronically Signed   By: Marcello Moores  Register   On: 02/21/2017 07:41   Dg Chest Port 1  View  Result Date: 02/20/2017 CLINICAL DATA:  Status post CABG 4 days ago. EXAM: PORTABLE CHEST 1 VIEW COMPARISON:  Portable chest x-ray of February 19, 2017. FINDINGS: Hypoinflation has not worsened since extubation of the trachea. There is bibasilar atelectasis. There is a moderate sized right pleural effusion layering posteriorly. The left chest tube is in stable position with the tip overlying the posterolateral aspect of the fourth rib. There is no pneumothorax. The cardiac silhouette remains enlarged. The pulmonary vascularity is slightly less engorged. There is calcification in the wall of the aortic arch. The Swan-Ganz catheter has been removed as has the esophagogastric tube and the lower left chest tube. IMPRESSION: Persistent hypoinflation with bibasilar atelectasis. Moderate size right pleural effusion. No pneumothorax. Stable cardiomegaly with slightly decreased pulmonary vascular congestion and pulmonary edema. Electronically Signed   By: David  Martinique M.D.   On: 02/20/2017 07:53   Dg Chest Port 1 View  Result Date: 02/19/2017 CLINICAL DATA:  Emergency CABG for critical LEFT coronary stenosis. EXAM: PORTABLE CHEST 1 VIEW COMPARISON:  02/18/2017. FINDINGS: Cardiomegaly. BILATERAL lower lobe opacities appear worse, possibly representing atelectasis, infiltrate, effusion, or edema. Chest and mediastinal tubes in good position without pneumothorax. Swan-Ganz catheter tip main pulmonary artery. ETT 12 mm above carina, could be pulled back 20-30 mm. I no longer see the intra aortic balloon pump. IMPRESSION: Status post emergency CABG. Tubes and lines as described. Worsening aeration. Electronically Signed   By: Staci Righter M.D.   On: 02/19/2017 07:29    Assessment/Plan:     History of CABG 3- continues to heal well she continues on aspirin 81 mg today as well as simvastatin 20 mg daily. She does have follow-up with cardiology thought to be doing well per recent cardiology visit earlier this  month.  #2 history of chronic diastolic CHF her weight appears to be stable edema appears to be at baseline has shortness of breath and hypoxia with exertion which is not new will need oxygen on discharge.  Continues on Lasix 60 mg twice a day with potassium 40 mEq daily.  #3 history of chronic kidney disease creatinine of 1.54 on lab today appears to be relatively baseline with BUN of 21.  This will need follow-up as well as by home health on first laboratory day next week and primary care provider notified of results.  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 Lyndel Safe  5 history of atrial fibrillation she will be off for a amiodarone starting today-she is on aspirin for anticoagulation-her rate will need to be monitored this has been stable during her stay here.  #6 history of hypertension she is on clonidine 0.2 mg twice a day as well as Norvasc 10 mg a day-this appears to  be stable recent blood pressures 125/73-122/78  #7 history of possible vaginal discharge-again urinalysis is pending at this point will monitor.--Some question whether this was obtained this morning-if not obtained today--obtain  tomorrow morning patient states her discharge if it occurs   Most likely occurs in the morning  #8 history of anemia suspect there is an element of chronicity with history of chronic renal disease-hemoglobin 9.3 today previous weeks was running from the mid eights up to mid 10 will need periodic monitoring will order this for early next week as well with primary care provider notified of results       Patient is being discharged with the following home health services:  PT and OT for further strengthening as well as nursing support for multiple  medical issues  Patient is being discharged with the following durable medical equipment:  Will need oxygen since she does have desaturation when she exerts herself     [id number]-of note greater than 35 minutes spent on this discharge summary-greater than 50% of time spent coordinating plan of care for numerous diagnoses

## 2017-03-21 ENCOUNTER — Encounter (HOSPITAL_COMMUNITY)
Admission: AD | Admit: 2017-03-21 | Discharge: 2017-03-21 | Disposition: A | Discharge status: Home / Self Care | Payer: Medicare Other | Source: Skilled Nursing Facility | Attending: *Deleted | Admitting: *Deleted

## 2017-03-21 LAB — URINALYSIS, ROUTINE W REFLEX MICROSCOPIC
BILIRUBIN URINE: NEGATIVE
Bacteria, UA: NONE SEEN
Glucose, UA: NEGATIVE mg/dL
Ketones, ur: NEGATIVE mg/dL
Nitrite: NEGATIVE
PROTEIN: NEGATIVE mg/dL
Specific Gravity, Urine: 1.008 (ref 1.005–1.030)
pH: 5 (ref 5.0–8.0)

## 2017-03-22 LAB — URINE CULTURE: Culture: 20000 — AB

## 2017-03-26 LAB — ECHO INTRAOPERATIVE TEE
Height: 61 in
Weight: 2848 oz

## 2017-03-27 DIAGNOSIS — N183 Chronic kidney disease, stage 3 (moderate): Secondary | ICD-10-CM | POA: Diagnosis not present

## 2017-03-27 DIAGNOSIS — Z48812 Encounter for surgical aftercare following surgery on the circulatory system: Secondary | ICD-10-CM | POA: Diagnosis not present

## 2017-03-27 DIAGNOSIS — E1122 Type 2 diabetes mellitus with diabetic chronic kidney disease: Secondary | ICD-10-CM | POA: Diagnosis not present

## 2017-03-27 DIAGNOSIS — M1991 Primary osteoarthritis, unspecified site: Secondary | ICD-10-CM | POA: Diagnosis not present

## 2017-03-27 DIAGNOSIS — I2510 Atherosclerotic heart disease of native coronary artery without angina pectoris: Secondary | ICD-10-CM | POA: Diagnosis not present

## 2017-03-27 DIAGNOSIS — E785 Hyperlipidemia, unspecified: Secondary | ICD-10-CM | POA: Diagnosis not present

## 2017-03-27 DIAGNOSIS — I5032 Chronic diastolic (congestive) heart failure: Secondary | ICD-10-CM | POA: Diagnosis not present

## 2017-03-27 DIAGNOSIS — I252 Old myocardial infarction: Secondary | ICD-10-CM | POA: Diagnosis not present

## 2017-03-27 DIAGNOSIS — I4891 Unspecified atrial fibrillation: Secondary | ICD-10-CM | POA: Diagnosis not present

## 2017-03-27 DIAGNOSIS — I110 Hypertensive heart disease with heart failure: Secondary | ICD-10-CM | POA: Diagnosis not present

## 2017-03-27 DIAGNOSIS — D631 Anemia in chronic kidney disease: Secondary | ICD-10-CM | POA: Diagnosis not present

## 2017-03-28 DIAGNOSIS — I4891 Unspecified atrial fibrillation: Secondary | ICD-10-CM | POA: Diagnosis not present

## 2017-03-28 DIAGNOSIS — E785 Hyperlipidemia, unspecified: Secondary | ICD-10-CM | POA: Diagnosis not present

## 2017-03-28 DIAGNOSIS — I252 Old myocardial infarction: Secondary | ICD-10-CM | POA: Diagnosis not present

## 2017-03-28 DIAGNOSIS — I110 Hypertensive heart disease with heart failure: Secondary | ICD-10-CM | POA: Diagnosis not present

## 2017-03-28 DIAGNOSIS — D631 Anemia in chronic kidney disease: Secondary | ICD-10-CM | POA: Diagnosis not present

## 2017-03-28 DIAGNOSIS — Z48812 Encounter for surgical aftercare following surgery on the circulatory system: Secondary | ICD-10-CM | POA: Diagnosis not present

## 2017-03-28 DIAGNOSIS — N183 Chronic kidney disease, stage 3 (moderate): Secondary | ICD-10-CM | POA: Diagnosis not present

## 2017-03-28 DIAGNOSIS — I2510 Atherosclerotic heart disease of native coronary artery without angina pectoris: Secondary | ICD-10-CM | POA: Diagnosis not present

## 2017-03-28 DIAGNOSIS — M1991 Primary osteoarthritis, unspecified site: Secondary | ICD-10-CM | POA: Diagnosis not present

## 2017-03-28 DIAGNOSIS — E1122 Type 2 diabetes mellitus with diabetic chronic kidney disease: Secondary | ICD-10-CM | POA: Diagnosis not present

## 2017-03-28 DIAGNOSIS — I5032 Chronic diastolic (congestive) heart failure: Secondary | ICD-10-CM | POA: Diagnosis not present

## 2017-03-29 DIAGNOSIS — Z48812 Encounter for surgical aftercare following surgery on the circulatory system: Secondary | ICD-10-CM | POA: Diagnosis not present

## 2017-03-29 DIAGNOSIS — I5032 Chronic diastolic (congestive) heart failure: Secondary | ICD-10-CM | POA: Diagnosis not present

## 2017-03-29 DIAGNOSIS — M1991 Primary osteoarthritis, unspecified site: Secondary | ICD-10-CM | POA: Diagnosis not present

## 2017-03-29 DIAGNOSIS — E785 Hyperlipidemia, unspecified: Secondary | ICD-10-CM | POA: Diagnosis not present

## 2017-03-29 DIAGNOSIS — I110 Hypertensive heart disease with heart failure: Secondary | ICD-10-CM | POA: Diagnosis not present

## 2017-03-29 DIAGNOSIS — I4891 Unspecified atrial fibrillation: Secondary | ICD-10-CM | POA: Diagnosis not present

## 2017-03-29 DIAGNOSIS — I252 Old myocardial infarction: Secondary | ICD-10-CM | POA: Diagnosis not present

## 2017-03-29 DIAGNOSIS — D631 Anemia in chronic kidney disease: Secondary | ICD-10-CM | POA: Diagnosis not present

## 2017-03-29 DIAGNOSIS — E1122 Type 2 diabetes mellitus with diabetic chronic kidney disease: Secondary | ICD-10-CM | POA: Diagnosis not present

## 2017-03-29 DIAGNOSIS — I2510 Atherosclerotic heart disease of native coronary artery without angina pectoris: Secondary | ICD-10-CM | POA: Diagnosis not present

## 2017-03-29 DIAGNOSIS — N183 Chronic kidney disease, stage 3 (moderate): Secondary | ICD-10-CM | POA: Diagnosis not present

## 2017-04-02 ENCOUNTER — Telehealth: Payer: Self-pay | Admitting: Cardiology

## 2017-04-02 DIAGNOSIS — I213 ST elevation (STEMI) myocardial infarction of unspecified site: Secondary | ICD-10-CM | POA: Diagnosis not present

## 2017-04-02 NOTE — Telephone Encounter (Signed)
Pt post CABG x 3 due to see Dr Servando Snare in 2 days, I gave [facility] Mallory their number

## 2017-04-02 NOTE — Telephone Encounter (Signed)
Mallory w/ [facility] would like to know if the pt has any cardiac precautions they need to be aware of, Mallory can be reached @ [phone]

## 2017-04-03 DIAGNOSIS — I5032 Chronic diastolic (congestive) heart failure: Secondary | ICD-10-CM | POA: Diagnosis not present

## 2017-04-03 DIAGNOSIS — I110 Hypertensive heart disease with heart failure: Secondary | ICD-10-CM | POA: Diagnosis not present

## 2017-04-03 DIAGNOSIS — N183 Chronic kidney disease, stage 3 (moderate): Secondary | ICD-10-CM | POA: Diagnosis not present

## 2017-04-03 DIAGNOSIS — D631 Anemia in chronic kidney disease: Secondary | ICD-10-CM | POA: Diagnosis not present

## 2017-04-03 DIAGNOSIS — I4891 Unspecified atrial fibrillation: Secondary | ICD-10-CM | POA: Diagnosis not present

## 2017-04-03 DIAGNOSIS — I252 Old myocardial infarction: Secondary | ICD-10-CM | POA: Diagnosis not present

## 2017-04-03 DIAGNOSIS — M1991 Primary osteoarthritis, unspecified site: Secondary | ICD-10-CM | POA: Diagnosis not present

## 2017-04-03 DIAGNOSIS — I2510 Atherosclerotic heart disease of native coronary artery without angina pectoris: Secondary | ICD-10-CM | POA: Diagnosis not present

## 2017-04-03 DIAGNOSIS — E785 Hyperlipidemia, unspecified: Secondary | ICD-10-CM | POA: Diagnosis not present

## 2017-04-03 DIAGNOSIS — E1122 Type 2 diabetes mellitus with diabetic chronic kidney disease: Secondary | ICD-10-CM | POA: Diagnosis not present

## 2017-04-03 DIAGNOSIS — Z48812 Encounter for surgical aftercare following surgery on the circulatory system: Secondary | ICD-10-CM | POA: Diagnosis not present

## 2017-04-04 ENCOUNTER — Ambulatory Visit: Payer: Medicare Other | Admitting: Cardiothoracic Surgery

## 2017-04-04 DIAGNOSIS — E1122 Type 2 diabetes mellitus with diabetic chronic kidney disease: Secondary | ICD-10-CM | POA: Diagnosis not present

## 2017-04-04 DIAGNOSIS — I110 Hypertensive heart disease with heart failure: Secondary | ICD-10-CM | POA: Diagnosis not present

## 2017-04-04 DIAGNOSIS — I252 Old myocardial infarction: Secondary | ICD-10-CM | POA: Diagnosis not present

## 2017-04-04 DIAGNOSIS — Z48812 Encounter for surgical aftercare following surgery on the circulatory system: Secondary | ICD-10-CM | POA: Diagnosis not present

## 2017-04-04 DIAGNOSIS — M1991 Primary osteoarthritis, unspecified site: Secondary | ICD-10-CM | POA: Diagnosis not present

## 2017-04-04 DIAGNOSIS — I5032 Chronic diastolic (congestive) heart failure: Secondary | ICD-10-CM | POA: Diagnosis not present

## 2017-04-04 DIAGNOSIS — N183 Chronic kidney disease, stage 3 (moderate): Secondary | ICD-10-CM | POA: Diagnosis not present

## 2017-04-04 DIAGNOSIS — I4891 Unspecified atrial fibrillation: Secondary | ICD-10-CM | POA: Diagnosis not present

## 2017-04-04 DIAGNOSIS — I2510 Atherosclerotic heart disease of native coronary artery without angina pectoris: Secondary | ICD-10-CM | POA: Diagnosis not present

## 2017-04-04 DIAGNOSIS — D631 Anemia in chronic kidney disease: Secondary | ICD-10-CM | POA: Diagnosis not present

## 2017-04-04 DIAGNOSIS — E785 Hyperlipidemia, unspecified: Secondary | ICD-10-CM | POA: Diagnosis not present

## 2017-04-05 ENCOUNTER — Other Ambulatory Visit: Payer: Self-pay | Admitting: Cardiothoracic Surgery

## 2017-04-05 DIAGNOSIS — E785 Hyperlipidemia, unspecified: Secondary | ICD-10-CM | POA: Diagnosis not present

## 2017-04-05 DIAGNOSIS — E1122 Type 2 diabetes mellitus with diabetic chronic kidney disease: Secondary | ICD-10-CM | POA: Diagnosis not present

## 2017-04-05 DIAGNOSIS — Z951 Presence of aortocoronary bypass graft: Secondary | ICD-10-CM

## 2017-04-05 DIAGNOSIS — I110 Hypertensive heart disease with heart failure: Secondary | ICD-10-CM | POA: Diagnosis not present

## 2017-04-05 DIAGNOSIS — M1991 Primary osteoarthritis, unspecified site: Secondary | ICD-10-CM | POA: Diagnosis not present

## 2017-04-05 DIAGNOSIS — I252 Old myocardial infarction: Secondary | ICD-10-CM | POA: Diagnosis not present

## 2017-04-05 DIAGNOSIS — D631 Anemia in chronic kidney disease: Secondary | ICD-10-CM | POA: Diagnosis not present

## 2017-04-05 DIAGNOSIS — I5032 Chronic diastolic (congestive) heart failure: Secondary | ICD-10-CM | POA: Diagnosis not present

## 2017-04-05 DIAGNOSIS — I2510 Atherosclerotic heart disease of native coronary artery without angina pectoris: Secondary | ICD-10-CM | POA: Diagnosis not present

## 2017-04-05 DIAGNOSIS — N183 Chronic kidney disease, stage 3 (moderate): Secondary | ICD-10-CM | POA: Diagnosis not present

## 2017-04-05 DIAGNOSIS — I4891 Unspecified atrial fibrillation: Secondary | ICD-10-CM | POA: Diagnosis not present

## 2017-04-05 DIAGNOSIS — Z48812 Encounter for surgical aftercare following surgery on the circulatory system: Secondary | ICD-10-CM | POA: Diagnosis not present

## 2017-04-08 ENCOUNTER — Ambulatory Visit (INDEPENDENT_AMBULATORY_CARE_PROVIDER_SITE_OTHER): Payer: Self-pay | Admitting: Physician Assistant

## 2017-04-08 ENCOUNTER — Ambulatory Visit
Admission: RE | Admit: 2017-04-08 | Discharge: 2017-04-08 | Disposition: A | Discharge status: Home / Self Care | Payer: Medicare Other | Source: Ambulatory Visit | Attending: Cardiothoracic Surgery | Admitting: Cardiothoracic Surgery

## 2017-04-08 VITALS — BP 124/62 | HR 56 | Resp 16 | Ht 61.0 in | Wt 166.0 lb

## 2017-04-08 DIAGNOSIS — I2510 Atherosclerotic heart disease of native coronary artery without angina pectoris: Secondary | ICD-10-CM

## 2017-04-08 DIAGNOSIS — Z951 Presence of aortocoronary bypass graft: Secondary | ICD-10-CM

## 2017-04-08 DIAGNOSIS — R0602 Shortness of breath: Secondary | ICD-10-CM | POA: Diagnosis not present

## 2017-04-08 NOTE — Patient Instructions (Signed)
You may continue to gradually increase your physical activity as tolerated.  Refrain from any heavy lifting or strenuous use of your arms and shoulders until at least 8 weeks from the time of your surgery, and avoid activities that cause increased pain in your chest on the side of your surgical incision.  Otherwise you may continue to increase activities without any particular limitations.  Increase the intensity and duration of physical activity gradually. 

## 2017-04-08 NOTE — Progress Notes (Signed)
HPI:  Patient returns for routine postoperative follow-up having undergone Emergent CABG x 3 on 02/16/2017 The patient's early postoperative recovery while in the hospital was notable for development of Rapid Atrial Fibrillation, flash pulmonary edema requiring aggressive diuresis, and deconditioning requiring SNF.  Since hospital discharge the patient reports she continues to make progress.  She is continuing working with PT since discharge from SNF.  She continues to get tired and short of breath at times.  She has questions about a diuretic she was taking for her eye prescribed by [facility].  She is ambulating with assistance and her incisions are healing well.   Current Outpatient Prescriptions  Medication Sig Dispense Refill  . acetaminophen (TYLENOL) 325 MG tablet Take 2 tablets (650 mg total) by mouth every 6 (six) hours as needed for mild pain.    Marland Kitchen amLODipine (NORVASC) 10 MG tablet Take 1 tablet (10 mg total) by mouth daily.    Marland Kitchen aspirin 81 MG tablet Take 81 mg by mouth daily.      . cloNIDine (CATAPRES) 0.2 MG tablet Take 1 tablet (0.2 mg total) by mouth 2 (two) times daily.    . furosemide (LASIX) 20 MG tablet Take 60 mg by mouth 2 (two) times daily.     Marland Kitchen glipiZIDE (GLIPIZIDE XL) 5 MG 24 hr tablet Take 1 tablet (5 mg total) by mouth daily with breakfast. Do not take this medication if your blood sugar is less than 110.    . Linagliptin-Metformin HCl (JENTADUETO) 2.5-500 MG TABS Take 1 tablet by mouth 2 (two) times daily. Do not take this medication if your blood sugar is less than 110.    Marland Kitchen potassium chloride SA (K-DUR,KLOR-CON) 20 MEQ tablet Take 2 tablets (40 mEq total) by mouth daily.    . simvastatin (ZOCOR) 20 MG tablet TAKE (1) TABLET BY MOUTH ONCE DAILY AT BEDTIME FOR CHOLESTEROL. 90 tablet 3  . traMADol (ULTRAM) 50 MG tablet Take 1 tablet (50 mg total) by mouth every 6 (six) hours as needed for severe pain. 30 tablet 0   No current facility-administered medications for this visit.      Physical Exam:  BP 124/62 (BP Location: Right Arm, Patient Position: Sitting, Cuff Size: Large)   Pulse (!) 56   Resp 16   Ht 5\' 1"  (1.549 m)   Wt 166 lb (75.3 kg)   SpO2 98% Comment: ON 2L O2  BMI 31.37 kg/m   Gen: no apparent distress Heart: RRR Lungs: CTA bilaterally Ext: no edema present Incisions: well healed   Diagnostic Tests:  CXR: no pleural effusions present, no pneumothorax, continued cardiomegaly, mild vascular congestion/atelectasis   A/P:  1. S/p Emergent CABG x 3- continues to make progress, maintaining NSR, Amiodarone has been stopped at Cardiology visit 2. Renal- Stage 3 CKD- lasix was increased at SNF, volume status is good in the office today, with no LE edema present... In regards to her diuretic prescribed at [facility], I instructed patient to continue current regimen of Lasix as her follow state is well controlled currently..She can assess indication for other diuretic for her eye at follow up appointment  3. Dispo- continue PT/OT at home... RTC in 6 months with EBG   Ellwood Handler, PA-C [facility] [phone]

## 2017-04-09 DIAGNOSIS — I110 Hypertensive heart disease with heart failure: Secondary | ICD-10-CM | POA: Diagnosis not present

## 2017-04-09 DIAGNOSIS — E1122 Type 2 diabetes mellitus with diabetic chronic kidney disease: Secondary | ICD-10-CM | POA: Diagnosis not present

## 2017-04-09 DIAGNOSIS — I252 Old myocardial infarction: Secondary | ICD-10-CM | POA: Diagnosis not present

## 2017-04-09 DIAGNOSIS — M1991 Primary osteoarthritis, unspecified site: Secondary | ICD-10-CM | POA: Diagnosis not present

## 2017-04-09 DIAGNOSIS — Z48812 Encounter for surgical aftercare following surgery on the circulatory system: Secondary | ICD-10-CM | POA: Diagnosis not present

## 2017-04-09 DIAGNOSIS — I2510 Atherosclerotic heart disease of native coronary artery without angina pectoris: Secondary | ICD-10-CM | POA: Diagnosis not present

## 2017-04-09 DIAGNOSIS — D631 Anemia in chronic kidney disease: Secondary | ICD-10-CM | POA: Diagnosis not present

## 2017-04-09 DIAGNOSIS — I4891 Unspecified atrial fibrillation: Secondary | ICD-10-CM | POA: Diagnosis not present

## 2017-04-09 DIAGNOSIS — E785 Hyperlipidemia, unspecified: Secondary | ICD-10-CM | POA: Diagnosis not present

## 2017-04-09 DIAGNOSIS — I5032 Chronic diastolic (congestive) heart failure: Secondary | ICD-10-CM | POA: Diagnosis not present

## 2017-04-09 DIAGNOSIS — N183 Chronic kidney disease, stage 3 (moderate): Secondary | ICD-10-CM | POA: Diagnosis not present

## 2017-04-10 DIAGNOSIS — I4891 Unspecified atrial fibrillation: Secondary | ICD-10-CM | POA: Diagnosis not present

## 2017-04-10 DIAGNOSIS — D631 Anemia in chronic kidney disease: Secondary | ICD-10-CM | POA: Diagnosis not present

## 2017-04-10 DIAGNOSIS — I110 Hypertensive heart disease with heart failure: Secondary | ICD-10-CM | POA: Diagnosis not present

## 2017-04-10 DIAGNOSIS — N183 Chronic kidney disease, stage 3 (moderate): Secondary | ICD-10-CM | POA: Diagnosis not present

## 2017-04-10 DIAGNOSIS — Z48812 Encounter for surgical aftercare following surgery on the circulatory system: Secondary | ICD-10-CM | POA: Diagnosis not present

## 2017-04-10 DIAGNOSIS — E1122 Type 2 diabetes mellitus with diabetic chronic kidney disease: Secondary | ICD-10-CM | POA: Diagnosis not present

## 2017-04-10 DIAGNOSIS — I5032 Chronic diastolic (congestive) heart failure: Secondary | ICD-10-CM | POA: Diagnosis not present

## 2017-04-10 DIAGNOSIS — I2510 Atherosclerotic heart disease of native coronary artery without angina pectoris: Secondary | ICD-10-CM | POA: Diagnosis not present

## 2017-04-10 DIAGNOSIS — E785 Hyperlipidemia, unspecified: Secondary | ICD-10-CM | POA: Diagnosis not present

## 2017-04-10 DIAGNOSIS — I252 Old myocardial infarction: Secondary | ICD-10-CM | POA: Diagnosis not present

## 2017-04-10 DIAGNOSIS — M1991 Primary osteoarthritis, unspecified site: Secondary | ICD-10-CM | POA: Diagnosis not present

## 2017-04-11 DIAGNOSIS — I4891 Unspecified atrial fibrillation: Secondary | ICD-10-CM | POA: Diagnosis not present

## 2017-04-11 DIAGNOSIS — E785 Hyperlipidemia, unspecified: Secondary | ICD-10-CM | POA: Diagnosis not present

## 2017-04-11 DIAGNOSIS — M1991 Primary osteoarthritis, unspecified site: Secondary | ICD-10-CM | POA: Diagnosis not present

## 2017-04-11 DIAGNOSIS — I2510 Atherosclerotic heart disease of native coronary artery without angina pectoris: Secondary | ICD-10-CM | POA: Diagnosis not present

## 2017-04-11 DIAGNOSIS — E1122 Type 2 diabetes mellitus with diabetic chronic kidney disease: Secondary | ICD-10-CM | POA: Diagnosis not present

## 2017-04-11 DIAGNOSIS — N183 Chronic kidney disease, stage 3 (moderate): Secondary | ICD-10-CM | POA: Diagnosis not present

## 2017-04-11 DIAGNOSIS — I252 Old myocardial infarction: Secondary | ICD-10-CM | POA: Diagnosis not present

## 2017-04-11 DIAGNOSIS — I110 Hypertensive heart disease with heart failure: Secondary | ICD-10-CM | POA: Diagnosis not present

## 2017-04-11 DIAGNOSIS — I5032 Chronic diastolic (congestive) heart failure: Secondary | ICD-10-CM | POA: Diagnosis not present

## 2017-04-11 DIAGNOSIS — D631 Anemia in chronic kidney disease: Secondary | ICD-10-CM | POA: Diagnosis not present

## 2017-04-11 DIAGNOSIS — Z48812 Encounter for surgical aftercare following surgery on the circulatory system: Secondary | ICD-10-CM | POA: Diagnosis not present

## 2017-04-12 DIAGNOSIS — I252 Old myocardial infarction: Secondary | ICD-10-CM | POA: Diagnosis not present

## 2017-04-12 DIAGNOSIS — D631 Anemia in chronic kidney disease: Secondary | ICD-10-CM | POA: Diagnosis not present

## 2017-04-12 DIAGNOSIS — Z48812 Encounter for surgical aftercare following surgery on the circulatory system: Secondary | ICD-10-CM | POA: Diagnosis not present

## 2017-04-12 DIAGNOSIS — E1122 Type 2 diabetes mellitus with diabetic chronic kidney disease: Secondary | ICD-10-CM | POA: Diagnosis not present

## 2017-04-12 DIAGNOSIS — N183 Chronic kidney disease, stage 3 (moderate): Secondary | ICD-10-CM | POA: Diagnosis not present

## 2017-04-12 DIAGNOSIS — I110 Hypertensive heart disease with heart failure: Secondary | ICD-10-CM | POA: Diagnosis not present

## 2017-04-12 DIAGNOSIS — M1991 Primary osteoarthritis, unspecified site: Secondary | ICD-10-CM | POA: Diagnosis not present

## 2017-04-12 DIAGNOSIS — I2510 Atherosclerotic heart disease of native coronary artery without angina pectoris: Secondary | ICD-10-CM | POA: Diagnosis not present

## 2017-04-12 DIAGNOSIS — I4891 Unspecified atrial fibrillation: Secondary | ICD-10-CM | POA: Diagnosis not present

## 2017-04-12 DIAGNOSIS — I5032 Chronic diastolic (congestive) heart failure: Secondary | ICD-10-CM | POA: Diagnosis not present

## 2017-04-12 DIAGNOSIS — E785 Hyperlipidemia, unspecified: Secondary | ICD-10-CM | POA: Diagnosis not present

## 2017-04-15 DIAGNOSIS — Z48812 Encounter for surgical aftercare following surgery on the circulatory system: Secondary | ICD-10-CM | POA: Diagnosis not present

## 2017-04-15 DIAGNOSIS — D631 Anemia in chronic kidney disease: Secondary | ICD-10-CM | POA: Diagnosis not present

## 2017-04-15 DIAGNOSIS — N183 Chronic kidney disease, stage 3 (moderate): Secondary | ICD-10-CM | POA: Diagnosis not present

## 2017-04-15 DIAGNOSIS — I110 Hypertensive heart disease with heart failure: Secondary | ICD-10-CM | POA: Diagnosis not present

## 2017-04-15 DIAGNOSIS — I5032 Chronic diastolic (congestive) heart failure: Secondary | ICD-10-CM | POA: Diagnosis not present

## 2017-04-15 DIAGNOSIS — I2510 Atherosclerotic heart disease of native coronary artery without angina pectoris: Secondary | ICD-10-CM | POA: Diagnosis not present

## 2017-04-15 DIAGNOSIS — M1991 Primary osteoarthritis, unspecified site: Secondary | ICD-10-CM | POA: Diagnosis not present

## 2017-04-15 DIAGNOSIS — E1122 Type 2 diabetes mellitus with diabetic chronic kidney disease: Secondary | ICD-10-CM | POA: Diagnosis not present

## 2017-04-15 DIAGNOSIS — E785 Hyperlipidemia, unspecified: Secondary | ICD-10-CM | POA: Diagnosis not present

## 2017-04-15 DIAGNOSIS — I4891 Unspecified atrial fibrillation: Secondary | ICD-10-CM | POA: Diagnosis not present

## 2017-04-15 DIAGNOSIS — I252 Old myocardial infarction: Secondary | ICD-10-CM | POA: Diagnosis not present

## 2017-04-16 DIAGNOSIS — I110 Hypertensive heart disease with heart failure: Secondary | ICD-10-CM | POA: Diagnosis not present

## 2017-04-16 DIAGNOSIS — I252 Old myocardial infarction: Secondary | ICD-10-CM | POA: Diagnosis not present

## 2017-04-16 DIAGNOSIS — I5032 Chronic diastolic (congestive) heart failure: Secondary | ICD-10-CM | POA: Diagnosis not present

## 2017-04-16 DIAGNOSIS — N183 Chronic kidney disease, stage 3 (moderate): Secondary | ICD-10-CM | POA: Diagnosis not present

## 2017-04-16 DIAGNOSIS — I4891 Unspecified atrial fibrillation: Secondary | ICD-10-CM | POA: Diagnosis not present

## 2017-04-16 DIAGNOSIS — Z48812 Encounter for surgical aftercare following surgery on the circulatory system: Secondary | ICD-10-CM | POA: Diagnosis not present

## 2017-04-16 DIAGNOSIS — E1122 Type 2 diabetes mellitus with diabetic chronic kidney disease: Secondary | ICD-10-CM | POA: Diagnosis not present

## 2017-04-16 DIAGNOSIS — M1991 Primary osteoarthritis, unspecified site: Secondary | ICD-10-CM | POA: Diagnosis not present

## 2017-04-16 DIAGNOSIS — I2510 Atherosclerotic heart disease of native coronary artery without angina pectoris: Secondary | ICD-10-CM | POA: Diagnosis not present

## 2017-04-16 DIAGNOSIS — E785 Hyperlipidemia, unspecified: Secondary | ICD-10-CM | POA: Diagnosis not present

## 2017-04-16 DIAGNOSIS — D631 Anemia in chronic kidney disease: Secondary | ICD-10-CM | POA: Diagnosis not present

## 2017-04-17 DIAGNOSIS — D631 Anemia in chronic kidney disease: Secondary | ICD-10-CM | POA: Diagnosis not present

## 2017-04-17 DIAGNOSIS — I252 Old myocardial infarction: Secondary | ICD-10-CM | POA: Diagnosis not present

## 2017-04-17 DIAGNOSIS — I5032 Chronic diastolic (congestive) heart failure: Secondary | ICD-10-CM | POA: Diagnosis not present

## 2017-04-17 DIAGNOSIS — E785 Hyperlipidemia, unspecified: Secondary | ICD-10-CM | POA: Diagnosis not present

## 2017-04-17 DIAGNOSIS — I4891 Unspecified atrial fibrillation: Secondary | ICD-10-CM | POA: Diagnosis not present

## 2017-04-17 DIAGNOSIS — N183 Chronic kidney disease, stage 3 (moderate): Secondary | ICD-10-CM | POA: Diagnosis not present

## 2017-04-17 DIAGNOSIS — I110 Hypertensive heart disease with heart failure: Secondary | ICD-10-CM | POA: Diagnosis not present

## 2017-04-17 DIAGNOSIS — E1122 Type 2 diabetes mellitus with diabetic chronic kidney disease: Secondary | ICD-10-CM | POA: Diagnosis not present

## 2017-04-17 DIAGNOSIS — I10 Essential (primary) hypertension: Secondary | ICD-10-CM | POA: Diagnosis not present

## 2017-04-17 DIAGNOSIS — Z48812 Encounter for surgical aftercare following surgery on the circulatory system: Secondary | ICD-10-CM | POA: Diagnosis not present

## 2017-04-17 DIAGNOSIS — I2510 Atherosclerotic heart disease of native coronary artery without angina pectoris: Secondary | ICD-10-CM | POA: Diagnosis not present

## 2017-04-17 DIAGNOSIS — M1991 Primary osteoarthritis, unspecified site: Secondary | ICD-10-CM | POA: Diagnosis not present

## 2017-04-17 DIAGNOSIS — E782 Mixed hyperlipidemia: Secondary | ICD-10-CM | POA: Diagnosis not present

## 2017-04-18 DIAGNOSIS — Z48812 Encounter for surgical aftercare following surgery on the circulatory system: Secondary | ICD-10-CM | POA: Diagnosis not present

## 2017-04-18 DIAGNOSIS — D631 Anemia in chronic kidney disease: Secondary | ICD-10-CM | POA: Diagnosis not present

## 2017-04-18 DIAGNOSIS — I252 Old myocardial infarction: Secondary | ICD-10-CM | POA: Diagnosis not present

## 2017-04-18 DIAGNOSIS — M1991 Primary osteoarthritis, unspecified site: Secondary | ICD-10-CM | POA: Diagnosis not present

## 2017-04-18 DIAGNOSIS — N183 Chronic kidney disease, stage 3 (moderate): Secondary | ICD-10-CM | POA: Diagnosis not present

## 2017-04-18 DIAGNOSIS — I5032 Chronic diastolic (congestive) heart failure: Secondary | ICD-10-CM | POA: Diagnosis not present

## 2017-04-18 DIAGNOSIS — E1122 Type 2 diabetes mellitus with diabetic chronic kidney disease: Secondary | ICD-10-CM | POA: Diagnosis not present

## 2017-04-18 DIAGNOSIS — I4891 Unspecified atrial fibrillation: Secondary | ICD-10-CM | POA: Diagnosis not present

## 2017-04-18 DIAGNOSIS — E785 Hyperlipidemia, unspecified: Secondary | ICD-10-CM | POA: Diagnosis not present

## 2017-04-18 DIAGNOSIS — I2510 Atherosclerotic heart disease of native coronary artery without angina pectoris: Secondary | ICD-10-CM | POA: Diagnosis not present

## 2017-04-18 DIAGNOSIS — I110 Hypertensive heart disease with heart failure: Secondary | ICD-10-CM | POA: Diagnosis not present

## 2017-04-19 DIAGNOSIS — N183 Chronic kidney disease, stage 3 (moderate): Secondary | ICD-10-CM | POA: Diagnosis not present

## 2017-04-19 DIAGNOSIS — I252 Old myocardial infarction: Secondary | ICD-10-CM | POA: Diagnosis not present

## 2017-04-19 DIAGNOSIS — I10 Essential (primary) hypertension: Secondary | ICD-10-CM | POA: Diagnosis not present

## 2017-04-19 DIAGNOSIS — E1122 Type 2 diabetes mellitus with diabetic chronic kidney disease: Secondary | ICD-10-CM | POA: Diagnosis not present

## 2017-04-19 DIAGNOSIS — E782 Mixed hyperlipidemia: Secondary | ICD-10-CM | POA: Diagnosis not present

## 2017-04-22 DIAGNOSIS — I5032 Chronic diastolic (congestive) heart failure: Secondary | ICD-10-CM | POA: Diagnosis not present

## 2017-04-23 ENCOUNTER — Other Ambulatory Visit: Payer: Self-pay | Admitting: Cardiology

## 2017-04-23 DIAGNOSIS — Z48812 Encounter for surgical aftercare following surgery on the circulatory system: Secondary | ICD-10-CM | POA: Diagnosis not present

## 2017-04-23 DIAGNOSIS — I110 Hypertensive heart disease with heart failure: Secondary | ICD-10-CM | POA: Diagnosis not present

## 2017-04-23 DIAGNOSIS — E785 Hyperlipidemia, unspecified: Secondary | ICD-10-CM | POA: Diagnosis not present

## 2017-04-23 DIAGNOSIS — M1991 Primary osteoarthritis, unspecified site: Secondary | ICD-10-CM | POA: Diagnosis not present

## 2017-04-23 DIAGNOSIS — E1122 Type 2 diabetes mellitus with diabetic chronic kidney disease: Secondary | ICD-10-CM | POA: Diagnosis not present

## 2017-04-23 DIAGNOSIS — N183 Chronic kidney disease, stage 3 (moderate): Secondary | ICD-10-CM | POA: Diagnosis not present

## 2017-04-23 DIAGNOSIS — I252 Old myocardial infarction: Secondary | ICD-10-CM | POA: Diagnosis not present

## 2017-04-23 DIAGNOSIS — I4891 Unspecified atrial fibrillation: Secondary | ICD-10-CM | POA: Diagnosis not present

## 2017-04-23 DIAGNOSIS — I5032 Chronic diastolic (congestive) heart failure: Secondary | ICD-10-CM | POA: Diagnosis not present

## 2017-04-23 DIAGNOSIS — D631 Anemia in chronic kidney disease: Secondary | ICD-10-CM | POA: Diagnosis not present

## 2017-04-23 DIAGNOSIS — I2510 Atherosclerotic heart disease of native coronary artery without angina pectoris: Secondary | ICD-10-CM | POA: Diagnosis not present

## 2017-04-23 MED ORDER — CLONIDINE HCL 0.2 MG PO TABS: 0.2000 mg | ORAL_TABLET | Freq: Two times a day (BID) | ORAL | 3 refills | Status: DC

## 2017-04-23 NOTE — Telephone Encounter (Signed)
Refilled clonidine 0.2 mg BID tablets

## 2017-04-23 NOTE — Telephone Encounter (Signed)
° °  1. Which medications need to be refilled? (please list name of each medication and dose if known)  cloNIDine (CATAPRES) 0.2 MG tablet [[id number]]   2. Which pharmacy/location (including street and city if local pharmacy) is medication to be sent to? [place]  3. Do they need a 30 day or 90 day supply?   Pt was normally on the patch, they switched her in the hospital to the pills

## 2017-04-25 DIAGNOSIS — I5032 Chronic diastolic (congestive) heart failure: Secondary | ICD-10-CM | POA: Diagnosis not present

## 2017-04-25 DIAGNOSIS — I2510 Atherosclerotic heart disease of native coronary artery without angina pectoris: Secondary | ICD-10-CM | POA: Diagnosis not present

## 2017-04-25 DIAGNOSIS — N183 Chronic kidney disease, stage 3 (moderate): Secondary | ICD-10-CM | POA: Diagnosis not present

## 2017-04-25 DIAGNOSIS — I252 Old myocardial infarction: Secondary | ICD-10-CM | POA: Diagnosis not present

## 2017-04-25 DIAGNOSIS — I110 Hypertensive heart disease with heart failure: Secondary | ICD-10-CM | POA: Diagnosis not present

## 2017-04-25 DIAGNOSIS — Z48812 Encounter for surgical aftercare following surgery on the circulatory system: Secondary | ICD-10-CM | POA: Diagnosis not present

## 2017-04-25 DIAGNOSIS — E785 Hyperlipidemia, unspecified: Secondary | ICD-10-CM | POA: Diagnosis not present

## 2017-04-25 DIAGNOSIS — E1122 Type 2 diabetes mellitus with diabetic chronic kidney disease: Secondary | ICD-10-CM | POA: Diagnosis not present

## 2017-04-25 DIAGNOSIS — M1991 Primary osteoarthritis, unspecified site: Secondary | ICD-10-CM | POA: Diagnosis not present

## 2017-04-25 DIAGNOSIS — I4891 Unspecified atrial fibrillation: Secondary | ICD-10-CM | POA: Diagnosis not present

## 2017-04-25 DIAGNOSIS — D631 Anemia in chronic kidney disease: Secondary | ICD-10-CM | POA: Diagnosis not present

## 2017-04-26 ENCOUNTER — Ambulatory Visit (INDEPENDENT_AMBULATORY_CARE_PROVIDER_SITE_OTHER): Payer: Medicare Other

## 2017-04-26 VITALS — BP 120/66 | HR 62 | Ht 61.0 in | Wt 169.8 lb

## 2017-04-26 DIAGNOSIS — I480 Paroxysmal atrial fibrillation: Secondary | ICD-10-CM

## 2017-04-26 DIAGNOSIS — R001 Bradycardia, unspecified: Secondary | ICD-10-CM

## 2017-04-26 NOTE — Progress Notes (Signed)
Pt came in with no complaints. I reviewed all medications and made changes to the ones that needed it. Done EKG- showed Stephanie Hendricks, [place].

## 2017-04-26 NOTE — Patient Instructions (Signed)
Medication Instructions:  Your physician recommends that you continue on your current medications as directed. Please refer to the Current Medication list given to you today.   Labwork: none  Testing/Procedures: none  Follow-Up: Your physician recommends that you schedule a follow-up appointment in: May 21, 2017 with Dr. Domenic Polite   Any Other Special Instructions Will Be Listed Below (If Applicable).     If you need a refill on your cardiac medications before your next appointment, please call your pharmacy.

## 2017-04-30 DIAGNOSIS — M1991 Primary osteoarthritis, unspecified site: Secondary | ICD-10-CM | POA: Diagnosis not present

## 2017-04-30 DIAGNOSIS — I4891 Unspecified atrial fibrillation: Secondary | ICD-10-CM | POA: Diagnosis not present

## 2017-04-30 DIAGNOSIS — I2510 Atherosclerotic heart disease of native coronary artery without angina pectoris: Secondary | ICD-10-CM | POA: Diagnosis not present

## 2017-04-30 DIAGNOSIS — E785 Hyperlipidemia, unspecified: Secondary | ICD-10-CM | POA: Diagnosis not present

## 2017-04-30 DIAGNOSIS — Z48812 Encounter for surgical aftercare following surgery on the circulatory system: Secondary | ICD-10-CM | POA: Diagnosis not present

## 2017-04-30 DIAGNOSIS — E1122 Type 2 diabetes mellitus with diabetic chronic kidney disease: Secondary | ICD-10-CM | POA: Diagnosis not present

## 2017-04-30 DIAGNOSIS — N183 Chronic kidney disease, stage 3 (moderate): Secondary | ICD-10-CM | POA: Diagnosis not present

## 2017-04-30 DIAGNOSIS — D631 Anemia in chronic kidney disease: Secondary | ICD-10-CM | POA: Diagnosis not present

## 2017-04-30 DIAGNOSIS — I110 Hypertensive heart disease with heart failure: Secondary | ICD-10-CM | POA: Diagnosis not present

## 2017-04-30 DIAGNOSIS — I252 Old myocardial infarction: Secondary | ICD-10-CM | POA: Diagnosis not present

## 2017-04-30 DIAGNOSIS — I5032 Chronic diastolic (congestive) heart failure: Secondary | ICD-10-CM | POA: Diagnosis not present

## 2017-05-02 DIAGNOSIS — Z48812 Encounter for surgical aftercare following surgery on the circulatory system: Secondary | ICD-10-CM | POA: Diagnosis not present

## 2017-05-02 DIAGNOSIS — I252 Old myocardial infarction: Secondary | ICD-10-CM | POA: Diagnosis not present

## 2017-05-02 DIAGNOSIS — I110 Hypertensive heart disease with heart failure: Secondary | ICD-10-CM | POA: Diagnosis not present

## 2017-05-02 DIAGNOSIS — I4891 Unspecified atrial fibrillation: Secondary | ICD-10-CM | POA: Diagnosis not present

## 2017-05-02 DIAGNOSIS — E1122 Type 2 diabetes mellitus with diabetic chronic kidney disease: Secondary | ICD-10-CM | POA: Diagnosis not present

## 2017-05-02 DIAGNOSIS — M1991 Primary osteoarthritis, unspecified site: Secondary | ICD-10-CM | POA: Diagnosis not present

## 2017-05-02 DIAGNOSIS — I5032 Chronic diastolic (congestive) heart failure: Secondary | ICD-10-CM | POA: Diagnosis not present

## 2017-05-02 DIAGNOSIS — E785 Hyperlipidemia, unspecified: Secondary | ICD-10-CM | POA: Diagnosis not present

## 2017-05-02 DIAGNOSIS — N183 Chronic kidney disease, stage 3 (moderate): Secondary | ICD-10-CM | POA: Diagnosis not present

## 2017-05-02 DIAGNOSIS — I2510 Atherosclerotic heart disease of native coronary artery without angina pectoris: Secondary | ICD-10-CM | POA: Diagnosis not present

## 2017-05-02 DIAGNOSIS — D631 Anemia in chronic kidney disease: Secondary | ICD-10-CM | POA: Diagnosis not present

## 2017-05-03 DIAGNOSIS — R944 Abnormal results of kidney function studies: Secondary | ICD-10-CM | POA: Diagnosis not present

## 2017-05-06 DIAGNOSIS — Z48812 Encounter for surgical aftercare following surgery on the circulatory system: Secondary | ICD-10-CM | POA: Diagnosis not present

## 2017-05-06 DIAGNOSIS — M1991 Primary osteoarthritis, unspecified site: Secondary | ICD-10-CM | POA: Diagnosis not present

## 2017-05-06 DIAGNOSIS — I252 Old myocardial infarction: Secondary | ICD-10-CM | POA: Diagnosis not present

## 2017-05-06 DIAGNOSIS — I110 Hypertensive heart disease with heart failure: Secondary | ICD-10-CM | POA: Diagnosis not present

## 2017-05-06 DIAGNOSIS — I5032 Chronic diastolic (congestive) heart failure: Secondary | ICD-10-CM | POA: Diagnosis not present

## 2017-05-06 DIAGNOSIS — N183 Chronic kidney disease, stage 3 (moderate): Secondary | ICD-10-CM | POA: Diagnosis not present

## 2017-05-06 DIAGNOSIS — D631 Anemia in chronic kidney disease: Secondary | ICD-10-CM | POA: Diagnosis not present

## 2017-05-06 DIAGNOSIS — I4891 Unspecified atrial fibrillation: Secondary | ICD-10-CM | POA: Diagnosis not present

## 2017-05-06 DIAGNOSIS — E1122 Type 2 diabetes mellitus with diabetic chronic kidney disease: Secondary | ICD-10-CM | POA: Diagnosis not present

## 2017-05-06 DIAGNOSIS — I2510 Atherosclerotic heart disease of native coronary artery without angina pectoris: Secondary | ICD-10-CM | POA: Diagnosis not present

## 2017-05-06 DIAGNOSIS — E785 Hyperlipidemia, unspecified: Secondary | ICD-10-CM | POA: Diagnosis not present

## 2017-05-07 DIAGNOSIS — I252 Old myocardial infarction: Secondary | ICD-10-CM | POA: Diagnosis not present

## 2017-05-07 DIAGNOSIS — D631 Anemia in chronic kidney disease: Secondary | ICD-10-CM | POA: Diagnosis not present

## 2017-05-07 DIAGNOSIS — I4891 Unspecified atrial fibrillation: Secondary | ICD-10-CM | POA: Diagnosis not present

## 2017-05-07 DIAGNOSIS — E785 Hyperlipidemia, unspecified: Secondary | ICD-10-CM | POA: Diagnosis not present

## 2017-05-07 DIAGNOSIS — I5032 Chronic diastolic (congestive) heart failure: Secondary | ICD-10-CM | POA: Diagnosis not present

## 2017-05-07 DIAGNOSIS — I2510 Atherosclerotic heart disease of native coronary artery without angina pectoris: Secondary | ICD-10-CM | POA: Diagnosis not present

## 2017-05-07 DIAGNOSIS — Z48812 Encounter for surgical aftercare following surgery on the circulatory system: Secondary | ICD-10-CM | POA: Diagnosis not present

## 2017-05-07 DIAGNOSIS — E1122 Type 2 diabetes mellitus with diabetic chronic kidney disease: Secondary | ICD-10-CM | POA: Diagnosis not present

## 2017-05-07 DIAGNOSIS — I110 Hypertensive heart disease with heart failure: Secondary | ICD-10-CM | POA: Diagnosis not present

## 2017-05-07 DIAGNOSIS — N183 Chronic kidney disease, stage 3 (moderate): Secondary | ICD-10-CM | POA: Diagnosis not present

## 2017-05-07 DIAGNOSIS — M1991 Primary osteoarthritis, unspecified site: Secondary | ICD-10-CM | POA: Diagnosis not present

## 2017-05-08 DIAGNOSIS — M1991 Primary osteoarthritis, unspecified site: Secondary | ICD-10-CM | POA: Diagnosis not present

## 2017-05-08 DIAGNOSIS — I4891 Unspecified atrial fibrillation: Secondary | ICD-10-CM | POA: Diagnosis not present

## 2017-05-08 DIAGNOSIS — N183 Chronic kidney disease, stage 3 (moderate): Secondary | ICD-10-CM | POA: Diagnosis not present

## 2017-05-08 DIAGNOSIS — I2510 Atherosclerotic heart disease of native coronary artery without angina pectoris: Secondary | ICD-10-CM | POA: Diagnosis not present

## 2017-05-08 DIAGNOSIS — I252 Old myocardial infarction: Secondary | ICD-10-CM | POA: Diagnosis not present

## 2017-05-08 DIAGNOSIS — I110 Hypertensive heart disease with heart failure: Secondary | ICD-10-CM | POA: Diagnosis not present

## 2017-05-08 DIAGNOSIS — D631 Anemia in chronic kidney disease: Secondary | ICD-10-CM | POA: Diagnosis not present

## 2017-05-08 DIAGNOSIS — E1122 Type 2 diabetes mellitus with diabetic chronic kidney disease: Secondary | ICD-10-CM | POA: Diagnosis not present

## 2017-05-08 DIAGNOSIS — I5032 Chronic diastolic (congestive) heart failure: Secondary | ICD-10-CM | POA: Diagnosis not present

## 2017-05-08 DIAGNOSIS — Z48812 Encounter for surgical aftercare following surgery on the circulatory system: Secondary | ICD-10-CM | POA: Diagnosis not present

## 2017-05-08 DIAGNOSIS — E785 Hyperlipidemia, unspecified: Secondary | ICD-10-CM | POA: Diagnosis not present

## 2017-05-10 DIAGNOSIS — I4891 Unspecified atrial fibrillation: Secondary | ICD-10-CM | POA: Diagnosis not present

## 2017-05-10 DIAGNOSIS — N183 Chronic kidney disease, stage 3 (moderate): Secondary | ICD-10-CM | POA: Diagnosis not present

## 2017-05-10 DIAGNOSIS — E785 Hyperlipidemia, unspecified: Secondary | ICD-10-CM | POA: Diagnosis not present

## 2017-05-10 DIAGNOSIS — I252 Old myocardial infarction: Secondary | ICD-10-CM | POA: Diagnosis not present

## 2017-05-10 DIAGNOSIS — E1122 Type 2 diabetes mellitus with diabetic chronic kidney disease: Secondary | ICD-10-CM | POA: Diagnosis not present

## 2017-05-10 DIAGNOSIS — I5032 Chronic diastolic (congestive) heart failure: Secondary | ICD-10-CM | POA: Diagnosis not present

## 2017-05-10 DIAGNOSIS — I110 Hypertensive heart disease with heart failure: Secondary | ICD-10-CM | POA: Diagnosis not present

## 2017-05-10 DIAGNOSIS — M1991 Primary osteoarthritis, unspecified site: Secondary | ICD-10-CM | POA: Diagnosis not present

## 2017-05-10 DIAGNOSIS — Z48812 Encounter for surgical aftercare following surgery on the circulatory system: Secondary | ICD-10-CM | POA: Diagnosis not present

## 2017-05-10 DIAGNOSIS — D631 Anemia in chronic kidney disease: Secondary | ICD-10-CM | POA: Diagnosis not present

## 2017-05-10 DIAGNOSIS — I2510 Atherosclerotic heart disease of native coronary artery without angina pectoris: Secondary | ICD-10-CM | POA: Diagnosis not present

## 2017-05-15 DIAGNOSIS — I110 Hypertensive heart disease with heart failure: Secondary | ICD-10-CM | POA: Diagnosis not present

## 2017-05-15 DIAGNOSIS — Z48812 Encounter for surgical aftercare following surgery on the circulatory system: Secondary | ICD-10-CM | POA: Diagnosis not present

## 2017-05-15 DIAGNOSIS — M1991 Primary osteoarthritis, unspecified site: Secondary | ICD-10-CM | POA: Diagnosis not present

## 2017-05-15 DIAGNOSIS — I5032 Chronic diastolic (congestive) heart failure: Secondary | ICD-10-CM | POA: Diagnosis not present

## 2017-05-15 DIAGNOSIS — I4891 Unspecified atrial fibrillation: Secondary | ICD-10-CM | POA: Diagnosis not present

## 2017-05-15 DIAGNOSIS — E785 Hyperlipidemia, unspecified: Secondary | ICD-10-CM | POA: Diagnosis not present

## 2017-05-15 DIAGNOSIS — I252 Old myocardial infarction: Secondary | ICD-10-CM | POA: Diagnosis not present

## 2017-05-15 DIAGNOSIS — N183 Chronic kidney disease, stage 3 (moderate): Secondary | ICD-10-CM | POA: Diagnosis not present

## 2017-05-15 DIAGNOSIS — E1122 Type 2 diabetes mellitus with diabetic chronic kidney disease: Secondary | ICD-10-CM | POA: Diagnosis not present

## 2017-05-15 DIAGNOSIS — I2510 Atherosclerotic heart disease of native coronary artery without angina pectoris: Secondary | ICD-10-CM | POA: Diagnosis not present

## 2017-05-15 DIAGNOSIS — D631 Anemia in chronic kidney disease: Secondary | ICD-10-CM | POA: Diagnosis not present

## 2017-05-17 DIAGNOSIS — I5032 Chronic diastolic (congestive) heart failure: Secondary | ICD-10-CM | POA: Diagnosis not present

## 2017-05-17 DIAGNOSIS — I2510 Atherosclerotic heart disease of native coronary artery without angina pectoris: Secondary | ICD-10-CM | POA: Diagnosis not present

## 2017-05-17 DIAGNOSIS — E785 Hyperlipidemia, unspecified: Secondary | ICD-10-CM | POA: Diagnosis not present

## 2017-05-17 DIAGNOSIS — M1991 Primary osteoarthritis, unspecified site: Secondary | ICD-10-CM | POA: Diagnosis not present

## 2017-05-17 DIAGNOSIS — I4891 Unspecified atrial fibrillation: Secondary | ICD-10-CM | POA: Diagnosis not present

## 2017-05-17 DIAGNOSIS — I252 Old myocardial infarction: Secondary | ICD-10-CM | POA: Diagnosis not present

## 2017-05-17 DIAGNOSIS — E1122 Type 2 diabetes mellitus with diabetic chronic kidney disease: Secondary | ICD-10-CM | POA: Diagnosis not present

## 2017-05-17 DIAGNOSIS — D631 Anemia in chronic kidney disease: Secondary | ICD-10-CM | POA: Diagnosis not present

## 2017-05-17 DIAGNOSIS — N183 Chronic kidney disease, stage 3 (moderate): Secondary | ICD-10-CM | POA: Diagnosis not present

## 2017-05-17 DIAGNOSIS — I110 Hypertensive heart disease with heart failure: Secondary | ICD-10-CM | POA: Diagnosis not present

## 2017-05-17 DIAGNOSIS — Z48812 Encounter for surgical aftercare following surgery on the circulatory system: Secondary | ICD-10-CM | POA: Diagnosis not present

## 2017-05-20 ENCOUNTER — Encounter: Payer: Self-pay | Admitting: Cardiology

## 2017-05-20 NOTE — Progress Notes (Signed)
Cardiology Office Note  Date: 05/21/2017   ID: Stephanie Hendricks, DOB 1941/04/30, MRN [id number]  PCP: Celene Squibb, MD  Primary Cardiologist: Rozann Lesches, MD   Chief Complaint  Patient presents with  . Coronary Artery Disease    History of Present Illness: Stephanie Hendricks is a 76 y.o. female that I last saw in January and clinically stable at that point. Extensive interval records reviewed. She is status post NSTEMI in late February with documentation of severe LM disease, underwent emergent CABG with Dr. Servando Snare. Office follow-up with Ms. Stephanie Hendricks noted in March. She had follow-up with [facility] in April.   She is here today with family member for a routine visit. States that she is doing well, completes home PT very soon. We discussed her diet and also a walking plan. Still has some residual postsurgical paresthesias in her chest. No angina symptoms or significant shortness of breath.  We reviewed her medications. Cardiac regimen includes aspirin, Norvasc, clonidine, Lasix with potassium supplements, and Zocor. Most recent lipid panel is outlined below.  Past Medical History:  Diagnosis Date  . Aortic stenosis    Minimal  . Bradycardia   . CAD (coronary artery disease)    Severe LM, LAD, and OM1 disease status post emergent CABG February 2018  . Chronic kidney disease, stage III (moderate)   . Degenerative joint disease   . Diabetes mellitus type II   . Essential hypertension, benign   . Hyperlipidemia   . Lower extremity edema   . Microcytic anemia   . NSTEMI (non-ST elevated myocardial infarction) ([facility]) 01/2017  . Palpitations   . Postoperative atrial fibrillation [facility])     Past Surgical History:  Procedure Laterality Date  . COLONOSCOPY  08/28/2011   sigmoid colon serated adenoma, next colonoscopy 08/2016  . COLONOSCOPY W/ POLYPECTOMY  2005   Dr. Tamela Oddi resected per patient  . CORONARY ARTERY BYPASS GRAFT N/A 02/16/2017   Procedure: CORONARY ARTERY BYPASS  GRAFTING (CABG) x 3 (LIMA to LAD, SVG SEQUENTIALLY to RAMUS INTERMEDIATE and DISTAL CIRCUMFLEX) WITH ENDOSCOPIC HARVESTING OF RIGHT SAPHENOUS VEIN;  Surgeon: Grace Isaac, MD;  Location: [facility];  Service: Open Heart Surgery;  Laterality: N/A;  . ESOPHAGOGASTRODUODENOSCOPY     remote, foreign body extraction  . ESOPHAGOGASTRODUODENOSCOPY  08/28/2011   hiatal hernia, erosion on fold of diaphragmatic hiatus (cameron lesion), antral and prepylori erosions, SB bx negative, gastric bx showed reactive gastropathy but no H.Pylori  . HEMORROIDECTOMY    . LEFT HEART CATH AND CORONARY ANGIOGRAPHY N/A 02/16/2017   Procedure: Left Heart Cath and Coronary Angiography;  Surgeon: Burnell Blanks, MD;  Location: [facility] [facility];  Service: Cardiovascular;  Laterality: N/A;  . TONSILLECTOMY    . TUBAL LIGATION      Current Outpatient Prescriptions  Medication Sig Dispense Refill  . acetaminophen (TYLENOL) 325 MG tablet Take 2 tablets (650 mg total) by mouth every 6 (six) hours as needed for mild pain.    Marland Kitchen amLODipine (NORVASC) 10 MG tablet Take 1 tablet (10 mg total) by mouth daily.    Marland Kitchen aspirin 81 MG tablet Take 81 mg by mouth daily.      . cloNIDine (CATAPRES) 0.2 MG tablet Take 1 tablet (0.2 mg total) by mouth 2 (two) times daily. 180 tablet 3  . furosemide (LASIX) 20 MG tablet Take 60 mg by mouth 2 (two) times daily.     Marland Kitchen glipiZIDE (GLIPIZIDE XL) 5 MG 24 hr tablet Take 1  tablet (5 mg total) by mouth daily with breakfast. Do not take this medication if your blood sugar is less than 110.    Marland Kitchen JENTADUETO 2.5-500 MG TABS 5 mg daily.     . potassium chloride SA (K-DUR,KLOR-CON) 20 MEQ tablet Take 2 tablets (40 mEq total) by mouth daily.    . simvastatin (ZOCOR) 20 MG tablet TAKE (1) TABLET BY MOUTH ONCE DAILY AT BEDTIME FOR CHOLESTEROL. 90 tablet 3   No current facility-administered medications for this visit.    Allergies:  Patient has no known allergies.   Social History: The patient  reports  that she has never smoked. She has never used smokeless tobacco. She reports that she does not drink alcohol or use drugs.   ROS:  Please see the history of present illness. Otherwise, complete review of systems is positive for none.  All other systems are reviewed and negative.   Physical Exam: VS:  BP 130/70   Pulse (!) 49   Ht 5\' 1"  (1.549 m)   Wt 167 lb (75.8 kg)   SpO2 96%   BMI 31.55 kg/m , BMI Body mass index is 31.55 kg/m.  Wt Readings from Last 3 Encounters:  05/21/17 167 lb (75.8 kg)  04/26/17 169 lb 12.8 oz (77 kg)  04/08/17 166 lb (75.3 kg)    General: Overweight woman, appears comfortable at rest. HEENT: Conjunctiva and lids normal, oropharynx clear. Neck: Supple, no elevated JVP or carotid bruits, no thyromegaly. Lungs: Clear to auscultation, nonlabored breathing at rest. Thorax: Well-healed sternal incision with keloid. Cardiac: Regular rate and rhythm, no S3, 2/6 systolic murmur, no pericardial rub. Abdomen: Soft, nontender, bowel sounds present, no guarding or rebound. Extremities: No pitting edema, distal pulses 2+. Skin: Warm and dry. Musculoskeletal: No kyphosis. Neuropsychiatric: Alert and oriented 3, affect appropriate.  ECG: I personally reviewed the tracing from 04/26/2017 which showed atrial bradycardia with low voltage and NSST changes.  Recent Labwork: 02/16/2017: TSH 0.785 02/27/2017: B Natriuretic Peptide 1,019.4 03/01/2017: Magnesium 1.9 03/20/2017: ALT 23; AST 18; BUN 21; Creatinine, Ser 1.54; Hemoglobin 9.3; Platelets 292; Potassium 3.9; Sodium 141     Component Value Date/Time   CHOL 70 02/16/2017 1300   TRIG 62 02/16/2017 1300   HDL 22 (L) 02/16/2017 1300   CHOLHDL 3.2 02/16/2017 1300   VLDL 12 02/16/2017 1300   LDLCALC 36 02/16/2017 1300    Other Studies Reviewed Today:  Echocardiogram 02/21/2017: Study Conclusions  - Left ventricle: The cavity size was normal. There was moderate   concentric hypertrophy. Systolic function was vigorous.  The   estimated ejection fraction was in the range of 65% to 70%. Wall   motion was normal; there were no regional wall motion   abnormalities. Doppler parameters are consistent with abnormal   left ventricular relaxation (grade 1 diastolic dysfunction). - Aortic valve: There was mild stenosis. Peak velocity (S): 249   cm/s. Mean gradient (S): 12 mm Hg. Valve area (VTI): 1.65 cm^2.   Valve area (Vmax): 1.56 cm^2. Valve area (Vmean): 1.64 cm^2. - Left atrium: The atrium was mildly dilated. Volume/bsa, S: 35   ml/m^2. - Tricuspid valve: There was moderate regurgitation. - Pulmonary arteries: Systolic pressure was mildly increased.  Cardiac catheterization 02/16/2017:  Prox RCA to Mid RCA lesion, 10 %stenosed.  Ost LM to LM lesion, 99 %stenosed.  Ost Cx to Prox Cx lesion, 50 %stenosed.  Ost 1st Mrg to 1st Mrg lesion, 90 %stenosed.  Prox LAD lesion, 80 %stenosed.   1. Unstable angina/NSTEMI  2. Severe distal left main artery stenosis 3. Severe stenosis proximal LAD 4. Severe stenosis ostium OM1 5. Elevated LVEDP  Chest x-ray 04/08/2017: FINDINGS: The cardio pericardial silhouette is enlarged. Stable asymmetric elevation left hemidiaphragm. There is bibasilar atelectasis. No pulmonary edema or pleural effusion. The visualized bony structures of the thorax are intact.  IMPRESSION: Cardiomegaly with mild vascular congestion and bibasilar atelectasis.  Assessment and Plan:  1. CAD status post CABG in February of this year following NSTEMI. She underwent subsequent nursing home based rehabilitation and most recently home PT. Progressing fairly well at this point. We did talk about a basic walking plan and also her diet today. No changes in current medications.  2. Mild aortic stenosis by echocardiogram in March, asymptomatic.  3. Hyperlipidemia, on Zocor. LDL 36 in February.  4. Essential hypertension, systolic blood pressure in the 130s today. No changes were made.  Current  medicines were reviewed with the patient today.  Disposition: Follow-up in 6 months.  Signed, Satira Sark, MD, [facility] 05/21/2017 1:37 PM    [facility] at [facility] [address] [place], [place] [address] Phone: [phone]; Fax: [phone]

## 2017-05-21 ENCOUNTER — Encounter: Payer: Self-pay | Admitting: Cardiology

## 2017-05-21 ENCOUNTER — Ambulatory Visit (INDEPENDENT_AMBULATORY_CARE_PROVIDER_SITE_OTHER): Payer: Medicare Other | Admitting: Cardiology

## 2017-05-21 VITALS — BP 130/70 | HR 49 | Ht 61.0 in | Wt 167.0 lb

## 2017-05-21 DIAGNOSIS — I252 Old myocardial infarction: Secondary | ICD-10-CM | POA: Diagnosis not present

## 2017-05-21 DIAGNOSIS — E782 Mixed hyperlipidemia: Secondary | ICD-10-CM | POA: Diagnosis not present

## 2017-05-21 DIAGNOSIS — I2510 Atherosclerotic heart disease of native coronary artery without angina pectoris: Secondary | ICD-10-CM | POA: Diagnosis not present

## 2017-05-21 DIAGNOSIS — M1991 Primary osteoarthritis, unspecified site: Secondary | ICD-10-CM | POA: Diagnosis not present

## 2017-05-21 DIAGNOSIS — I350 Nonrheumatic aortic (valve) stenosis: Secondary | ICD-10-CM

## 2017-05-21 DIAGNOSIS — I25119 Atherosclerotic heart disease of native coronary artery with unspecified angina pectoris: Secondary | ICD-10-CM

## 2017-05-21 DIAGNOSIS — E1122 Type 2 diabetes mellitus with diabetic chronic kidney disease: Secondary | ICD-10-CM | POA: Diagnosis not present

## 2017-05-21 DIAGNOSIS — D631 Anemia in chronic kidney disease: Secondary | ICD-10-CM | POA: Diagnosis not present

## 2017-05-21 DIAGNOSIS — I110 Hypertensive heart disease with heart failure: Secondary | ICD-10-CM | POA: Diagnosis not present

## 2017-05-21 DIAGNOSIS — I10 Essential (primary) hypertension: Secondary | ICD-10-CM | POA: Diagnosis not present

## 2017-05-21 DIAGNOSIS — I4891 Unspecified atrial fibrillation: Secondary | ICD-10-CM | POA: Diagnosis not present

## 2017-05-21 DIAGNOSIS — Z48812 Encounter for surgical aftercare following surgery on the circulatory system: Secondary | ICD-10-CM | POA: Diagnosis not present

## 2017-05-21 DIAGNOSIS — E785 Hyperlipidemia, unspecified: Secondary | ICD-10-CM | POA: Diagnosis not present

## 2017-05-21 DIAGNOSIS — N183 Chronic kidney disease, stage 3 (moderate): Secondary | ICD-10-CM | POA: Diagnosis not present

## 2017-05-21 DIAGNOSIS — I5032 Chronic diastolic (congestive) heart failure: Secondary | ICD-10-CM | POA: Diagnosis not present

## 2017-05-21 NOTE — Patient Instructions (Signed)
Your physician wants you to follow-up in: 6 months Dr Ferne Reus will receive a reminder letter in the mail two months in advance. If you don't receive a letter, please call our office to schedule the follow-up appointment.   Your physician wants you to follow-up in:  You will receive a reminder letter in the mail two months in advance. If you don't receive a letter, please call our office to schedule the follow-up appointment.    If you need a refill on your cardiac medications before your next appointment, please call your pharmacy.      Thank you for choosing [facility] !

## 2017-05-22 DIAGNOSIS — H31092 Other chorioretinal scars, left eye: Secondary | ICD-10-CM | POA: Diagnosis not present

## 2017-05-22 DIAGNOSIS — I5032 Chronic diastolic (congestive) heart failure: Secondary | ICD-10-CM | POA: Diagnosis not present

## 2017-05-22 DIAGNOSIS — H3321 Serous retinal detachment, right eye: Secondary | ICD-10-CM | POA: Diagnosis not present

## 2017-05-22 DIAGNOSIS — H3530 Unspecified macular degeneration: Secondary | ICD-10-CM | POA: Diagnosis not present

## 2017-05-23 DIAGNOSIS — E785 Hyperlipidemia, unspecified: Secondary | ICD-10-CM | POA: Diagnosis not present

## 2017-05-23 DIAGNOSIS — Z48812 Encounter for surgical aftercare following surgery on the circulatory system: Secondary | ICD-10-CM | POA: Diagnosis not present

## 2017-05-23 DIAGNOSIS — I4891 Unspecified atrial fibrillation: Secondary | ICD-10-CM | POA: Diagnosis not present

## 2017-05-23 DIAGNOSIS — I110 Hypertensive heart disease with heart failure: Secondary | ICD-10-CM | POA: Diagnosis not present

## 2017-05-23 DIAGNOSIS — E1122 Type 2 diabetes mellitus with diabetic chronic kidney disease: Secondary | ICD-10-CM | POA: Diagnosis not present

## 2017-05-23 DIAGNOSIS — I252 Old myocardial infarction: Secondary | ICD-10-CM | POA: Diagnosis not present

## 2017-05-23 DIAGNOSIS — D631 Anemia in chronic kidney disease: Secondary | ICD-10-CM | POA: Diagnosis not present

## 2017-05-23 DIAGNOSIS — I5032 Chronic diastolic (congestive) heart failure: Secondary | ICD-10-CM | POA: Diagnosis not present

## 2017-05-23 DIAGNOSIS — I2510 Atherosclerotic heart disease of native coronary artery without angina pectoris: Secondary | ICD-10-CM | POA: Diagnosis not present

## 2017-05-23 DIAGNOSIS — M1991 Primary osteoarthritis, unspecified site: Secondary | ICD-10-CM | POA: Diagnosis not present

## 2017-05-23 DIAGNOSIS — N183 Chronic kidney disease, stage 3 (moderate): Secondary | ICD-10-CM | POA: Diagnosis not present

## 2017-05-27 NOTE — Addendum Note (Signed)
Addendum  created 05/27/17 1139 by Oleta Mouse, MD   Sign clinical note

## 2017-06-22 DIAGNOSIS — I5032 Chronic diastolic (congestive) heart failure: Secondary | ICD-10-CM | POA: Diagnosis not present

## 2017-07-22 DIAGNOSIS — I5032 Chronic diastolic (congestive) heart failure: Secondary | ICD-10-CM | POA: Diagnosis not present

## 2017-08-15 DIAGNOSIS — D509 Iron deficiency anemia, unspecified: Secondary | ICD-10-CM | POA: Diagnosis not present

## 2017-08-15 DIAGNOSIS — I10 Essential (primary) hypertension: Secondary | ICD-10-CM | POA: Diagnosis not present

## 2017-08-15 DIAGNOSIS — E1122 Type 2 diabetes mellitus with diabetic chronic kidney disease: Secondary | ICD-10-CM | POA: Diagnosis not present

## 2017-08-19 DIAGNOSIS — N183 Chronic kidney disease, stage 3 (moderate): Secondary | ICD-10-CM | POA: Diagnosis not present

## 2017-08-19 DIAGNOSIS — E782 Mixed hyperlipidemia: Secondary | ICD-10-CM | POA: Diagnosis not present

## 2017-08-19 DIAGNOSIS — E1122 Type 2 diabetes mellitus with diabetic chronic kidney disease: Secondary | ICD-10-CM | POA: Diagnosis not present

## 2017-08-19 DIAGNOSIS — I10 Essential (primary) hypertension: Secondary | ICD-10-CM | POA: Diagnosis not present

## 2017-08-19 DIAGNOSIS — D509 Iron deficiency anemia, unspecified: Secondary | ICD-10-CM | POA: Diagnosis not present

## 2017-08-22 DIAGNOSIS — I5032 Chronic diastolic (congestive) heart failure: Secondary | ICD-10-CM | POA: Diagnosis not present

## 2017-09-11 DIAGNOSIS — H3530 Unspecified macular degeneration: Secondary | ICD-10-CM | POA: Diagnosis not present

## 2017-09-11 DIAGNOSIS — H31092 Other chorioretinal scars, left eye: Secondary | ICD-10-CM | POA: Diagnosis not present

## 2017-09-11 DIAGNOSIS — H2513 Age-related nuclear cataract, bilateral: Secondary | ICD-10-CM | POA: Diagnosis not present

## 2017-09-11 DIAGNOSIS — H3321 Serous retinal detachment, right eye: Secondary | ICD-10-CM | POA: Diagnosis not present

## 2017-09-11 DIAGNOSIS — H31421 Serous choroidal detachment, right eye: Secondary | ICD-10-CM | POA: Diagnosis not present

## 2017-09-22 DIAGNOSIS — I5032 Chronic diastolic (congestive) heart failure: Secondary | ICD-10-CM | POA: Diagnosis not present

## 2017-10-10 ENCOUNTER — Encounter: Payer: Medicare Other | Admitting: Cardiothoracic Surgery

## 2017-10-17 ENCOUNTER — Ambulatory Visit (INDEPENDENT_AMBULATORY_CARE_PROVIDER_SITE_OTHER): Payer: Medicare Other | Admitting: Cardiothoracic Surgery

## 2017-10-17 ENCOUNTER — Encounter: Payer: Self-pay | Admitting: Cardiothoracic Surgery

## 2017-10-17 VITALS — BP 140/77 | HR 72 | Ht 61.0 in | Wt 168.0 lb

## 2017-10-17 DIAGNOSIS — I2510 Atherosclerotic heart disease of native coronary artery without angina pectoris: Secondary | ICD-10-CM

## 2017-10-17 DIAGNOSIS — Z951 Presence of aortocoronary bypass graft: Secondary | ICD-10-CM

## 2017-10-17 NOTE — Progress Notes (Signed)
[address]       [place] [address]             [phone]      Stephanie Hendricks [place] Medical Record #[id number] Date of Birth: 13-Jan-1941  Referring: Burnell Blanks* Primary Care: Celene Squibb, MD  Chief Complaint:   POST OP FOLLOW UP 02/16/2017  OPERATIVE REPORT PREOPERATIVE DIAGNOSIS:  Critical left main obstruction with cardiogenic shock. POSTOPERATIVE DIAGNOSIS:  Critical left main obstruction with cardiogenic shock. PROCEDURE:  Emergency coronary artery bypass grafting x3 with the left internal mammary to the left anterior descending coronary artery; reverse saphenous vein graft sequentially to the ramus and distal circumflex with right thigh and endovein harvesting. SURGEON:  [name], MD  History of Present Illness:     Patient has made good progress now 8 months after emergency coronary artery bypass grafting for critical left main disease and cardiogenic shock.  The patient continues to increase her activity.  She denies any recurrent angina or evidence of congestive heart failure.      Past Medical History:  Diagnosis Date  . Aortic stenosis    Minimal  . Bradycardia   . CAD (coronary artery disease)    Severe LM, LAD, and OM1 disease status post emergent CABG February 2018  . Chronic kidney disease, stage III (moderate) (H[facility])   . Degenerative joint disease   . Diabetes mellitus type II   . Essential hypertension, benign   . Hyperlipidemia   . Lower extremity edema   . Microcytic anemia   . NSTEMI (non-ST elevated myocardial infarction) ([facility]) 01/2017  . Palpitations   . Postoperative atrial fibrillation (H[facility])      History  Smoking Status  . Never Smoker  Smokeless Tobacco  . Never Used    History  Alcohol Use No     No Known Allergies  Current Outpatient Prescriptions  Medication Sig Dispense Refill  . acetaminophen (TYLENOL) 325 MG tablet Take 2 tablets (650 mg total) by mouth every 6  (six) hours as needed for mild pain.    Marland Kitchen amLODipine (NORVASC) 10 MG tablet Take 1 tablet (10 mg total) by mouth daily.    Marland Kitchen aspirin 81 MG tablet Take 81 mg by mouth daily.      . cloNIDine (CATAPRES) 0.2 MG tablet Take 1 tablet (0.2 mg total) by mouth 2 (two) times daily. 180 tablet 3  . furosemide (LASIX) 20 MG tablet Take 60 mg by mouth 2 (two) times daily.     Marland Kitchen glipiZIDE (GLIPIZIDE XL) 5 MG 24 hr tablet Take 1 tablet (5 mg total) by mouth daily with breakfast. Do not take this medication if your blood sugar is less than 110.    Marland Kitchen JENTADUETO 2.5-500 MG TABS 5 mg daily.     . potassium chloride SA (K-DUR,KLOR-CON) 20 MEQ tablet Take 2 tablets (40 mEq total) by mouth daily.    . simvastatin (ZOCOR) 20 MG tablet TAKE (1) TABLET BY MOUTH ONCE DAILY AT BEDTIME FOR CHOLESTEROL. 90 tablet 3   No current facility-administered medications for this visit.        Physical Exam: BP 140/77   Pulse 72   Ht 5\' 1"  (1.549 m)   Wt 168 lb (76.2 kg)   SpO2 94%   BMI 31.74 kg/m   General appearance: alert, cooperative, appears stated age and no distress Neurologic: intact Heart: regular rate and rhythm, S1, S2 normal, no murmur, click, rub or  gallop Lungs: clear to auscultation bilaterally Abdomen: soft, non-tender; bowel sounds normal; no masses,  no organomegaly Extremities: extremities normal, atraumatic, no cyanosis or edema and Homans sign is negative, no sign of DVT Wound: Sternum is stable and well-healed she does have some keloid formation along the incision   Diagnostic Studies & Laboratory data: Most recent labs in the coming system  Recent Lab Findings: Lab Results  Component Value Date   WBC 10.2 03/20/2017   HGB 9.3 (L) 03/20/2017   HCT 30.4 (L) 03/20/2017   PLT 292 03/20/2017   GLUCOSE 60 (L) 03/20/2017   CHOL 70 02/16/2017   TRIG 62 02/16/2017   HDL 22 (L) 02/16/2017   LDLCALC 36 02/16/2017   ALT 23 03/20/2017   AST 18 03/20/2017   NA 141 03/20/2017   K 3.9  03/20/2017   CL 95 (L) 03/20/2017   CREATININE 1.54 (H) 03/20/2017   BUN 21 (H) 03/20/2017   CO2 37 (H) 03/20/2017   TSH 0.785 02/16/2017   INR 1.52 02/16/2017   HGBA1C 6.0 (H) 02/21/2017      Assessment / Plan:      Patient is stable after coronary artery bypass grafting done on emergency basis in February of this year. She continues on her medications and her family notes that she is diligent about taking them She has follow-up with cardiology next month Plan to see her back as needed      Grace Isaac MD      [address] [place] [address] Office [phone]   Beeper [phone]  10/17/2017 1:16 PM

## 2017-10-22 DIAGNOSIS — I5032 Chronic diastolic (congestive) heart failure: Secondary | ICD-10-CM | POA: Diagnosis not present

## 2017-11-22 DIAGNOSIS — I10 Essential (primary) hypertension: Secondary | ICD-10-CM | POA: Diagnosis not present

## 2017-11-22 DIAGNOSIS — E785 Hyperlipidemia, unspecified: Secondary | ICD-10-CM | POA: Diagnosis not present

## 2017-11-22 DIAGNOSIS — E1122 Type 2 diabetes mellitus with diabetic chronic kidney disease: Secondary | ICD-10-CM | POA: Diagnosis not present

## 2017-11-22 DIAGNOSIS — D509 Iron deficiency anemia, unspecified: Secondary | ICD-10-CM | POA: Diagnosis not present

## 2017-11-22 DIAGNOSIS — I5032 Chronic diastolic (congestive) heart failure: Secondary | ICD-10-CM | POA: Diagnosis not present

## 2017-11-25 DIAGNOSIS — I10 Essential (primary) hypertension: Secondary | ICD-10-CM | POA: Diagnosis not present

## 2017-11-26 DIAGNOSIS — E1122 Type 2 diabetes mellitus with diabetic chronic kidney disease: Secondary | ICD-10-CM | POA: Diagnosis not present

## 2017-11-26 DIAGNOSIS — E782 Mixed hyperlipidemia: Secondary | ICD-10-CM | POA: Diagnosis not present

## 2017-12-22 DIAGNOSIS — I5032 Chronic diastolic (congestive) heart failure: Secondary | ICD-10-CM | POA: Diagnosis not present

## 2017-12-25 DIAGNOSIS — E1122 Type 2 diabetes mellitus with diabetic chronic kidney disease: Secondary | ICD-10-CM | POA: Diagnosis not present

## 2018-01-06 NOTE — Progress Notes (Signed)
Cardiology Office Note  Date: 01/07/2018   ID: Stephanie Hendricks, DOB [birth year], MRN [id number]  PCP: Celene Squibb, MD  Primary Cardiologist: Rozann Lesches, MD   Chief Complaint  Patient presents with  . Coronary Artery Disease    History of Present Illness: Stephanie Hendricks is a 77 y.o. female last seen in May 2018. She presents today for a routine follow-up visit. Overall, continues to do well without any angina symptoms on medical therapy. She is functional with ADLs, reports NYHA class II dyspnea, no palpitations or syncope.  I reviewed her recent lab work per Dr. Nevada Crane as outlined below.  We went over her medications which are stable from a cardiac perspective and outlined below.  Past Medical History:  Diagnosis Date  . Aortic stenosis    Minimal  . Bradycardia   . CAD (coronary artery disease)    Severe LM, LAD, and OM1 disease status post emergent CABG February 2018  . Chronic kidney disease, stage III (moderate) (HCC)   . Degenerative joint disease   . Diabetes mellitus type II   . Essential hypertension, benign   . Hyperlipidemia   . Lower extremity edema   . Microcytic anemia   . NSTEMI (non-ST elevated myocardial infarction) ([facility]) 01/2017  . Palpitations   . Postoperative atrial fibrillation [facility])     Past Surgical History:  Procedure Laterality Date  . COLONOSCOPY  08/28/2011   sigmoid colon serated adenoma, next colonoscopy 08/2016  . COLONOSCOPY W/ POLYPECTOMY  2005   Dr. Tamela Oddi resected per patient  . CORONARY ARTERY BYPASS GRAFT N/A 02/16/2017   Procedure: CORONARY ARTERY BYPASS GRAFTING (CABG) x 3 (LIMA to LAD, SVG SEQUENTIALLY to RAMUS INTERMEDIATE and DISTAL CIRCUMFLEX) WITH ENDOSCOPIC HARVESTING OF RIGHT SAPHENOUS VEIN;  Surgeon: Grace Isaac, MD;  Location: [facility];  Service: Open Heart Surgery;  Laterality: N/A;  . ESOPHAGOGASTRODUODENOSCOPY     remote, foreign body extraction  . ESOPHAGOGASTRODUODENOSCOPY  08/28/2011   hiatal hernia,  erosion on fold of diaphragmatic hiatus (cameron lesion), antral and prepylori erosions, SB bx negative, gastric bx showed reactive gastropathy but no H.Pylori  . HEMORROIDECTOMY    . LEFT HEART CATH AND CORONARY ANGIOGRAPHY N/A 02/16/2017   Procedure: Left Heart Cath and Coronary Angiography;  Surgeon: Burnell Blanks, MD;  Location: [facility];  Service: Cardiovascular;  Laterality: N/A;  . TONSILLECTOMY    . TUBAL LIGATION      Current Outpatient Medications  Medication Sig Dispense Refill  . acetaminophen (TYLENOL) 325 MG tablet Take 2 tablets (650 mg total) by mouth every 6 (six) hours as needed for mild pain.    Marland Kitchen amLODipine (NORVASC) 10 MG tablet Take 1 tablet (10 mg total) by mouth daily.    Marland Kitchen aspirin 81 MG tablet Take 81 mg by mouth daily.      . cloNIDine (CATAPRES) 0.2 MG tablet Take 1 tablet (0.2 mg total) by mouth 2 (two) times daily. 180 tablet 3  . furosemide (LASIX) 20 MG tablet Take 60 mg by mouth 2 (two) times daily.     Marland Kitchen glipiZIDE (GLIPIZIDE XL) 5 MG 24 hr tablet Take 1 tablet (5 mg total) by mouth daily with breakfast. Do not take this medication if your blood sugar is less than 110.    Marland Kitchen potassium chloride SA (K-DUR,KLOR-CON) 20 MEQ tablet Take 2 tablets (40 mEq total) by mouth daily.    . simvastatin (ZOCOR) 20 MG tablet TAKE (1) TABLET BY MOUTH ONCE  DAILY AT BEDTIME FOR CHOLESTEROL. 90 tablet 3  . TRADJENTA 5 MG TABS tablet Take 5 mg by mouth daily.      No current facility-administered medications for this visit.    Allergies:  Patient has no known allergies.   Social History: The patient  reports that  has never smoked. she has never used smokeless tobacco. She reports that she does not drink alcohol or use drugs.   ROS:  Please see the history of present illness. Otherwise, complete review of systems is positive for arthritic pains.  All other systems are reviewed and negative.   Physical Exam: VS:  BP (!) 118/58   Pulse (!) 58   Ht 5\' 1"  (1.549  m)   Wt 172 lb 6.4 oz (78.2 kg)   SpO2 94%   BMI 32.57 kg/m , BMI Body mass index is 32.57 kg/m.  Wt Readings from Last 3 Encounters:  01/07/18 172 lb 6.4 oz (78.2 kg)  10/17/17 168 lb (76.2 kg)  05/21/17 167 lb (75.8 kg)    General: Elderly woman, appears comfortable at rest. HEENT: Conjunctiva and lids normal, oropharynx clear. Neck: Supple, no elevated JVP or carotid bruits, no thyromegaly. Lungs: Clear to auscultation, nonlabored breathing at rest. Cardiac: Regular rate and rhythm, no S3, 2/6 systolic murmur, no pericardial rub. Abdomen: Soft, nontender, bowel sounds present, no guarding or rebound. Extremities: No pitting edema, distal pulses 2+. Skin: Warm and dry. Musculoskeletal: No kyphosis. Neuropsychiatric: Alert and oriented x3, affect grossly appropriate.  ECG: I personally reviewed the tracing from 04/26/2017 which showed an ectopic atrial bradycardia with diffuse nonspecific ST-T changes and low voltage.  Recent Labwork: 02/16/2017: TSH 0.785 02/27/2017: B Natriuretic Peptide 1,019.4 03/01/2017: Magnesium 1.9 03/20/2017: ALT 23; AST 18; BUN 21; Creatinine, Ser 1.54; Hemoglobin 9.3; Platelets 292; Potassium 3.9; Sodium 141     Component Value Date/Time   CHOL 70 02/16/2017 1300   TRIG 62 02/16/2017 1300   HDL 22 (L) 02/16/2017 1300   CHOLHDL 3.2 02/16/2017 1300   VLDL 12 02/16/2017 1300   LDLCALC 36 02/16/2017 1300  December 2018: Hemoglobin 10.9, platelets 266, BUN 14, creatinine 1.47, potassium 3.9, AST 14, ALT 12, cholesterol 172, triglycerides 117, HDL 41, LDL 108, hemoglobin A1c 9.0  Other Studies Reviewed Today:  Echocardiogram 02/21/2017: Study Conclusions  - Left ventricle: The cavity size was normal. There was moderate   concentric hypertrophy. Systolic function was vigorous. The   estimated ejection fraction was in the range of 65% to 70%. Wall   motion was normal; there were no regional wall motion   abnormalities. Doppler parameters are consistent  with abnormal   left ventricular relaxation (grade 1 diastolic dysfunction). - Aortic valve: There was mild stenosis. Peak velocity (S): 249   cm/s. Mean gradient (S): 12 mm Hg. Valve area (VTI): 1.65 cm^2.   Valve area (Vmax): 1.56 cm^2. Valve area (Vmean): 1.64 cm^2. - Left atrium: The atrium was mildly dilated. Volume/bsa, S: 35   ml/m^2. - Tricuspid valve: There was moderate regurgitation. - Pulmonary arteries: Systolic pressure was mildly increased.  Assessment and Plan:  1. Symptomatic with stable CAD status post CABG in February 2018. She is back to baseline in terms of ADLs reports compliance with medical therapy.  2. Normal LVEF with mild aortic stenosis by echocardiogram last year. We will plan a follow-up echocardiogram prior to her next visit.  3. Mixed hyperlipidemia, continues on Zocor.  4. Essential hypertension, no changes made present regimen. Keep follow-up with Dr. Nevada Crane.  Current medicines were reviewed with the patient today.   Orders Placed This Encounter  Procedures  . ECHOCARDIOGRAM COMPLETE    Disposition: Follow-up in 6 months.  Signed, Satira Sark, MD, [facility] 01/07/2018 10:25 AM    [facility] [place], [place], [place] [address] Phone: [phone]; Fax: [phone]

## 2018-01-07 ENCOUNTER — Ambulatory Visit (INDEPENDENT_AMBULATORY_CARE_PROVIDER_SITE_OTHER): Payer: Medicare Other | Admitting: Cardiology

## 2018-01-07 ENCOUNTER — Encounter: Payer: Self-pay | Admitting: Cardiology

## 2018-01-07 VITALS — BP 118/58 | HR 58 | Ht 61.0 in | Wt 172.4 lb

## 2018-01-07 DIAGNOSIS — I25119 Atherosclerotic heart disease of native coronary artery with unspecified angina pectoris: Secondary | ICD-10-CM

## 2018-01-07 DIAGNOSIS — I350 Nonrheumatic aortic (valve) stenosis: Secondary | ICD-10-CM

## 2018-01-07 DIAGNOSIS — I10 Essential (primary) hypertension: Secondary | ICD-10-CM

## 2018-01-07 DIAGNOSIS — E782 Mixed hyperlipidemia: Secondary | ICD-10-CM

## 2018-01-07 NOTE — Patient Instructions (Signed)
Medication Instructions:  Your physician recommends that you continue on your current medications as directed. Please refer to the Current Medication list given to you today.  Labwork: NONE  Testing/Procedures: Your physician has requested that you have an echocardiogram IN 6 MONTHS. Echocardiography is a painless test that uses sound waves to create images of your heart. It provides your doctor with information about the size and shape of your heart and how well your heart's chambers and valves are working. This procedure takes approximately one hour. There are no restrictions for this procedure.  Follow-Up: Your physician wants you to follow-up in: 6 MONTHS WITH DR. MCDOWELL. You will receive a reminder letter in the mail two months in advance. If you don't receive a letter, please call our office to schedule the follow-up appointment.  Any Other Special Instructions Will Be Listed Below (If Applicable).  If you need a refill on your cardiac medications before your next appointment, please call your pharmacy. 

## 2018-01-15 DIAGNOSIS — E1122 Type 2 diabetes mellitus with diabetic chronic kidney disease: Secondary | ICD-10-CM | POA: Diagnosis not present

## 2018-01-16 ENCOUNTER — Other Ambulatory Visit: Payer: Self-pay | Admitting: Cardiology

## 2018-01-29 DIAGNOSIS — E1122 Type 2 diabetes mellitus with diabetic chronic kidney disease: Secondary | ICD-10-CM | POA: Diagnosis not present

## 2018-01-30 ENCOUNTER — Encounter (HOSPITAL_COMMUNITY): Payer: Self-pay

## 2018-01-30 ENCOUNTER — Other Ambulatory Visit: Payer: Self-pay

## 2018-01-30 ENCOUNTER — Emergency Department (HOSPITAL_COMMUNITY)
Admission: EM | Admit: 2018-01-30 | Discharge: 2018-01-30 | Disposition: A | Discharge status: Home / Self Care | Payer: Medicare Other | Attending: Emergency Medicine | Admitting: Emergency Medicine

## 2018-01-30 ENCOUNTER — Emergency Department (HOSPITAL_COMMUNITY): Payer: Medicare Other

## 2018-01-30 DIAGNOSIS — Z951 Presence of aortocoronary bypass graft: Secondary | ICD-10-CM | POA: Insufficient documentation

## 2018-01-30 DIAGNOSIS — I130 Hypertensive heart and chronic kidney disease with heart failure and stage 1 through stage 4 chronic kidney disease, or unspecified chronic kidney disease: Secondary | ICD-10-CM | POA: Diagnosis not present

## 2018-01-30 DIAGNOSIS — N183 Chronic kidney disease, stage 3 (moderate): Secondary | ICD-10-CM | POA: Diagnosis not present

## 2018-01-30 DIAGNOSIS — R5383 Other fatigue: Secondary | ICD-10-CM | POA: Diagnosis not present

## 2018-01-30 DIAGNOSIS — J111 Influenza due to unidentified influenza virus with other respiratory manifestations: Secondary | ICD-10-CM

## 2018-01-30 DIAGNOSIS — Z79899 Other long term (current) drug therapy: Secondary | ICD-10-CM | POA: Insufficient documentation

## 2018-01-30 DIAGNOSIS — I5032 Chronic diastolic (congestive) heart failure: Secondary | ICD-10-CM | POA: Diagnosis not present

## 2018-01-30 DIAGNOSIS — E119 Type 2 diabetes mellitus without complications: Secondary | ICD-10-CM | POA: Diagnosis not present

## 2018-01-30 DIAGNOSIS — R05 Cough: Secondary | ICD-10-CM | POA: Diagnosis not present

## 2018-01-30 DIAGNOSIS — R509 Fever, unspecified: Secondary | ICD-10-CM | POA: Diagnosis not present

## 2018-01-30 LAB — CBC WITH DIFFERENTIAL/PLATELET
BASOS ABS: 0 10*3/uL (ref 0.0–0.1)
Basophils Relative: 0 %
Eosinophils Absolute: 0 10*3/uL (ref 0.0–0.7)
Eosinophils Relative: 0 %
HEMATOCRIT: 37.1 % (ref 36.0–46.0)
HEMOGLOBIN: 11.6 g/dL — AB (ref 12.0–15.0)
Lymphocytes Relative: 24 %
Lymphs Abs: 2 10*3/uL (ref 0.7–4.0)
MCH: 24.1 pg — ABNORMAL LOW (ref 26.0–34.0)
MCHC: 31.3 g/dL (ref 30.0–36.0)
MCV: 77.1 fL — AB (ref 78.0–100.0)
MONO ABS: 0.8 10*3/uL (ref 0.1–1.0)
Monocytes Relative: 9 %
NEUTROS ABS: 5.8 10*3/uL (ref 1.7–7.7)
NEUTROS PCT: 67 %
Platelets: 228 10*3/uL (ref 150–400)
RBC: 4.81 MIL/uL (ref 3.87–5.11)
RDW: 13.9 % (ref 11.5–15.5)
WBC: 8.6 10*3/uL (ref 4.0–10.5)

## 2018-01-30 LAB — URINALYSIS, ROUTINE W REFLEX MICROSCOPIC
Bacteria, UA: NONE SEEN
Bilirubin Urine: NEGATIVE
GLUCOSE, UA: NEGATIVE mg/dL
HGB URINE DIPSTICK: NEGATIVE
Ketones, ur: NEGATIVE mg/dL
Leukocytes, UA: NEGATIVE
Nitrite: NEGATIVE
PROTEIN: 100 mg/dL — AB
SPECIFIC GRAVITY, URINE: 1.012 (ref 1.005–1.030)
SQUAMOUS EPITHELIAL / LPF: NONE SEEN
pH: 6 (ref 5.0–8.0)

## 2018-01-30 LAB — COMPREHENSIVE METABOLIC PANEL
ALBUMIN: 4.3 g/dL (ref 3.5–5.0)
ALK PHOS: 101 U/L (ref 38–126)
ALT: 27 U/L (ref 14–54)
AST: 31 U/L (ref 15–41)
Anion gap: 14 (ref 5–15)
BILIRUBIN TOTAL: 0.5 mg/dL (ref 0.3–1.2)
BUN: 20 mg/dL (ref 6–20)
CALCIUM: 9.6 mg/dL (ref 8.9–10.3)
CO2: 29 mmol/L (ref 22–32)
CREATININE: 1.66 mg/dL — AB (ref 0.44–1.00)
Chloride: 98 mmol/L — ABNORMAL LOW (ref 101–111)
GFR calc Af Amer: 33 mL/min — ABNORMAL LOW (ref >60)
GFR, EST NON AFRICAN AMERICAN: 28 mL/min — AB (ref >60)
GLUCOSE: 213 mg/dL — AB (ref 65–99)
Potassium: 3.5 mmol/L (ref 3.5–5.1)
Sodium: 141 mmol/L (ref 135–145)
TOTAL PROTEIN: 8.8 g/dL — AB (ref 6.5–8.1)

## 2018-01-30 LAB — INFLUENZA PANEL BY PCR (TYPE A & B)
Influenza A By PCR: POSITIVE — AB
Influenza B By PCR: NEGATIVE

## 2018-01-30 LAB — I-STAT CG4 LACTIC ACID, ED
LACTIC ACID, VENOUS: 1.38 mmol/L (ref 0.5–1.9)
Lactic Acid, Venous: 2.32 mmol/L (ref 0.5–1.9)

## 2018-01-30 MED ORDER — ACETAMINOPHEN 325 MG PO TABS
650.0000 mg | ORAL_TABLET | Freq: Once | ORAL | Status: AC
  Administered 2018-01-30: 650 mg via ORAL
  Filled 2018-01-30: qty 2

## 2018-01-30 MED ORDER — SODIUM CHLORIDE 0.9 % IV BOLUS (SEPSIS)
500.0000 mL | Freq: Once | INTRAVENOUS | Status: AC
  Administered 2018-01-30: 500 mL via INTRAVENOUS

## 2018-01-30 MED ORDER — OSELTAMIVIR PHOSPHATE 75 MG PO CAPS
75.0000 mg | ORAL_CAPSULE | Freq: Once | ORAL | Status: AC
Start: 2018-01-30 — End: 2018-01-30
  Administered 2018-01-30: 75 mg via ORAL
  Filled 2018-01-30: qty 1

## 2018-01-30 MED ORDER — OSELTAMIVIR PHOSPHATE 75 MG PO CAPS: 75.0000 mg | ORAL_CAPSULE | Freq: Two times a day (BID) | ORAL | 0 refills | Status: DC

## 2018-01-30 NOTE — ED Provider Notes (Signed)
[facility] EMERGENCY DEPARTMENT Provider Note   CSN: [id number] Arrival date & time: 01/30/18  3382     History   Chief Complaint Chief Complaint  Patient presents with  . Fever    HPI Stephanie Hendricks is a 77 y.o. female.  Patient brought to the ER because she has not felt well since yesterday morning.  She started with some congestion and slight cough.  Over the course of today the cough has become worse, occasionally has brought up thick phlegm.  She has been more weak than usual.  The patient reports that she feels well currently.  She has not experiencing any pain.  She does not feel short of breath.      Past Medical History:  Diagnosis Date  . Aortic stenosis    Minimal  . Bradycardia   . CAD (coronary artery disease)    Severe LM, LAD, and OM1 disease status post emergent CABG February 2018  . Chronic kidney disease, stage III (moderate) (HCC)   . Degenerative joint disease   . Diabetes mellitus type II   . Essential hypertension, benign   . Hyperlipidemia   . Lower extremity edema   . Microcytic anemia   . NSTEMI (non-ST elevated myocardial infarction) ([place]) 01/2017  . Palpitations   . Postoperative atrial fibrillation [facility])     Patient Active Problem List   Diagnosis Date Noted  . Paroxysmal atrial fibrillation ([place]) post op CABG 03/14/2017  . Chronic diastolic CHF (congestive heart failure) ([place])   . S/P CABG x 3   . AKI (acute kidney injury) ([place])   . Benign essential HTN   . Diastolic dysfunction   . Acute blood loss anemia   . Leukocytosis   . Tachypnea   . NSTEMI (non-ST elevated myocardial infarction) ([place]) 02/16/2017  . Coronary artery disease 02/16/2017  . Acute gastroenteritis 11/07/2014  . Intractable nausea and vomiting 11/06/2014  . Obesity 11/06/2014  . Chronic kidney disease (CKD), stage III (moderate) ([place]) 11/06/2014  . Microcytic anemia 11/06/2014  . Aortic valve disorder 10/22/2012  . Bradycardia   . Essential hypertension     . Hyperlipidemia   . Type II diabetes mellitus with nephropathy ([place])   . Hx of colonic polyps 08/21/2011    Past Surgical History:  Procedure Laterality Date  . COLONOSCOPY  08/28/2011   sigmoid colon serated adenoma, next colonoscopy 08/2016  . COLONOSCOPY W/ POLYPECTOMY  2005   Dr. Tamela Oddi resected per patient  . CORONARY ARTERY BYPASS GRAFT N/A 02/16/2017   Procedure: CORONARY ARTERY BYPASS GRAFTING (CABG) x 3 (LIMA to LAD, SVG SEQUENTIALLY to RAMUS INTERMEDIATE and DISTAL CIRCUMFLEX) WITH ENDOSCOPIC HARVESTING OF RIGHT SAPHENOUS VEIN;  Surgeon: Grace Isaac, MD;  Location: [place];  Service: Open Heart Surgery;  Laterality: N/A;  . ESOPHAGOGASTRODUODENOSCOPY     remote, foreign body extraction  . ESOPHAGOGASTRODUODENOSCOPY  08/28/2011   hiatal hernia, erosion on fold of diaphragmatic hiatus (cameron lesion), antral and prepylori erosions, SB bx negative, gastric bx showed reactive gastropathy but no H.Pylori  . HEMORROIDECTOMY    . LEFT HEART CATH AND CORONARY ANGIOGRAPHY N/A 02/16/2017   Procedure: Left Heart Cath and Coronary Angiography;  Surgeon: Burnell Blanks, MD;  Location: [place] CV LAB;  Service: Cardiovascular;  Laterality: N/A;  . TONSILLECTOMY    . TUBAL LIGATION      OB History    No data available       Home Medications    Prior to Admission medications  Medication Sig Start Date End Date Taking? Authorizing Provider  acetaminophen (TYLENOL) 325 MG tablet Take 2 tablets (650 mg total) by mouth every 6 (six) hours as needed for mild pain. 03/02/17   Barrett, Erin R, PA-C  amLODipine (NORVASC) 10 MG tablet Take 1 tablet (10 mg total) by mouth daily. 03/02/17   Barrett, Erin R, PA-C  aspirin 81 MG tablet Take 81 mg by mouth daily.      [provider]  cloNIDine (CATAPRES) 0.2 MG tablet TAKE (1) TABLET BY MOUTH TWICE DAILY. 01/17/18   Satira Sark, MD  furosemide (LASIX) 20 MG tablet Take 60 mg by mouth 2 (two) times daily.      [provider]  glipiZIDE (GLIPIZIDE XL) 5 MG 24 hr tablet Take 1 tablet (5 mg total) by mouth daily with breakfast. Do not take this medication if your blood sugar is less than 110. 11/07/14   Rexene Alberts, MD  oseltamivir (TAMIFLU) 75 MG capsule Take 1 capsule (75 mg total) by mouth every 12 (twelve) hours. 01/30/18   Ilse Billman, Gwenyth Allegra, MD  potassium chloride SA (K-DUR,KLOR-CON) 20 MEQ tablet Take 2 tablets (40 mEq total) by mouth daily. 03/02/17   Barrett, Erin R, PA-C  simvastatin (ZOCOR) 20 MG tablet TAKE (1) TABLET BY MOUTH ONCE DAILY AT BEDTIME FOR CHOLESTEROL. 01/08/17   Satira Sark, MD  TRADJENTA 5 MG TABS tablet Take 5 mg by mouth daily.  12/26/17   [provider]    Family History Family History  Problem Relation Age of Onset  . Hypertension Father   . Coronary artery disease Sister   . Heart attack Brother        Deceased  . Colon cancer Neg Hx   . Liver disease Neg Hx     Social History Social History   Tobacco Use  . Smoking status: Never Smoker  . Smokeless tobacco: Never Used  Substance Use Topics  . Alcohol use: No    Alcohol/week: 0.0 oz  . Drug use: No     Allergies   Patient has no known allergies.   Review of Systems Review of Systems  Constitutional: Positive for fatigue.  Respiratory: Positive for cough.   All other systems reviewed and are negative.    Physical Exam Updated Vital Signs BP 135/70   Pulse 75   Temp (!) 101 F (38.3 C) (Oral)   Resp 20   SpO2 92%   Physical Exam  Constitutional: She is oriented to person, place, and time. She appears well-developed and well-nourished. No distress.  HENT:  Head: Normocephalic and atraumatic.  Right Ear: Hearing normal.  Left Ear: Hearing normal.  Nose: Nose normal.  Mouth/Throat: Oropharynx is clear and moist and mucous membranes are normal.  Eyes: Conjunctivae and EOM are normal. Pupils are equal, round, and reactive to light.  Neck: Normal range of motion.  Neck supple.  Cardiovascular: Regular rhythm, S1 normal and S2 normal. Exam reveals no gallop and no friction rub.  No murmur heard. Pulmonary/Chest: Effort normal and breath sounds normal. No respiratory distress. She exhibits no tenderness.  Abdominal: Soft. Normal appearance and bowel sounds are normal. There is no hepatosplenomegaly. There is no tenderness. There is no rebound, no guarding, no tenderness at McBurney's point and negative Murphy's sign. No hernia.  Musculoskeletal: Normal range of motion.  Neurological: She is alert and oriented to person, place, and time. She has normal strength. No cranial nerve deficit or sensory deficit. Coordination normal. GCS eye  subscore is 4. GCS verbal subscore is 5. GCS motor subscore is 6.  Skin: Skin is warm, dry and intact. No rash noted. No cyanosis.  Psychiatric: She has a normal mood and affect. Her speech is normal and behavior is normal. Thought content normal.  Nursing note and vitals reviewed.    ED Treatments / Results  Labs (all labs ordered are listed, but only abnormal results are displayed) Labs Reviewed  CBC WITH DIFFERENTIAL/PLATELET - Abnormal; Notable for the following components:      Result Value   Hemoglobin 11.6 (*)    MCV 77.1 (*)    MCH 24.1 (*)    All other components within normal limits  COMPREHENSIVE METABOLIC PANEL - Abnormal; Notable for the following components:   Chloride 98 (*)    Glucose, Bld 213 (*)    Creatinine, Ser 1.66 (*)    Total Protein 8.8 (*)    GFR calc non Af Amer 28 (*)    GFR calc Af Amer 33 (*)    All other components within normal limits  URINALYSIS, ROUTINE W REFLEX MICROSCOPIC - Abnormal; Notable for the following components:   Protein, ur 100 (*)    All other components within normal limits  INFLUENZA PANEL BY PCR (TYPE A & B) - Abnormal; Notable for the following components:   Influenza A By PCR POSITIVE (*)    All other components within normal limits  I-STAT CG4 LACTIC ACID, ED  - Abnormal; Notable for the following components:   Lactic Acid, Venous 2.32 (*)    All other components within normal limits  I-STAT CG4 LACTIC ACID, ED    EKG  EKG Interpretation None       Radiology Dg Chest 2 View  Result Date: 01/30/2018 CLINICAL DATA:  Cough EXAM: CHEST  2 VIEW COMPARISON:  04/08/2017 FINDINGS: Post sternotomy changes. Elevation of the left diaphragm with air filled bowel in the left upper quadrant. Subsegmental atelectasis at the bilateral lung bases. Mild cardiomegaly with aortic atherosclerosis. No pneumothorax. IMPRESSION: 1. Stable elevation of the left diaphragm with atelectasis at the bases 2. Mild cardiomegaly Electronically Signed   By: Donavan Foil M.D.   On: 01/30/2018 03:45    Procedures Procedures (including critical care time)  Medications Ordered in ED Medications  acetaminophen (TYLENOL) tablet 650 mg (650 mg Oral Given 01/30/18 0338)  sodium chloride 0.9 % bolus 500 mL (0 mLs Intravenous Stopped 01/30/18 0517)  oseltamivir (TAMIFLU) capsule 75 mg (75 mg Oral Given 01/30/18 0530)     Initial Impression / Assessment and Plan / ED Course  I have reviewed the triage vital signs and the nursing notes.  Pertinent labs & imaging results that were available during my care of the patient were reviewed by me and considered in my medical decision making (see chart for details).     Patient brought to the ER by family for evaluation yesterday and worsened overnight.  Patient denies any distress at arrival.  Oxygen saturation is.  She did have a fever at arrival.  [facility] count is not elevated.  Lactic acid is slightly elevated.  She is positive for influenza A which explains her fever and symptoms.  She was started on Tamiflu.  She has done well here in the ER.  Vital signs are stable and she feels well.  I do not believe she requires hospitalization at this time.  Patient was counseled to follow-up with primary care physician this week.  She and  her  family were also counseled that she should return to the ER immediately if she has uncontrolled vomiting or any worsening with her breathing.  Final Clinical Impressions(s) / ED Diagnoses   Final diagnoses:  Influenza    ED Discharge Orders        Ordered    oseltamivir (TAMIFLU) 75 MG capsule  Every 12 hours     01/30/18 0607       Orpah Greek, MD 01/30/18 [phone]

## 2018-01-30 NOTE — ED Triage Notes (Signed)
Per family members, pt has just not been feeling well, generalized weakness, vomited x 1.  Pt states she has had to urinate frequently.

## 2018-02-06 NOTE — ED Notes (Signed)
CRITICAL VALUE ALERT  Critical Value:  Lactic Acid 2.23  Date & Time Notied:   01/30/18 at [place]  Provider Notified: Dr. Betsey Holiday, EDP  Orders Received/Actions taken: No orders at this time

## 2018-02-26 DIAGNOSIS — I060 Rheumatic aortic stenosis: Secondary | ICD-10-CM | POA: Diagnosis not present

## 2018-02-26 DIAGNOSIS — E1122 Type 2 diabetes mellitus with diabetic chronic kidney disease: Secondary | ICD-10-CM | POA: Diagnosis not present

## 2018-03-19 DIAGNOSIS — H3321 Serous retinal detachment, right eye: Secondary | ICD-10-CM | POA: Diagnosis not present

## 2018-04-17 ENCOUNTER — Other Ambulatory Visit: Payer: Self-pay

## 2018-04-17 DIAGNOSIS — I350 Nonrheumatic aortic (valve) stenosis: Secondary | ICD-10-CM

## 2018-04-17 NOTE — Progress Notes (Signed)
Order entered for echo.

## 2018-05-28 DIAGNOSIS — E1122 Type 2 diabetes mellitus with diabetic chronic kidney disease: Secondary | ICD-10-CM | POA: Diagnosis not present

## 2018-05-28 DIAGNOSIS — E782 Mixed hyperlipidemia: Secondary | ICD-10-CM | POA: Diagnosis not present

## 2018-06-02 DIAGNOSIS — E782 Mixed hyperlipidemia: Secondary | ICD-10-CM | POA: Diagnosis not present

## 2018-06-02 DIAGNOSIS — E876 Hypokalemia: Secondary | ICD-10-CM | POA: Diagnosis not present

## 2018-06-02 DIAGNOSIS — N183 Chronic kidney disease, stage 3 (moderate): Secondary | ICD-10-CM | POA: Diagnosis not present

## 2018-06-02 DIAGNOSIS — I10 Essential (primary) hypertension: Secondary | ICD-10-CM | POA: Diagnosis not present

## 2018-06-02 DIAGNOSIS — E1122 Type 2 diabetes mellitus with diabetic chronic kidney disease: Secondary | ICD-10-CM | POA: Diagnosis not present

## 2018-06-23 DIAGNOSIS — E1122 Type 2 diabetes mellitus with diabetic chronic kidney disease: Secondary | ICD-10-CM | POA: Diagnosis not present

## 2018-06-23 DIAGNOSIS — I10 Essential (primary) hypertension: Secondary | ICD-10-CM | POA: Diagnosis not present

## 2018-07-15 NOTE — Progress Notes (Signed)
Cardiology Office Note  Date: 07/16/2018   ID: Stephanie Hendricks, DOB [birth year], MRN [id number]  PCP: Celene Squibb, MD  Primary Cardiologist: Rozann Lesches, MD   Chief Complaint  Patient presents with  . Coronary Artery Disease  . Aortic Stenosis    History of Present Illness: Stephanie Hendricks is a 77 y.o. female last seen in January.  She is here with her daughter for a follow-up visit.  She does not report any angina symptoms or palpitations.  States she is working on better glucose control with Dr. Nevada Crane, her insulin dose has been increased.  We are requesting her most recent lab work.  She presented today for a follow-up echocardiogram, results are pending at this time.  Current cardiac regimen includes aspirin, Norvasc, Lipitor, clonidine, Lasix, and potassium supplements.  Past Medical History:  Diagnosis Date  . Aortic stenosis    Minimal  . Bradycardia   . CAD (coronary artery disease)    Severe LM, LAD, and OM1 disease status post emergent CABG February 2018  . Chronic kidney disease, stage III (moderate) (HCC)   . Degenerative joint disease   . Diabetes mellitus type II   . Essential hypertension, benign   . Hyperlipidemia   . Lower extremity edema   . Microcytic anemia   . NSTEMI (non-ST elevated myocardial infarction) ([place]) 01/2017  . Palpitations   . Postoperative atrial fibrillation [facility])     Past Surgical History:  Procedure Laterality Date  . COLONOSCOPY  08/28/2011   sigmoid colon serated adenoma, next colonoscopy 08/2016  . COLONOSCOPY W/ POLYPECTOMY  2005   Dr. Tamela Oddi resected per patient  . CORONARY ARTERY BYPASS GRAFT N/A 02/16/2017   Procedure: CORONARY ARTERY BYPASS GRAFTING (CABG) x 3 (LIMA to LAD, SVG SEQUENTIALLY to RAMUS INTERMEDIATE and DISTAL CIRCUMFLEX) WITH ENDOSCOPIC HARVESTING OF RIGHT SAPHENOUS VEIN;  Surgeon: Grace Isaac, MD;  Location: [place];  Service: Open Heart Surgery;  Laterality: N/A;  . ESOPHAGOGASTRODUODENOSCOPY       remote, foreign body extraction  . ESOPHAGOGASTRODUODENOSCOPY  08/28/2011   hiatal hernia, erosion on fold of diaphragmatic hiatus (cameron lesion), antral and prepylori erosions, SB bx negative, gastric bx showed reactive gastropathy but no H.Pylori  . HEMORROIDECTOMY    . LEFT HEART CATH AND CORONARY ANGIOGRAPHY N/A 02/16/2017   Procedure: Left Heart Cath and Coronary Angiography;  Surgeon: Burnell Blanks, MD;  Location: [place] CV LAB;  Service: Cardiovascular;  Laterality: N/A;  . TONSILLECTOMY    . TUBAL LIGATION      Current Outpatient Medications  Medication Sig Dispense Refill  . acetaminophen (TYLENOL) 325 MG tablet Take 2 tablets (650 mg total) by mouth every 6 (six) hours as needed for mild pain.    Marland Kitchen amLODipine (NORVASC) 10 MG tablet Take 1 tablet (10 mg total) by mouth daily.    Marland Kitchen aspirin 81 MG tablet Take 81 mg by mouth daily.      Marland Kitchen atorvastatin (LIPITOR) 20 MG tablet Take 1 tablet by mouth daily.    . cloNIDine (CATAPRES) 0.2 MG tablet TAKE (1) TABLET BY MOUTH TWICE DAILY. 180 tablet 1  . furosemide (LASIX) 40 MG tablet Take 1 tablet by mouth 2 (two) times daily.    . potassium chloride SA (K-DUR,KLOR-CON) 20 MEQ tablet Take 2 tablets (40 mEq total) by mouth daily.    Willeen Niece 100-33 UNT-MCG/ML SOPN Inject 30 Units into the skin daily.      No current facility-administered medications for this  visit.    Allergies:  Patient has no known allergies.   Social History: The patient  reports that she has never smoked. She has never used smokeless tobacco. She reports that she does not drink alcohol or use drugs.   ROS:  Please see the history of present illness. Otherwise, complete review of systems is positive for hearing loss.  All other systems are reviewed and negative.   Physical Exam: VS:  BP 138/60   Pulse (!) 56   Ht 5\' 1"  (1.549 m)   Wt 181 lb 12.8 oz (82.5 kg)   SpO2 96% Comment: on room air  BMI 34.35 kg/m , BMI Body mass index is 34.35  kg/m.  Wt Readings from Last 3 Encounters:  07/16/18 181 lb 12.8 oz (82.5 kg)  01/07/18 172 lb 6.4 oz (78.2 kg)  10/17/17 168 lb (76.2 kg)    General: Elderly woman, appears comfortable at rest. HEENT: Conjunctiva and lids normal, oropharynx clear. Neck: Supple, no elevated JVP or carotid bruits, no thyromegaly. Lungs: Clear to auscultation, nonlabored breathing at rest. Cardiac: Regular rate and rhythm, no S3, 2/6 systolic murmur. Abdomen: Soft, nontender, bowel sounds present. Extremities: No pitting edema, distal pulses 2+. Skin: Warm and dry. Musculoskeletal: No kyphosis. Neuropsychiatric: Alert and oriented x3, affect grossly appropriate.  ECG: I personally reviewed the tracing from 01/30/2018 which showed sinus rhythm with nonspecific T wave changes and prolonged QT interval.  Recent Labwork: 01/30/2018: ALT 27; AST 31; BUN 20; Creatinine, Ser 1.66; Hemoglobin 11.6; Platelets 228; Potassium 3.5; Sodium 141     Component Value Date/Time   CHOL 70 02/16/2017 1300   TRIG 62 02/16/2017 1300   HDL 22 (L) 02/16/2017 1300   CHOLHDL 3.2 02/16/2017 1300   VLDL 12 02/16/2017 1300   LDLCALC 36 02/16/2017 1300    Other Studies Reviewed Today:  Echocardiogram 02/21/2017: Study Conclusions  - Left ventricle: The cavity size was normal. There was moderate concentric hypertrophy. Systolic function was vigorous. The estimated ejection fraction was in the range of 65% to 70%. Wall motion was normal; there were no regional wall motion abnormalities. Doppler parameters are consistent with abnormal left ventricular relaxation (grade 1 diastolic dysfunction). - Aortic valve: There was mild stenosis. Peak velocity (S): 249 cm/s. Mean gradient (S): 12 mm Hg. Valve area (VTI): 1.65 cm^2. Valve area (Vmax): 1.56 cm^2. Valve area (Vmean): 1.64 cm^2. - Left atrium: The atrium was mildly dilated. Volume/bsa, S: 35 ml/m^2. - Tricuspid valve: There was moderate regurgitation. -  Pulmonary arteries: Systolic pressure was mildly increased.  Assessment and Plan:  1.  CAD status post CABG in February 2018.  She reports no angina symptoms and plan is to continue with medical therapy and observation at this point.  2.  Mild aortic stenosis, follow-up echocardiogram pending.  3.  Mixed hyperlipidemia on Zocor.  Requesting recent lab work from Dr. Nevada Crane.  4.  Essential hypertension, no changes made to antihypertensive regimen.  Current medicines were reviewed with the patient today.  Disposition: Follow-up in 6 months.  Signed, Satira Sark, MD, [facility] 07/16/2018 2:31 PM    [place] [facility] [address] [place], [place] [address] Phone: [phone]; Fax: [phone]

## 2018-07-16 ENCOUNTER — Encounter: Payer: Self-pay | Admitting: Cardiology

## 2018-07-16 ENCOUNTER — Ambulatory Visit: Payer: Medicare Other | Admitting: Cardiology

## 2018-07-16 ENCOUNTER — Encounter: Payer: Self-pay | Admitting: *Deleted

## 2018-07-16 ENCOUNTER — Ambulatory Visit (HOSPITAL_COMMUNITY)
Admission: RE | Admit: 2018-07-16 | Discharge: 2018-07-16 | Disposition: A | Discharge status: Home / Self Care | Payer: Medicare Other | Source: Ambulatory Visit | Attending: Hematology | Admitting: Hematology

## 2018-07-16 VITALS — BP 138/60 | HR 56 | Ht 61.0 in | Wt 181.8 lb

## 2018-07-16 DIAGNOSIS — E119 Type 2 diabetes mellitus without complications: Secondary | ICD-10-CM | POA: Insufficient documentation

## 2018-07-16 DIAGNOSIS — I480 Paroxysmal atrial fibrillation: Secondary | ICD-10-CM | POA: Diagnosis not present

## 2018-07-16 DIAGNOSIS — E785 Hyperlipidemia, unspecified: Secondary | ICD-10-CM | POA: Insufficient documentation

## 2018-07-16 DIAGNOSIS — Z951 Presence of aortocoronary bypass graft: Secondary | ICD-10-CM | POA: Diagnosis not present

## 2018-07-16 DIAGNOSIS — I10 Essential (primary) hypertension: Secondary | ICD-10-CM | POA: Diagnosis not present

## 2018-07-16 DIAGNOSIS — I350 Nonrheumatic aortic (valve) stenosis: Secondary | ICD-10-CM | POA: Insufficient documentation

## 2018-07-16 DIAGNOSIS — E669 Obesity, unspecified: Secondary | ICD-10-CM | POA: Diagnosis not present

## 2018-07-16 DIAGNOSIS — I25119 Atherosclerotic heart disease of native coronary artery with unspecified angina pectoris: Secondary | ICD-10-CM | POA: Diagnosis not present

## 2018-07-16 DIAGNOSIS — E782 Mixed hyperlipidemia: Secondary | ICD-10-CM | POA: Diagnosis not present

## 2018-07-16 DIAGNOSIS — I252 Old myocardial infarction: Secondary | ICD-10-CM | POA: Insufficient documentation

## 2018-07-16 NOTE — Progress Notes (Signed)
*  PRELIMINARY RESULTS* Echocardiogram 2D Echocardiogram has been performed.  Stephanie Hendricks 07/16/2018, 1:49 PM

## 2018-07-16 NOTE — Patient Instructions (Addendum)

## 2018-07-28 DIAGNOSIS — E1122 Type 2 diabetes mellitus with diabetic chronic kidney disease: Secondary | ICD-10-CM | POA: Diagnosis not present

## 2018-07-28 DIAGNOSIS — N183 Chronic kidney disease, stage 3 (moderate): Secondary | ICD-10-CM | POA: Diagnosis not present

## 2018-08-13 DIAGNOSIS — H3321 Serous retinal detachment, right eye: Secondary | ICD-10-CM | POA: Diagnosis not present

## 2018-08-13 DIAGNOSIS — H3530 Unspecified macular degeneration: Secondary | ICD-10-CM | POA: Diagnosis not present

## 2018-10-14 DIAGNOSIS — E785 Hyperlipidemia, unspecified: Secondary | ICD-10-CM | POA: Diagnosis not present

## 2018-10-14 DIAGNOSIS — E1122 Type 2 diabetes mellitus with diabetic chronic kidney disease: Secondary | ICD-10-CM | POA: Diagnosis not present

## 2018-10-14 DIAGNOSIS — E782 Mixed hyperlipidemia: Secondary | ICD-10-CM | POA: Diagnosis not present

## 2018-10-14 DIAGNOSIS — I10 Essential (primary) hypertension: Secondary | ICD-10-CM | POA: Diagnosis not present

## 2018-10-14 DIAGNOSIS — D509 Iron deficiency anemia, unspecified: Secondary | ICD-10-CM | POA: Diagnosis not present

## 2018-10-15 ENCOUNTER — Other Ambulatory Visit: Payer: Self-pay | Admitting: Cardiology

## 2018-10-21 DIAGNOSIS — E1122 Type 2 diabetes mellitus with diabetic chronic kidney disease: Secondary | ICD-10-CM | POA: Diagnosis not present

## 2018-10-21 DIAGNOSIS — D509 Iron deficiency anemia, unspecified: Secondary | ICD-10-CM | POA: Diagnosis not present

## 2018-10-21 DIAGNOSIS — E876 Hypokalemia: Secondary | ICD-10-CM | POA: Diagnosis not present

## 2018-10-21 DIAGNOSIS — I10 Essential (primary) hypertension: Secondary | ICD-10-CM | POA: Diagnosis not present

## 2018-10-21 DIAGNOSIS — N183 Chronic kidney disease, stage 3 (moderate): Secondary | ICD-10-CM | POA: Diagnosis not present

## 2018-10-21 DIAGNOSIS — E782 Mixed hyperlipidemia: Secondary | ICD-10-CM | POA: Diagnosis not present

## 2018-10-21 DIAGNOSIS — Z0000 Encounter for general adult medical examination without abnormal findings: Secondary | ICD-10-CM | POA: Diagnosis not present

## 2018-10-24 IMAGING — CR DG CHEST 1V PORT
1 series · 1 of 1 positions shown · non-contrast
Comparison: 10/20/2007

CLINICAL DATA: Pain in the upper chest

EXAM:
PORTABLE CHEST 1 VIEW

[portable]
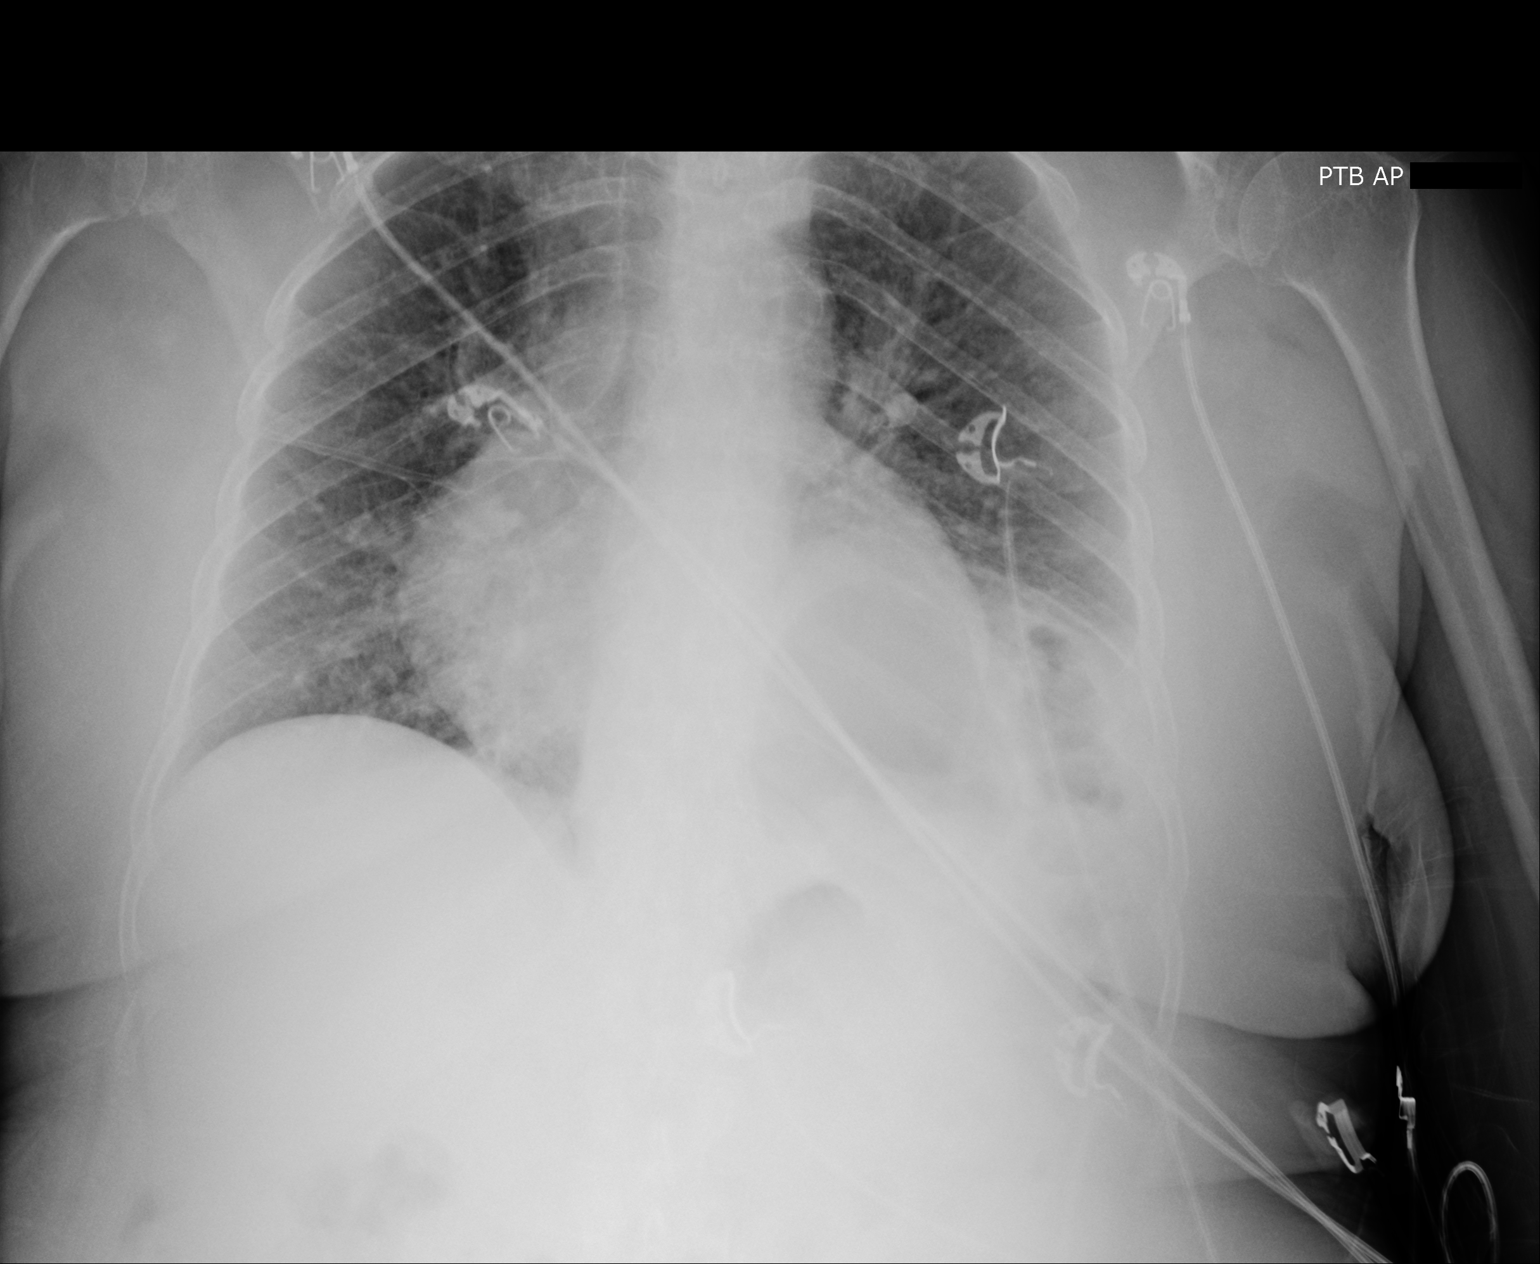

[1 of 1 positions shown; findings below may reference images not displayed]

FINDINGS: Elevation of the left diaphragm. Cannot exclude left basilar
atelectasis or infiltrate. Possible tiny left effusion.

Mild cardiomegaly without overt failure. Right lung grossly clear.
No pneumothorax.
IMPRESSION: 1. Elevated left diaphragm. Cannot exclude mild left basilar
atelectasis or infiltrate. Possible tiny left effusion
2. Mild cardiomegaly.

## 2018-11-07 DIAGNOSIS — H3530 Unspecified macular degeneration: Secondary | ICD-10-CM | POA: Diagnosis not present

## 2018-11-07 DIAGNOSIS — H31421 Serous choroidal detachment, right eye: Secondary | ICD-10-CM | POA: Diagnosis not present

## 2018-11-07 DIAGNOSIS — H3321 Serous retinal detachment, right eye: Secondary | ICD-10-CM | POA: Diagnosis not present

## 2018-11-07 DIAGNOSIS — H31012 Macula scars of posterior pole (postinflammatory) (post-traumatic), left eye: Secondary | ICD-10-CM | POA: Diagnosis not present

## 2018-11-07 DIAGNOSIS — H2513 Age-related nuclear cataract, bilateral: Secondary | ICD-10-CM | POA: Diagnosis not present

## 2018-12-10 ENCOUNTER — Emergency Department (HOSPITAL_COMMUNITY): Payer: Medicare Other

## 2018-12-10 ENCOUNTER — Emergency Department (HOSPITAL_COMMUNITY)
Admission: EM | Admit: 2018-12-10 | Discharge: 2018-12-10 | Disposition: A | Discharge status: Home / Self Care | Payer: Medicare Other | Attending: Emergency Medicine | Admitting: Emergency Medicine

## 2018-12-10 ENCOUNTER — Other Ambulatory Visit: Payer: Self-pay

## 2018-12-10 ENCOUNTER — Encounter (HOSPITAL_COMMUNITY): Payer: Self-pay | Admitting: Emergency Medicine

## 2018-12-10 DIAGNOSIS — I130 Hypertensive heart and chronic kidney disease with heart failure and stage 1 through stage 4 chronic kidney disease, or unspecified chronic kidney disease: Secondary | ICD-10-CM | POA: Diagnosis not present

## 2018-12-10 DIAGNOSIS — I2510 Atherosclerotic heart disease of native coronary artery without angina pectoris: Secondary | ICD-10-CM | POA: Diagnosis not present

## 2018-12-10 DIAGNOSIS — R109 Unspecified abdominal pain: Secondary | ICD-10-CM

## 2018-12-10 DIAGNOSIS — R1084 Generalized abdominal pain: Secondary | ICD-10-CM | POA: Diagnosis not present

## 2018-12-10 DIAGNOSIS — N183 Chronic kidney disease, stage 3 (moderate): Secondary | ICD-10-CM | POA: Insufficient documentation

## 2018-12-10 DIAGNOSIS — E119 Type 2 diabetes mellitus without complications: Secondary | ICD-10-CM | POA: Insufficient documentation

## 2018-12-10 DIAGNOSIS — I5032 Chronic diastolic (congestive) heart failure: Secondary | ICD-10-CM | POA: Diagnosis not present

## 2018-12-10 DIAGNOSIS — Z794 Long term (current) use of insulin: Secondary | ICD-10-CM | POA: Insufficient documentation

## 2018-12-10 DIAGNOSIS — Z79899 Other long term (current) drug therapy: Secondary | ICD-10-CM | POA: Diagnosis not present

## 2018-12-10 DIAGNOSIS — Z7982 Long term (current) use of aspirin: Secondary | ICD-10-CM | POA: Diagnosis not present

## 2018-12-10 DIAGNOSIS — M545 Low back pain: Secondary | ICD-10-CM | POA: Diagnosis not present

## 2018-12-10 DIAGNOSIS — N200 Calculus of kidney: Secondary | ICD-10-CM | POA: Diagnosis not present

## 2018-12-10 LAB — CBC WITH DIFFERENTIAL/PLATELET
Abs Immature Granulocytes: 0.02 10*3/uL (ref 0.00–0.07)
BASOS ABS: 0.1 10*3/uL (ref 0.0–0.1)
Basophils Relative: 1 %
EOS ABS: 0.2 10*3/uL (ref 0.0–0.5)
EOS PCT: 2 %
HCT: 39.3 % (ref 36.0–46.0)
HEMOGLOBIN: 11.5 g/dL — AB (ref 12.0–15.0)
Immature Granulocytes: 0 %
LYMPHS PCT: 40 %
Lymphs Abs: 3.6 10*3/uL (ref 0.7–4.0)
MCH: 23.5 pg — ABNORMAL LOW (ref 26.0–34.0)
MCHC: 29.3 g/dL — AB (ref 30.0–36.0)
MCV: 80.2 fL (ref 80.0–100.0)
Monocytes Absolute: 0.7 10*3/uL (ref 0.1–1.0)
Monocytes Relative: 7 %
NRBC: 0 % (ref 0.0–0.2)
Neutro Abs: 4.6 10*3/uL (ref 1.7–7.7)
Neutrophils Relative %: 50 %
PLATELETS: 283 10*3/uL (ref 150–400)
RBC: 4.9 MIL/uL (ref 3.87–5.11)
RDW: 13.9 % (ref 11.5–15.5)
WBC: 9.1 10*3/uL (ref 4.0–10.5)

## 2018-12-10 LAB — URINALYSIS, ROUTINE W REFLEX MICROSCOPIC
BILIRUBIN URINE: NEGATIVE
Glucose, UA: NEGATIVE mg/dL
Hgb urine dipstick: NEGATIVE
Ketones, ur: NEGATIVE mg/dL
Leukocytes, UA: NEGATIVE
NITRITE: NEGATIVE
Protein, ur: NEGATIVE mg/dL
SPECIFIC GRAVITY, URINE: 1.009 (ref 1.005–1.030)
pH: 5 (ref 5.0–8.0)

## 2018-12-10 LAB — COMPREHENSIVE METABOLIC PANEL
ALBUMIN: 3.9 g/dL (ref 3.5–5.0)
ALT: 25 U/L (ref 0–44)
ANION GAP: 9 (ref 5–15)
AST: 21 U/L (ref 15–41)
Alkaline Phosphatase: 94 U/L (ref 38–126)
BUN: 19 mg/dL (ref 8–23)
CHLORIDE: 103 mmol/L (ref 98–111)
CO2: 28 mmol/L (ref 22–32)
Calcium: 9.2 mg/dL (ref 8.9–10.3)
Creatinine, Ser: 1.39 mg/dL — ABNORMAL HIGH (ref 0.44–1.00)
GFR calc non Af Amer: 36 mL/min — ABNORMAL LOW (ref >60)
GFR, EST AFRICAN AMERICAN: 42 mL/min — AB (ref >60)
GLUCOSE: 143 mg/dL — AB (ref 70–99)
POTASSIUM: 3.5 mmol/L (ref 3.5–5.1)
Sodium: 140 mmol/L (ref 135–145)
Total Bilirubin: 0.6 mg/dL (ref 0.3–1.2)
Total Protein: 8.6 g/dL — ABNORMAL HIGH (ref 6.5–8.1)

## 2018-12-10 LAB — LIPASE, BLOOD: Lipase: 34 U/L (ref 11–51)

## 2018-12-10 MED ORDER — METHOCARBAMOL 500 MG PO TABS: 500.0000 mg | ORAL_TABLET | Freq: Three times a day (TID) | ORAL | 0 refills | Status: DC | PRN

## 2018-12-10 NOTE — Discharge Instructions (Signed)
Your CT scan is stable.  Use Tylenol or ibuprofen as needed for pain and follow-up with your doctor.  Return to the ED with new or worsening symptoms.
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
## 2018-12-10 NOTE — ED Triage Notes (Signed)
Pt C/O left sided flank pain. Pt states it "feels like I have a bunch of gas in there." The pain began 4 days ago. Pt states it hurts when she is turning over in the bed.

## 2019-01-15 ENCOUNTER — Other Ambulatory Visit: Payer: Self-pay | Admitting: Cardiology

## 2019-01-16 DIAGNOSIS — H2513 Age-related nuclear cataract, bilateral: Secondary | ICD-10-CM | POA: Diagnosis not present

## 2019-01-16 DIAGNOSIS — H31012 Macula scars of posterior pole (postinflammatory) (post-traumatic), left eye: Secondary | ICD-10-CM | POA: Diagnosis not present

## 2019-01-16 DIAGNOSIS — H3530 Unspecified macular degeneration: Secondary | ICD-10-CM | POA: Diagnosis not present

## 2019-01-16 DIAGNOSIS — H3321 Serous retinal detachment, right eye: Secondary | ICD-10-CM | POA: Diagnosis not present

## 2019-01-16 DIAGNOSIS — H31421 Serous choroidal detachment, right eye: Secondary | ICD-10-CM | POA: Diagnosis not present

## 2019-01-28 DIAGNOSIS — E1122 Type 2 diabetes mellitus with diabetic chronic kidney disease: Secondary | ICD-10-CM | POA: Diagnosis not present

## 2019-01-28 DIAGNOSIS — D509 Iron deficiency anemia, unspecified: Secondary | ICD-10-CM | POA: Diagnosis not present

## 2019-01-28 DIAGNOSIS — E782 Mixed hyperlipidemia: Secondary | ICD-10-CM | POA: Diagnosis not present

## 2019-01-28 DIAGNOSIS — E785 Hyperlipidemia, unspecified: Secondary | ICD-10-CM | POA: Diagnosis not present

## 2019-01-28 DIAGNOSIS — I10 Essential (primary) hypertension: Secondary | ICD-10-CM | POA: Diagnosis not present

## 2019-01-30 DIAGNOSIS — E1122 Type 2 diabetes mellitus with diabetic chronic kidney disease: Secondary | ICD-10-CM | POA: Diagnosis not present

## 2019-01-30 DIAGNOSIS — E876 Hypokalemia: Secondary | ICD-10-CM | POA: Diagnosis not present

## 2019-01-30 DIAGNOSIS — I10 Essential (primary) hypertension: Secondary | ICD-10-CM | POA: Diagnosis not present

## 2019-01-30 DIAGNOSIS — H33009 Unspecified retinal detachment with retinal break, unspecified eye: Secondary | ICD-10-CM | POA: Diagnosis not present

## 2019-01-30 DIAGNOSIS — N183 Chronic kidney disease, stage 3 (moderate): Secondary | ICD-10-CM | POA: Diagnosis not present

## 2019-04-29 DIAGNOSIS — Z0000 Encounter for general adult medical examination without abnormal findings: Secondary | ICD-10-CM | POA: Diagnosis not present

## 2019-05-01 DIAGNOSIS — N183 Chronic kidney disease, stage 3 (moderate): Secondary | ICD-10-CM | POA: Diagnosis not present

## 2019-05-01 DIAGNOSIS — D509 Iron deficiency anemia, unspecified: Secondary | ICD-10-CM | POA: Diagnosis not present

## 2019-05-01 DIAGNOSIS — I10 Essential (primary) hypertension: Secondary | ICD-10-CM | POA: Diagnosis not present

## 2019-05-01 DIAGNOSIS — E782 Mixed hyperlipidemia: Secondary | ICD-10-CM | POA: Diagnosis not present

## 2019-05-01 DIAGNOSIS — E1122 Type 2 diabetes mellitus with diabetic chronic kidney disease: Secondary | ICD-10-CM | POA: Diagnosis not present

## 2019-05-05 DIAGNOSIS — I2510 Atherosclerotic heart disease of native coronary artery without angina pectoris: Secondary | ICD-10-CM | POA: Diagnosis not present

## 2019-05-05 DIAGNOSIS — I10 Essential (primary) hypertension: Secondary | ICD-10-CM | POA: Diagnosis not present

## 2019-05-05 DIAGNOSIS — E782 Mixed hyperlipidemia: Secondary | ICD-10-CM | POA: Diagnosis not present

## 2019-05-05 DIAGNOSIS — N183 Chronic kidney disease, stage 3 (moderate): Secondary | ICD-10-CM | POA: Diagnosis not present

## 2019-05-05 DIAGNOSIS — E1122 Type 2 diabetes mellitus with diabetic chronic kidney disease: Secondary | ICD-10-CM | POA: Diagnosis not present

## 2019-06-30 ENCOUNTER — Other Ambulatory Visit: Payer: Self-pay

## 2019-06-30 NOTE — Patient Outreach (Signed)
[facility] Prevent outreach-requested addtl info

## 2019-08-11 DIAGNOSIS — D649 Anemia, unspecified: Secondary | ICD-10-CM | POA: Diagnosis not present

## 2019-08-11 DIAGNOSIS — I10 Essential (primary) hypertension: Secondary | ICD-10-CM | POA: Diagnosis not present

## 2019-08-11 DIAGNOSIS — D509 Iron deficiency anemia, unspecified: Secondary | ICD-10-CM | POA: Diagnosis not present

## 2019-08-11 DIAGNOSIS — E782 Mixed hyperlipidemia: Secondary | ICD-10-CM | POA: Diagnosis not present

## 2019-08-11 DIAGNOSIS — E1122 Type 2 diabetes mellitus with diabetic chronic kidney disease: Secondary | ICD-10-CM | POA: Diagnosis not present

## 2019-08-13 DIAGNOSIS — D508 Other iron deficiency anemias: Secondary | ICD-10-CM | POA: Diagnosis not present

## 2019-08-13 DIAGNOSIS — E782 Mixed hyperlipidemia: Secondary | ICD-10-CM | POA: Diagnosis not present

## 2019-08-13 DIAGNOSIS — E1122 Type 2 diabetes mellitus with diabetic chronic kidney disease: Secondary | ICD-10-CM | POA: Diagnosis not present

## 2019-08-13 DIAGNOSIS — N183 Chronic kidney disease, stage 3 (moderate): Secondary | ICD-10-CM | POA: Diagnosis not present

## 2019-08-18 DIAGNOSIS — N183 Chronic kidney disease, stage 3 (moderate): Secondary | ICD-10-CM | POA: Diagnosis not present

## 2019-08-18 DIAGNOSIS — I2510 Atherosclerotic heart disease of native coronary artery without angina pectoris: Secondary | ICD-10-CM | POA: Diagnosis not present

## 2019-08-18 DIAGNOSIS — E1122 Type 2 diabetes mellitus with diabetic chronic kidney disease: Secondary | ICD-10-CM | POA: Diagnosis not present

## 2019-08-18 DIAGNOSIS — I10 Essential (primary) hypertension: Secondary | ICD-10-CM | POA: Diagnosis not present

## 2019-08-18 DIAGNOSIS — E782 Mixed hyperlipidemia: Secondary | ICD-10-CM | POA: Diagnosis not present

## 2019-09-02 DIAGNOSIS — E782 Mixed hyperlipidemia: Secondary | ICD-10-CM | POA: Diagnosis not present

## 2019-09-02 DIAGNOSIS — I10 Essential (primary) hypertension: Secondary | ICD-10-CM | POA: Diagnosis not present

## 2019-09-02 DIAGNOSIS — E1122 Type 2 diabetes mellitus with diabetic chronic kidney disease: Secondary | ICD-10-CM | POA: Diagnosis not present

## 2019-09-02 DIAGNOSIS — N183 Chronic kidney disease, stage 3 (moderate): Secondary | ICD-10-CM | POA: Diagnosis not present

## 2019-09-02 DIAGNOSIS — I2510 Atherosclerotic heart disease of native coronary artery without angina pectoris: Secondary | ICD-10-CM | POA: Diagnosis not present

## 2019-09-11 DIAGNOSIS — H3321 Serous retinal detachment, right eye: Secondary | ICD-10-CM | POA: Diagnosis not present

## 2019-09-11 DIAGNOSIS — H3530 Unspecified macular degeneration: Secondary | ICD-10-CM | POA: Diagnosis not present

## 2019-09-11 DIAGNOSIS — H31012 Macula scars of posterior pole (postinflammatory) (post-traumatic), left eye: Secondary | ICD-10-CM | POA: Diagnosis not present

## 2019-09-25 DIAGNOSIS — E782 Mixed hyperlipidemia: Secondary | ICD-10-CM | POA: Diagnosis not present

## 2019-09-25 DIAGNOSIS — D508 Other iron deficiency anemias: Secondary | ICD-10-CM | POA: Diagnosis not present

## 2019-09-25 DIAGNOSIS — E1122 Type 2 diabetes mellitus with diabetic chronic kidney disease: Secondary | ICD-10-CM | POA: Diagnosis not present

## 2019-09-25 DIAGNOSIS — I2510 Atherosclerotic heart disease of native coronary artery without angina pectoris: Secondary | ICD-10-CM | POA: Diagnosis not present

## 2019-09-25 DIAGNOSIS — I10 Essential (primary) hypertension: Secondary | ICD-10-CM | POA: Diagnosis not present

## 2019-11-05 ENCOUNTER — Other Ambulatory Visit: Payer: Self-pay

## 2019-11-05 ENCOUNTER — Encounter (HOSPITAL_COMMUNITY): Payer: Self-pay

## 2019-11-05 ENCOUNTER — Emergency Department (HOSPITAL_COMMUNITY): Payer: Medicare Other

## 2019-11-05 ENCOUNTER — Inpatient Hospital Stay (HOSPITAL_COMMUNITY)
Admission: EM | Admit: 2019-11-05 | Discharge: 2019-11-24 | DRG: 207 | Disposition: E | Payer: Medicare Other | Attending: Pulmonary Disease | Admitting: Pulmonary Disease

## 2019-11-05 DIAGNOSIS — R570 Cardiogenic shock: Secondary | ICD-10-CM | POA: Diagnosis not present

## 2019-11-05 DIAGNOSIS — I10 Essential (primary) hypertension: Secondary | ICD-10-CM | POA: Diagnosis not present

## 2019-11-05 DIAGNOSIS — J9601 Acute respiratory failure with hypoxia: Secondary | ICD-10-CM | POA: Diagnosis present

## 2019-11-05 DIAGNOSIS — I130 Hypertensive heart and chronic kidney disease with heart failure and stage 1 through stage 4 chronic kidney disease, or unspecified chronic kidney disease: Secondary | ICD-10-CM | POA: Diagnosis not present

## 2019-11-05 DIAGNOSIS — Z743 Need for continuous supervision: Secondary | ICD-10-CM | POA: Diagnosis not present

## 2019-11-05 DIAGNOSIS — R0902 Hypoxemia: Secondary | ICD-10-CM

## 2019-11-05 DIAGNOSIS — G9349 Other encephalopathy: Secondary | ICD-10-CM | POA: Diagnosis not present

## 2019-11-05 DIAGNOSIS — N183 Chronic kidney disease, stage 3 unspecified: Secondary | ICD-10-CM | POA: Diagnosis not present

## 2019-11-05 DIAGNOSIS — J1289 Other viral pneumonia: Secondary | ICD-10-CM | POA: Diagnosis present

## 2019-11-05 DIAGNOSIS — R001 Bradycardia, unspecified: Secondary | ICD-10-CM | POA: Diagnosis not present

## 2019-11-05 DIAGNOSIS — N179 Acute kidney failure, unspecified: Secondary | ICD-10-CM | POA: Diagnosis not present

## 2019-11-05 DIAGNOSIS — I350 Nonrheumatic aortic (valve) stenosis: Secondary | ICD-10-CM | POA: Diagnosis not present

## 2019-11-05 DIAGNOSIS — J1282 Pneumonia due to coronavirus disease 2019: Secondary | ICD-10-CM | POA: Diagnosis present

## 2019-11-05 DIAGNOSIS — E785 Hyperlipidemia, unspecified: Secondary | ICD-10-CM | POA: Diagnosis present

## 2019-11-05 DIAGNOSIS — Z7982 Long term (current) use of aspirin: Secondary | ICD-10-CM

## 2019-11-05 DIAGNOSIS — Y92239 Unspecified place in hospital as the place of occurrence of the external cause: Secondary | ICD-10-CM | POA: Diagnosis not present

## 2019-11-05 DIAGNOSIS — J189 Pneumonia, unspecified organism: Secondary | ICD-10-CM

## 2019-11-05 DIAGNOSIS — L8990 Pressure ulcer of unspecified site, unspecified stage: Secondary | ICD-10-CM | POA: Insufficient documentation

## 2019-11-05 DIAGNOSIS — I252 Old myocardial infarction: Secondary | ICD-10-CM

## 2019-11-05 DIAGNOSIS — Z20828 Contact with and (suspected) exposure to other viral communicable diseases: Secondary | ICD-10-CM

## 2019-11-05 DIAGNOSIS — Z6835 Body mass index (BMI) 35.0-35.9, adult: Secondary | ICD-10-CM

## 2019-11-05 DIAGNOSIS — H3530 Unspecified macular degeneration: Secondary | ICD-10-CM | POA: Diagnosis present

## 2019-11-05 DIAGNOSIS — L89303 Pressure ulcer of unspecified buttock, stage 3: Secondary | ICD-10-CM | POA: Diagnosis not present

## 2019-11-05 DIAGNOSIS — Z79899 Other long term (current) drug therapy: Secondary | ICD-10-CM

## 2019-11-05 DIAGNOSIS — I2510 Atherosclerotic heart disease of native coronary artery without angina pectoris: Secondary | ICD-10-CM | POA: Diagnosis not present

## 2019-11-05 DIAGNOSIS — R0602 Shortness of breath: Secondary | ICD-10-CM

## 2019-11-05 DIAGNOSIS — R579 Shock, unspecified: Secondary | ICD-10-CM | POA: Diagnosis not present

## 2019-11-05 DIAGNOSIS — Z9911 Dependence on respirator [ventilator] status: Secondary | ICD-10-CM | POA: Diagnosis not present

## 2019-11-05 DIAGNOSIS — R509 Fever, unspecified: Secondary | ICD-10-CM | POA: Diagnosis not present

## 2019-11-05 DIAGNOSIS — Z4659 Encounter for fitting and adjustment of other gastrointestinal appliance and device: Secondary | ICD-10-CM

## 2019-11-05 DIAGNOSIS — Z4682 Encounter for fitting and adjustment of non-vascular catheter: Secondary | ICD-10-CM | POA: Diagnosis not present

## 2019-11-05 DIAGNOSIS — Z794 Long term (current) use of insulin: Secondary | ICD-10-CM

## 2019-11-05 DIAGNOSIS — T380X5A Adverse effect of glucocorticoids and synthetic analogues, initial encounter: Secondary | ICD-10-CM | POA: Diagnosis not present

## 2019-11-05 DIAGNOSIS — I480 Paroxysmal atrial fibrillation: Secondary | ICD-10-CM | POA: Diagnosis present

## 2019-11-05 DIAGNOSIS — Z20822 Contact with and (suspected) exposure to covid-19: Secondary | ICD-10-CM

## 2019-11-05 DIAGNOSIS — Z86010 Personal history of colonic polyps: Secondary | ICD-10-CM

## 2019-11-05 DIAGNOSIS — Z951 Presence of aortocoronary bypass graft: Secondary | ICD-10-CM

## 2019-11-05 DIAGNOSIS — J129 Viral pneumonia, unspecified: Secondary | ICD-10-CM | POA: Diagnosis present

## 2019-11-05 DIAGNOSIS — R531 Weakness: Secondary | ICD-10-CM | POA: Diagnosis not present

## 2019-11-05 DIAGNOSIS — U071 COVID-19: Principal | ICD-10-CM | POA: Diagnosis present

## 2019-11-05 DIAGNOSIS — E876 Hypokalemia: Secondary | ICD-10-CM | POA: Diagnosis not present

## 2019-11-05 DIAGNOSIS — Z66 Do not resuscitate: Secondary | ICD-10-CM | POA: Diagnosis not present

## 2019-11-05 DIAGNOSIS — J9600 Acute respiratory failure, unspecified whether with hypoxia or hypercapnia: Secondary | ICD-10-CM

## 2019-11-05 DIAGNOSIS — I5032 Chronic diastolic (congestive) heart failure: Secondary | ICD-10-CM | POA: Diagnosis not present

## 2019-11-05 DIAGNOSIS — E1122 Type 2 diabetes mellitus with diabetic chronic kidney disease: Secondary | ICD-10-CM | POA: Diagnosis not present

## 2019-11-05 DIAGNOSIS — J9690 Respiratory failure, unspecified, unspecified whether with hypoxia or hypercapnia: Secondary | ICD-10-CM | POA: Diagnosis not present

## 2019-11-05 DIAGNOSIS — J80 Acute respiratory distress syndrome: Secondary | ICD-10-CM

## 2019-11-05 DIAGNOSIS — Z8249 Family history of ischemic heart disease and other diseases of the circulatory system: Secondary | ICD-10-CM | POA: Diagnosis not present

## 2019-11-05 DIAGNOSIS — Z9289 Personal history of other medical treatment: Secondary | ICD-10-CM

## 2019-11-05 DIAGNOSIS — E1165 Type 2 diabetes mellitus with hyperglycemia: Secondary | ICD-10-CM | POA: Diagnosis not present

## 2019-11-05 DIAGNOSIS — E669 Obesity, unspecified: Secondary | ICD-10-CM | POA: Diagnosis present

## 2019-11-05 DIAGNOSIS — R918 Other nonspecific abnormal finding of lung field: Secondary | ICD-10-CM | POA: Diagnosis not present

## 2019-11-05 DIAGNOSIS — J984 Other disorders of lung: Secondary | ICD-10-CM | POA: Diagnosis not present

## 2019-11-05 DIAGNOSIS — R58 Hemorrhage, not elsewhere classified: Secondary | ICD-10-CM | POA: Diagnosis not present

## 2019-11-05 DIAGNOSIS — E870 Hyperosmolality and hypernatremia: Secondary | ICD-10-CM | POA: Diagnosis not present

## 2019-11-05 DIAGNOSIS — E1121 Type 2 diabetes mellitus with diabetic nephropathy: Secondary | ICD-10-CM | POA: Diagnosis present

## 2019-11-05 DIAGNOSIS — Z01818 Encounter for other preprocedural examination: Secondary | ICD-10-CM

## 2019-11-05 DIAGNOSIS — D509 Iron deficiency anemia, unspecified: Secondary | ICD-10-CM | POA: Diagnosis not present

## 2019-11-05 LAB — CBC WITH DIFFERENTIAL/PLATELET
Abs Immature Granulocytes: 0.01 10*3/uL (ref 0.00–0.07)
Basophils Absolute: 0 10*3/uL (ref 0.0–0.1)
Basophils Relative: 0 %
Eosinophils Absolute: 0 10*3/uL (ref 0.0–0.5)
Eosinophils Relative: 0 %
HCT: 40.4 % (ref 36.0–46.0)
Hemoglobin: 11.9 g/dL — ABNORMAL LOW (ref 12.0–15.0)
Immature Granulocytes: 0 %
Lymphocytes Relative: 37 %
Lymphs Abs: 2.5 10*3/uL (ref 0.7–4.0)
MCH: 23.9 pg — ABNORMAL LOW (ref 26.0–34.0)
MCHC: 29.5 g/dL — ABNORMAL LOW (ref 30.0–36.0)
MCV: 81.3 fL (ref 80.0–100.0)
Monocytes Absolute: 0.6 10*3/uL (ref 0.1–1.0)
Monocytes Relative: 9 %
Neutro Abs: 3.6 10*3/uL (ref 1.7–7.7)
Neutrophils Relative %: 54 %
Platelets: 197 10*3/uL (ref 150–400)
RBC: 4.97 MIL/uL (ref 3.87–5.11)
RDW: 13.9 % (ref 11.5–15.5)
WBC: 6.7 10*3/uL (ref 4.0–10.5)
nRBC: 0 % (ref 0.0–0.2)

## 2019-11-05 LAB — COMPREHENSIVE METABOLIC PANEL
ALT: 26 U/L (ref 0–44)
AST: 42 U/L — ABNORMAL HIGH (ref 15–41)
Albumin: 3.5 g/dL (ref 3.5–5.0)
Alkaline Phosphatase: 82 U/L (ref 38–126)
Anion gap: 8 (ref 5–15)
BUN: 20 mg/dL (ref 8–23)
CO2: 29 mmol/L (ref 22–32)
Calcium: 7.7 mg/dL — ABNORMAL LOW (ref 8.9–10.3)
Chloride: 104 mmol/L (ref 98–111)
Creatinine, Ser: 1.62 mg/dL — ABNORMAL HIGH (ref 0.44–1.00)
GFR calc Af Amer: 34 mL/min — ABNORMAL LOW (ref >60)
GFR calc non Af Amer: 30 mL/min — ABNORMAL LOW (ref >60)
Glucose, Bld: 181 mg/dL — ABNORMAL HIGH (ref 70–99)
Potassium: 3.7 mmol/L (ref 3.5–5.1)
Sodium: 141 mmol/L (ref 135–145)
Total Bilirubin: 0.9 mg/dL (ref 0.3–1.2)
Total Protein: 7.7 g/dL (ref 6.5–8.1)

## 2019-11-05 LAB — FIBRINOGEN: Fibrinogen: 572 mg/dL — ABNORMAL HIGH (ref 210–475)

## 2019-11-05 LAB — C-REACTIVE PROTEIN: CRP: 13 mg/dL — ABNORMAL HIGH (ref <1.0)

## 2019-11-05 LAB — LACTIC ACID, PLASMA
Lactic Acid, Venous: 1.2 mmol/L (ref 0.5–1.9)
Lactic Acid, Venous: 1 mmol/L (ref 0.5–1.9)

## 2019-11-05 LAB — PROCALCITONIN: Procalcitonin: 0.33 ng/mL

## 2019-11-05 LAB — D-DIMER, QUANTITATIVE: D-Dimer, Quant: 0.86 ug/mL-FEU — ABNORMAL HIGH (ref 0.00–0.50)

## 2019-11-05 LAB — LACTATE DEHYDROGENASE: LDH: 370 U/L — ABNORMAL HIGH (ref 98–192)

## 2019-11-05 LAB — TRIGLYCERIDES: Triglycerides: 85 mg/dL (ref <150)

## 2019-11-05 LAB — FERRITIN: Ferritin: 3642 ng/mL — ABNORMAL HIGH (ref 11–307)

## 2019-11-05 MED ORDER — ACETAMINOPHEN 325 MG PO TABS
650.0000 mg | ORAL_TABLET | Freq: Once | ORAL | Status: AC
  Administered 2019-11-05: 650 mg via ORAL
  Filled 2019-11-05: qty 2

## 2019-11-05 NOTE — ED Triage Notes (Signed)
Pt has been SOB and weak for the last few days. Is living with a COVID positive person. Temp of 101.8 with EMS. NAD. 97% on RA

## 2019-11-05 NOTE — H&P (Signed)
TRH H&P    Patient Demographics:    Stephanie Hendricks, is a 78 y.o. female  MRN: [id number]  DOB - 1941/08/17  Admit Date - 11/10/2019  Referring MD/NP/PA: Aundria Mems  Outpatient Primary MD for the patient is Celene Squibb, MD  Patient coming from: Home  Chief complaint-shortness of breath   HPI:    Stephanie Hendricks  is a 78 y.o. female, with history of CAD s/p CABG x3, CKD stage III, diabetes mellitus type 2, essential hypertension, hyperlipidemia, chronic diastolic CHF, paroxysmal atrial fibrillation postop CABG came to hospital with complaints of shortness of breath and fever.  Patient son was positive for Covid.  He has been sick for several days.  Patient says that she has been feeling weak with shortness of breath for past 3 days.  She does not use oxygen at home.  In the ED she was found to be hypoxic with O2 sat 72% on room air.  She was placed on 4 L of nasal cannula, O2 sats came up to mid 90s.  She was also found to be febrile with temperature 103 F. In the ED, lab work showed LDH 370, ferritin 3642, CRP 13, lactic acid 1.0, procalcitonin 0.33, fibrinogen 572, D-dimer 0.86.  She denies nausea vomiting or diarrhea. Denies abdominal pain or dysuria     Review of systems:    In addition to the HPI above,    All other systems reviewed and are negative.    Past History of the following :    Past Medical History:  Diagnosis Date  . Aortic stenosis    Minimal  . Bradycardia   . CAD (coronary artery disease)    Severe LM, LAD, and OM1 disease status post emergent CABG February 2018  . Chronic kidney disease, stage III (moderate)   . Degenerative joint disease   . Diabetes mellitus type II   . Essential hypertension, benign   . Hyperlipidemia   . Lower extremity edema   . Microcytic anemia   . NSTEMI (non-ST elevated myocardial infarction) ([place]) 01/2017  . Palpitations   . Postoperative  atrial fibrillation [facility])       Past Surgical History:  Procedure Laterality Date  . COLONOSCOPY  08/28/2011   sigmoid colon serated adenoma, next colonoscopy 08/2016  . COLONOSCOPY W/ POLYPECTOMY  2005   Dr. Tamela Oddi resected per patient  . CORONARY ARTERY BYPASS GRAFT N/A 02/16/2017   Procedure: CORONARY ARTERY BYPASS GRAFTING (CABG) x 3 (LIMA to LAD, SVG SEQUENTIALLY to RAMUS INTERMEDIATE and DISTAL CIRCUMFLEX) WITH ENDOSCOPIC HARVESTING OF RIGHT SAPHENOUS VEIN;  Surgeon: Grace Isaac, MD;  Location: [place];  Service: Open Heart Surgery;  Laterality: N/A;  . ESOPHAGOGASTRODUODENOSCOPY     remote, foreign body extraction  . ESOPHAGOGASTRODUODENOSCOPY  08/28/2011   hiatal hernia, erosion on fold of diaphragmatic hiatus (cameron lesion), antral and prepylori erosions, SB bx negative, gastric bx showed reactive gastropathy but no H.Pylori  . HEMORROIDECTOMY    . LEFT HEART CATH AND CORONARY ANGIOGRAPHY N/A 02/16/2017   Procedure: Left Heart Cath  and Coronary Angiography;  Surgeon: Burnell Blanks, MD;  Location: [facility] [facility];  Service: Cardiovascular;  Laterality: N/A;  . TONSILLECTOMY    . TUBAL LIGATION        Social History:      Social History   Tobacco Use  . Smoking status: Never Smoker  . Smokeless tobacco: Never Used  Substance Use Topics  . Alcohol use: No    Alcohol/week: 0.0 standard drinks       Family History :     Family History  Problem Relation Age of Onset  . Hypertension Father   . Coronary artery disease Sister   . Heart attack Brother        Deceased  . Colon cancer Neg Hx   . Liver disease Neg Hx       Home Medications:   Prior to Admission medications   Medication Sig Start Date End Date Taking? Authorizing Provider  acetaminophen (TYLENOL) 325 MG tablet Take 2 tablets (650 mg total) by mouth every 6 (six) hours as needed for mild pain. 03/02/17  Yes Barrett, Erin R, PA-C  amLODipine (NORVASC) 10 MG tablet Take 1 tablet (10 mg  total) by mouth daily. 03/02/17  Yes Barrett, Erin R, PA-C  aspirin 81 MG tablet Take 81 mg by mouth daily.     Yes [provider]  atorvastatin (LIPITOR) 20 MG tablet Take 1 tablet by mouth daily. 07/14/18  Yes [provider]  cloNIDine (CATAPRES) 0.2 MG tablet TAKE (1) TABLET BY MOUTH TWICE DAILY. Patient taking differently: Take 0.2 mg by mouth 2 (two) times daily.  01/16/19  Yes Satira Sark, MD  ferrous sulfate 325 (65 FE) MG tablet Take 1 tablet by mouth daily. 01/16/19  Yes [provider]  furosemide (LASIX) 40 MG tablet Take 1 tablet by mouth 2 (two) times daily. 07/14/18  Yes [provider]  potassium chloride SA (K-DUR,KLOR-CON) 20 MEQ tablet Take 2 tablets (40 mEq total) by mouth daily. 03/02/17  Yes Barrett, Erin R, PA-C  XULTOPHY 100-3.6 UNIT-MG/ML SOPN Inject 38 Units into the skin daily.  09/22/19  Yes [provider]     Allergies:    No Known Allergies   Physical Exam:   Vitals  Blood pressure (!) 128/50, pulse 72, temperature (!) 100.5 F (38.1 C), temperature source Oral, resp. rate 17, height 5\' 2"  (1.575 m), weight 86.2 kg, SpO2 95 %.  1.  General: Appears in no acute distress  2. Psychiatric: Alert, oriented x3, intact insight and judgment  3. Neurologic: Cranial nerves II through XII grossly intact, no focal deficit noted  4. HEENMT:  Atraumatic normocephalic, extraocular muscles are intact  5. Respiratory : Clear to auscultation bilaterally, no wheezing or crackles  6. Cardiovascular : S1-S2, regular, no murmur auscultated  7. Gastrointestinal:  Abdomen is soft, nontender, no organomegaly  8. Skin:  No rashes noted     Data Review:    CBC Recent Labs  Lab 11/08/2019 1857  WBC 6.7  HGB 11.9*  HCT 40.4  PLT 197  MCV 81.3  MCH 23.9*  MCHC 29.5*  RDW 13.9  LYMPHSABS 2.5  MONOABS 0.6  EOSABS 0.0  BASOSABS 0.0    ------------------------------------------------------------------------------------------------------------------  Results for orders placed or performed during the hospital encounter of 11/08/2019 (from the past 48 hour(s))  Lactic acid, plasma     Status: None   Collection Time: 11/17/2019  6:57 PM  Result Value Ref Range   Lactic Acid, Venous  1.2 0.5 - 1.9 mmol/L    Comment: Performed at [facility], [address], [place], [place] [address]  Blood Culture (routine x 2)     Status: None (Preliminary result)   Collection Time: 11/15/2019  6:57 PM   Specimen: Left Antecubital; Blood  Result Value Ref Range   Specimen Description LEFT ANTECUBITAL    Special Requests      BOTTLES DRAWN AEROBIC AND ANAEROBIC Blood Culture adequate volume Performed at [facility], [address], [place], [place] [address]    Culture PENDING    Report Status PENDING   CBC WITH DIFFERENTIAL     Status: Abnormal   Collection Time: 11/22/2019  6:57 PM  Result Value Ref Range   WBC 6.7 4.0 - 10.5 K/uL   RBC 4.97 3.87 - 5.11 MIL/uL   Hemoglobin 11.9 (L) 12.0 - 15.0 g/dL   HCT 40.4 36.0 - 46.0 %   MCV 81.3 80.0 - 100.0 fL   MCH 23.9 (L) 26.0 - 34.0 pg   MCHC 29.5 (L) 30.0 - 36.0 g/dL   RDW 13.9 11.5 - 15.5 %   Platelets 197 150 - 400 K/uL   nRBC 0.0 0.0 - 0.2 %   Neutrophils Relative % 54 %   Neutro Abs 3.6 1.7 - 7.7 K/uL   Lymphocytes Relative 37 %   Lymphs Abs 2.5 0.7 - 4.0 K/uL   Monocytes Relative 9 %   Monocytes Absolute 0.6 0.1 - 1.0 K/uL   Eosinophils Relative 0 %   Eosinophils Absolute 0.0 0.0 - 0.5 K/uL   Basophils Relative 0 %   Basophils Absolute 0.0 0.0 - 0.1 K/uL   Immature Granulocytes 0 %   Abs Immature Granulocytes 0.01 0.00 - 0.07 K/uL    Comment: Performed at [facility], [address], [place], [place] [address]  Comprehensive metabolic panel     Status: Abnormal   Collection Time: 11/04/2019  6:57 PM  Result Value Ref Range   Sodium 141 135 - 145 mmol/L   Potassium 3.7 3.5 - 5.1  mmol/L   Chloride 104 98 - 111 mmol/L   CO2 29 22 - 32 mmol/L   Glucose, Bld 181 (H) 70 - 99 mg/dL   BUN 20 8 - 23 mg/dL   Creatinine, Ser 1.62 (H) 0.44 - 1.00 mg/dL   Calcium 7.7 (L) 8.9 - 10.3 mg/dL   Total Protein 7.7 6.5 - 8.1 g/dL   Albumin 3.5 3.5 - 5.0 g/dL   AST 42 (H) 15 - 41 U/L   ALT 26 0 - 44 U/L   Alkaline Phosphatase 82 38 - 126 U/L   Total Bilirubin 0.9 0.3 - 1.2 mg/dL   GFR calc non Af Amer 30 (L) >60 mL/min   GFR calc Af Amer 34 (L) >60 mL/min   Anion gap 8 5 - 15    Comment: Performed at [facility], [address] [place],  [address]  D-dimer, quantitative     Status: Abnormal   Collection Time: 11/01/2019  6:57 PM  Result Value Ref Range   D-Dimer, Quant 0.86 (H) 0.00 - 0.50 ug/mL-FEU    Comment: (NOTE) At the manufacturer cut-off of 0.50 ug/mL FEU, this assay has been documented to exclude PE with a sensitivity and negative predictive value of 97 to 99%.  At this time, this assay has not been approved by the FDA to exclude DVT/VTE. Results should be correlated with clinical presentation. Performed at [facility], [address], [place], [place]  [address]   Procalcitonin     Status: None   Collection Time: 10/25/2019  6:57 PM  Result Value Ref Range   Procalcitonin 0.33 ng/mL    Comment:        Interpretation: PCT (Procalcitonin) <= 0.5 ng/mL: Systemic infection (sepsis) is not likely. Local bacterial infection is possible. (NOTE)       Sepsis PCT Algorithm           Lower Respiratory Tract                                      Infection PCT Algorithm    ----------------------------     ----------------------------         PCT < 0.25 ng/mL                PCT < 0.10 ng/mL         Strongly encourage             Strongly discourage   discontinuation of antibiotics    initiation of antibiotics    ----------------------------     -----------------------------       PCT 0.25 - 0.50 ng/mL            PCT 0.10 - 0.25 ng/mL               OR       >80%  decrease in PCT            Discourage initiation of                                            antibiotics      Encourage discontinuation           of antibiotics    ----------------------------     -----------------------------         PCT >= 0.50 ng/mL              PCT 0.26 - 0.50 ng/mL               AND        <80% decrease in PCT             Encourage initiation of                                             antibiotics       Encourage continuation           of antibiotics    ----------------------------     -----------------------------        PCT >= 0.50 ng/mL                  PCT > 0.50 ng/mL               AND         increase in PCT                  Strongly encourage  initiation of antibiotics    Strongly encourage escalation           of antibiotics                                     -----------------------------                                           PCT <= 0.25 ng/mL                                                 OR                                        > 80% decrease in PCT                                     Discontinue / Do not initiate                                             antibiotics Performed at [facility], [address], [place], [place] [address]   Lactate dehydrogenase     Status: Abnormal   Collection Time: 10/29/2019  6:57 PM  Result Value Ref Range   LDH 370 (H) 98 - 192 U/L    Comment: Performed at [facility], [address], [place], [place] [address]  Fibrinogen     Status: Abnormal   Collection Time: 11/22/2019  6:57 PM  Result Value Ref Range   Fibrinogen 572 (H) 210 - 475 mg/dL    Comment: Performed at [facility], [address], [place], [place] [address]  Ferritin     Status: Abnormal   Collection Time: 11/04/2019  7:21 PM  Result Value Ref Range   Ferritin 3,642 (H) 11 - 307 ng/mL    Comment: Performed at [facility], [address] [place], [place] [address]  Triglycerides     Status: None    Collection Time: 11/20/2019  7:21 PM  Result Value Ref Range   Triglycerides 85 <150 mg/dL    Comment: Performed at [facility], [address], [place], [place] [address]  C-reactive protein     Status: Abnormal   Collection Time: 11/08/2019  7:21 PM  Result Value Ref Range   CRP 13.0 (H) <1.0 mg/dL    Comment: Performed at [facility], [address], [place], [place] [address]  Lactic acid, plasma     Status: None   Collection Time: 11/20/2019  9:24 PM  Result Value Ref Range   Lactic Acid, Venous 1.0 0.5 - 1.9 mmol/L    Comment: Performed at [facility], [address] [place], [place] [address]    Chemistries  Recent Labs  Lab 11/06/2019 1857  NA 141  K 3.7  CL 104  CO2 29  GLUCOSE 181*  BUN 20  CREATININE 1.62*  CALCIUM  7.7*  AST 42*  ALT 26  ALKPHOS 82  BILITOT 0.9   ------------------------------------------------------------------------------------------------------------------  ------------------------------------------------------------------------------------------------------------------ GFR: Estimated Creatinine Clearance: 28.2 mL/min (A) (by C-G formula based on SCr of 1.62 mg/dL (H)). Liver Function Tests: Recent Labs  Lab 10/25/2019 1857  AST 42*  ALT 26  ALKPHOS 82  BILITOT 0.9  PROT 7.7  ALBUMIN 3.5   Lipid Profile: Recent Labs    11/21/2019 1921  TRIG 85   Thyroid Function Tests: No results for input(s): TSH, T4TOTAL, FREET4, T3FREE, THYROIDAB in the last 72 hours. Anemia Panel: Recent Labs    10/30/2019 1921  FERRITIN 3,642*    --------------------------------------------------------------------------------------------------------------- Urine analysis:    Component Value Date/Time   COLORURINE STRAW (A) 12/10/2018 0805   APPEARANCEUR CLEAR 12/10/2018 0805   LABSPEC 1.009 12/10/2018 0805   PHURINE 5.0 12/10/2018 0805   GLUCOSEU NEGATIVE 12/10/2018 0805   HGBUR NEGATIVE 12/10/2018 0805   BILIRUBINUR NEGATIVE 12/10/2018 0805   KETONESUR  NEGATIVE 12/10/2018 0805   PROTEINUR NEGATIVE 12/10/2018 0805   UROBILINOGEN 0.2 11/06/2014 0234   NITRITE NEGATIVE 12/10/2018 0805   LEUKOCYTESUR NEGATIVE 12/10/2018 0805      Imaging Results:    Dg Chest Port 1 View  Result Date: 11/03/2019 CLINICAL DATA:  Shortness of breath and fever for several days, history of COVID-19 exposure EXAM: PORTABLE CHEST 1 VIEW COMPARISON:  01/30/2018 FINDINGS: Cardiac shadow is mildly prominent but stable. Postsurgical changes are again seen. The lungs are well aerated bilaterally with patchy bibasilar airspace opacities. Some patchy opacities are noted in the left upper lobe as well. IMPRESSION: Patchy bilateral airspace opacities. Given the recent exposure COVID-19 is a distinct possibility. Correlate with laboratory testing. Electronically Signed   By: Inez Catalina M.D.   On: 11/02/2019 19:38    My personal review of EKG: Rhythm NSR, no ST-T changes   Assessment & Plan:    Active Problems:   Viral pneumonia   1. Acute hypoxic respiratory failure-strong suspicion for Covid 19 infection, SARS-CoV-2 test is obtained in the ED, result is currently pending.  Patient is requiring 4 L of oxygen via nasal cannula.  Chest x-ray showed patchy bilateral airspace opacities.  Will start Decadron 6 mg IV every 24 hours.  Consider starting remdesivir if COVID-19 test comes back positive.  Patient will be transferred to [facility] long hospital as PUI.  2. Chronic diastolic CHF-appears euvolemic, hold Lasix at this time, considering worsening of renal function.  Patient's creatinine today is 1.62, previous creatinine was 1.39 in December 2019.  Follow BMP in a.m.  Consider restarting Lasix if patient develops worsening of CHF.  3. Hypertension-continue Norvasc, Catapres.  4. Diabetes mellitus type 2-we will start sliding scale insulin with NovoLog, Lantus 20 units subcu daily.  Based on patient's blood glucose, consider adding meal coverage 3 times daily.  5. CAD s/p  CABG-continue aspirin, Lipitor    DVT Prophylaxis-   Lovenox   AM Labs Ordered, also please review Full Orders  Family Communication: Admission, patients condition and plan of care including tests being ordered have been discussed with the patient  who indicate understanding and agree with the plan and Code Status.  Code Status: Full code  Admission status: Inpatient: Based on patients clinical presentation and evaluation of above clinical data, I have made determination that patient meets Inpatient criteria at this time.  Time spent in minutes : 60 minutes   Oswald Hillock M.D on 11/07/2019 at 11:25 PM

## 2019-11-05 NOTE — ED Provider Notes (Signed)
[facility] EMERGENCY DEPARTMENT Provider Note   CSN: [id number] Arrival date & time: 11/13/2019  1759     History   Chief Complaint Chief Complaint  Patient presents with  . Shortness of Breath  . Fever    HPI Stephanie PERROTTI is a 78 y.o. female.     Patient with about 3-day complaint of feeling weak shortness of breath and fever.  Patient has a family member her son in the household positive for Covid.  He has been sick for several days.  Patient's past medical history is significant for hypertension hyperlipidemia diabetes chronic kidney disease stage III non-STEMI in 2018 coronary artery disease had a CABG in 2018.  Patient does not use oxygen at home.  Arrived here with oxygen saturation at 72% on room air.  Placed on 4 L nasal cannula oxygen saturation came up to mid 90s.  Temp was noted here to be 103.  Patient was not hypotensive.  Not tachycardic.  Little bit tachypneic on arrival with respiratory rate of 21 but that improved after going on the oxygen.     Past Medical History:  Diagnosis Date  . Aortic stenosis    Minimal  . Bradycardia   . CAD (coronary artery disease)    Severe LM, LAD, and OM1 disease status post emergent CABG February 2018  . Chronic kidney disease, stage III (moderate)   . Degenerative joint disease   . Diabetes mellitus type II   . Essential hypertension, benign   . Hyperlipidemia   . Lower extremity edema   . Microcytic anemia   . NSTEMI (non-ST elevated myocardial infarction) ([place]) 01/2017  . Palpitations   . Postoperative atrial fibrillation [facility])     Patient Active Problem List   Diagnosis Date Noted  . Paroxysmal atrial fibrillation ([place]) post op CABG 03/14/2017  . Chronic diastolic CHF (congestive heart failure) ([place])   . S/P CABG x 3   . AKI (acute kidney injury) ([place])   . Benign essential HTN   . Diastolic dysfunction   . Acute blood loss anemia   . Leukocytosis   . Tachypnea   . NSTEMI (non-ST elevated myocardial  infarction) ([place]) 02/16/2017  . Coronary artery disease 02/16/2017  . Acute gastroenteritis 11/07/2014  . Intractable nausea and vomiting 11/06/2014  . Obesity 11/06/2014  . Chronic kidney disease (CKD), stage III (moderate) 11/06/2014  . Microcytic anemia 11/06/2014  . Aortic valve disorder 10/22/2012  . Bradycardia   . Essential hypertension   . Hyperlipidemia   . Type II diabetes mellitus with nephropathy ([place])   . Hx of colonic polyps 08/21/2011    Past Surgical History:  Procedure Laterality Date  . COLONOSCOPY  08/28/2011   sigmoid colon serated adenoma, next colonoscopy 08/2016  . COLONOSCOPY W/ POLYPECTOMY  2005   Dr. Tamela Oddi resected per patient  . CORONARY ARTERY BYPASS GRAFT N/A 02/16/2017   Procedure: CORONARY ARTERY BYPASS GRAFTING (CABG) x 3 (LIMA to LAD, SVG SEQUENTIALLY to RAMUS INTERMEDIATE and DISTAL CIRCUMFLEX) WITH ENDOSCOPIC HARVESTING OF RIGHT SAPHENOUS VEIN;  Surgeon: Grace Isaac, MD;  Location: [place];  Service: Open Heart Surgery;  Laterality: N/A;  . ESOPHAGOGASTRODUODENOSCOPY     remote, foreign body extraction  . ESOPHAGOGASTRODUODENOSCOPY  08/28/2011   hiatal hernia, erosion on fold of diaphragmatic hiatus (cameron lesion), antral and prepylori erosions, SB bx negative, gastric bx showed reactive gastropathy but no H.Pylori  . HEMORROIDECTOMY    . LEFT HEART CATH AND CORONARY ANGIOGRAPHY N/A 02/16/2017  Procedure: Left Heart Cath and Coronary Angiography;  Surgeon: Burnell Blanks, MD;  Location: [place] CV LAB;  Service: Cardiovascular;  Laterality: N/A;  . TONSILLECTOMY    . TUBAL LIGATION       OB History   No obstetric history on file.      Home Medications    Prior to Admission medications   Medication Sig Start Date End Date Taking? Authorizing Provider  ferrous sulfate 325 (65 FE) MG tablet Take 1 tablet by mouth daily. 01/16/19  Yes [provider]  acetaminophen (TYLENOL) 325 MG tablet Take 2 tablets (650 mg  total) by mouth every 6 (six) hours as needed for mild pain. 03/02/17   Barrett, Erin R, PA-C  amLODipine (NORVASC) 10 MG tablet Take 1 tablet (10 mg total) by mouth daily. 03/02/17   Barrett, Erin R, PA-C  aspirin 81 MG tablet Take 81 mg by mouth daily.      [provider]  atorvastatin (LIPITOR) 20 MG tablet Take 1 tablet by mouth daily. 07/14/18   [provider]  cloNIDine (CATAPRES) 0.2 MG tablet TAKE (1) TABLET BY MOUTH TWICE DAILY. Patient taking differently: Take 0.2 mg by mouth 2 (two) times daily.  01/16/19   Satira Sark, MD  furosemide (LASIX) 40 MG tablet Take 1 tablet by mouth 2 (two) times daily. 07/14/18   [provider]  methocarbamol (ROBAXIN) 500 MG tablet Take 1 tablet (500 mg total) by mouth every 8 (eight) hours as needed for muscle spasms. 12/10/18   Julianne Rice, MD  potassium chloride SA (K-DUR,KLOR-CON) 20 MEQ tablet Take 2 tablets (40 mEq total) by mouth daily. 03/02/17   Barrett, Erin R, PA-C  SOLIQUA 100-33 UNT-MCG/ML SOPN Inject 30 Units into the skin daily.  05/15/18   [provider]  XULTOPHY 100-3.6 UNIT-MG/ML SOPN Inject 35 Units into the skin daily. 09/22/19   [provider]    Family History Family History  Problem Relation Age of Onset  . Hypertension Father   . Coronary artery disease Sister   . Heart attack Brother        Deceased  . Colon cancer Neg Hx   . Liver disease Neg Hx     Social History Social History   Tobacco Use  . Smoking status: Never Smoker  . Smokeless tobacco: Never Used  Substance Use Topics  . Alcohol use: No    Alcohol/week: 0.0 standard drinks  . Drug use: No     Allergies   Patient has no known allergies.   Review of Systems Review of Systems  Constitutional: Positive for fever. Negative for chills.  HENT: Negative for congestion, rhinorrhea and sore throat.   Eyes: Negative for visual disturbance.  Respiratory: Positive for cough. Negative for shortness of  breath.   Cardiovascular: Negative for chest pain and leg swelling.  Gastrointestinal: Negative for abdominal pain, diarrhea, nausea and vomiting.  Genitourinary: Negative for dysuria.  Musculoskeletal: Negative for back pain and neck pain.  Skin: Negative for rash.  Neurological: Positive for weakness. Negative for dizziness, light-headedness and headaches.  Hematological: Does not bruise/bleed easily.  Psychiatric/Behavioral: Negative for confusion.     Physical Exam Updated Vital Signs BP 111/61   Pulse 91   Temp (!) 103.1 F (39.5 C)   Resp 18   Ht 1.575 m (5\' 2" )   Wt 86.2 kg   SpO2 98%   BMI 34.75 kg/m   Physical Exam Vitals signs and nursing note reviewed.  Constitutional:  General: She is not in acute distress.    Appearance: Normal appearance. She is well-developed. She is not ill-appearing, toxic-appearing or diaphoretic.  HENT:     Head: Normocephalic and atraumatic.     Mouth/Throat:     Mouth: Mucous membranes are moist.  Eyes:     Extraocular Movements: Extraocular movements intact.     Conjunctiva/sclera: Conjunctivae normal.     Pupils: Pupils are equal, round, and reactive to light.  Neck:     Musculoskeletal: Normal range of motion and neck supple.  Cardiovascular:     Rate and Rhythm: Normal rate and regular rhythm.     Heart sounds: No murmur.  Pulmonary:     Effort: Pulmonary effort is normal. No respiratory distress.     Breath sounds: Normal breath sounds.  Abdominal:     Palpations: Abdomen is soft.     Tenderness: There is no abdominal tenderness.  Musculoskeletal: Normal range of motion.  Skin:    General: Skin is warm and dry.  Neurological:     General: No focal deficit present.     Mental Status: She is alert and oriented to person, place, and time.      ED Treatments / Results  Labs (all labs ordered are listed, but only abnormal results are displayed) Labs Reviewed  CBC WITH DIFFERENTIAL/PLATELET - Abnormal; Notable for  the following components:      Result Value   Hemoglobin 11.9 (*)    MCH 23.9 (*)    MCHC 29.5 (*)    All other components within normal limits  COMPREHENSIVE METABOLIC PANEL - Abnormal; Notable for the following components:   Glucose, Bld 181 (*)    Creatinine, Ser 1.62 (*)    Calcium 7.7 (*)    AST 42 (*)    GFR calc non Af Amer 30 (*)    GFR calc Af Amer 34 (*)    All other components within normal limits  D-DIMER, QUANTITATIVE (NOT AT [facility]) - Abnormal; Notable for the following components:   D-Dimer, Quant 0.86 (*)    All other components within normal limits  LACTATE DEHYDROGENASE - Abnormal; Notable for the following components:   LDH 370 (*)    All other components within normal limits  FIBRINOGEN - Abnormal; Notable for the following components:   Fibrinogen 572 (*)    All other components within normal limits  SARS CORONAVIRUS 2 (TAT 6-24 HRS)  CULTURE, BLOOD (ROUTINE X 2)  CULTURE, BLOOD (ROUTINE X 2)  TRIGLYCERIDES  LACTIC ACID, PLASMA  LACTIC ACID, PLASMA  PROCALCITONIN  FERRITIN  C-REACTIVE PROTEIN    EKG EKG Interpretation  Date/Time:  Thursday November 05 2019 18:19:38 EST Ventricular Rate:  82 PR Interval:    QRS Duration: 82 QT Interval:  356 QTC Calculation: 401 R Axis:   6 Text Interpretation: Sinus rhythm Paired ventricular premature complexes Low voltage, extremity and precordial leads Repol abnrm suggests ischemia, lateral leads Interpretation limited secondary to artifact Baseline wander in lead(s) V2 Confirmed by Fredia Sorrow [phone]) on 11/02/2019 7:26:02 PM   Radiology Dg Chest Port 1 View  Result Date: 11/03/2019 CLINICAL DATA:  Shortness of breath and fever for several days, history of COVID-19 exposure EXAM: PORTABLE CHEST 1 VIEW COMPARISON:  01/30/2018 FINDINGS: Cardiac shadow is mildly prominent but stable. Postsurgical changes are again seen. The lungs are well aerated bilaterally with patchy bibasilar airspace opacities. Some  patchy opacities are noted in the left upper lobe as well. IMPRESSION: Patchy bilateral airspace opacities.  Given the recent exposure COVID-19 is a distinct possibility. Correlate with laboratory testing. Electronically Signed   By: Inez Catalina M.D.   On: 10/25/2019 19:38    Procedures Procedures (including critical care time)  Medications Ordered in ED Medications  acetaminophen (TYLENOL) tablet 650 mg (650 mg Oral Given 11/04/2019 1820)     Initial Impression / Assessment and Plan / ED Course  I have reviewed the triage vital signs and the nursing notes.  Pertinent labs & imaging results that were available during my care of the patient were reviewed by me and considered in my medical decision making (see chart for details).       Patient very stable on the 4 L nasal cannula oxygen.  Oxygen sats were 96%.  Patient in no acute distress currently.  Patient's past medical history and the presumptive diagnosis of COVID-19 infection very concerning.  Discussed with hospitalist will require admission.  Covid test ordered.  Chest x-ray consistent with a multifocal pneumonia.  Covid order panel used.   Final Clinical Impressions(s) / ED Diagnoses   Final diagnoses:  Suspected COVID-19 virus infection  Hypoxia  Multifocal pneumonia    ED Discharge Orders    None       Fredia Sorrow, MD 10/27/2019 2059

## 2019-11-05 NOTE — ED Notes (Signed)
O2 sats 98% on 4L

## 2019-11-06 ENCOUNTER — Encounter (HOSPITAL_COMMUNITY): Payer: Self-pay

## 2019-11-06 ENCOUNTER — Other Ambulatory Visit: Payer: Self-pay

## 2019-11-06 LAB — CBC WITH DIFFERENTIAL/PLATELET
Abs Immature Granulocytes: 0.02 10*3/uL (ref 0.00–0.07)
Basophils Absolute: 0 10*3/uL (ref 0.0–0.1)
Basophils Relative: 0 %
Eosinophils Absolute: 0 10*3/uL (ref 0.0–0.5)
Eosinophils Relative: 0 %
HCT: 36.9 % (ref 36.0–46.0)
Hemoglobin: 10.8 g/dL — ABNORMAL LOW (ref 12.0–15.0)
Immature Granulocytes: 0 %
Lymphocytes Relative: 37 %
Lymphs Abs: 2.1 10*3/uL (ref 0.7–4.0)
MCH: 24.1 pg — ABNORMAL LOW (ref 26.0–34.0)
MCHC: 29.3 g/dL — ABNORMAL LOW (ref 30.0–36.0)
MCV: 82.4 fL (ref 80.0–100.0)
Monocytes Absolute: 0.4 10*3/uL (ref 0.1–1.0)
Monocytes Relative: 6 %
Neutro Abs: 3.3 10*3/uL (ref 1.7–7.7)
Neutrophils Relative %: 57 %
Platelets: 166 10*3/uL (ref 150–400)
RBC: 4.48 MIL/uL (ref 3.87–5.11)
RDW: 14 % (ref 11.5–15.5)
WBC: 5.8 10*3/uL (ref 4.0–10.5)
nRBC: 0 % (ref 0.0–0.2)

## 2019-11-06 LAB — COMPREHENSIVE METABOLIC PANEL
ALT: 24 U/L (ref 0–44)
AST: 42 U/L — ABNORMAL HIGH (ref 15–41)
Albumin: 3.2 g/dL — ABNORMAL LOW (ref 3.5–5.0)
Alkaline Phosphatase: 68 U/L (ref 38–126)
Anion gap: 10 (ref 5–15)
BUN: 24 mg/dL — ABNORMAL HIGH (ref 8–23)
CO2: 31 mmol/L (ref 22–32)
Calcium: 7.8 mg/dL — ABNORMAL LOW (ref 8.9–10.3)
Chloride: 101 mmol/L (ref 98–111)
Creatinine, Ser: 1.69 mg/dL — ABNORMAL HIGH (ref 0.44–1.00)
GFR calc Af Amer: 33 mL/min — ABNORMAL LOW (ref >60)
GFR calc non Af Amer: 28 mL/min — ABNORMAL LOW (ref >60)
Glucose, Bld: 154 mg/dL — ABNORMAL HIGH (ref 70–99)
Potassium: 3.6 mmol/L (ref 3.5–5.1)
Sodium: 142 mmol/L (ref 135–145)
Total Bilirubin: 1 mg/dL (ref 0.3–1.2)
Total Protein: 7.2 g/dL (ref 6.5–8.1)

## 2019-11-06 LAB — C-REACTIVE PROTEIN: CRP: 12.9 mg/dL — ABNORMAL HIGH (ref <1.0)

## 2019-11-06 LAB — LACTIC ACID, PLASMA
Lactic Acid, Venous: 1 mmol/L (ref 0.5–1.9)
Lactic Acid, Venous: 1.2 mmol/L (ref 0.5–1.9)

## 2019-11-06 LAB — GLUCOSE, CAPILLARY
Glucose-Capillary: 141 mg/dL — ABNORMAL HIGH (ref 70–99)
Glucose-Capillary: 234 mg/dL — ABNORMAL HIGH (ref 70–99)
Glucose-Capillary: 336 mg/dL — ABNORMAL HIGH (ref 70–99)
Glucose-Capillary: 270 mg/dL — ABNORMAL HIGH (ref 70–99)
Glucose-Capillary: 197 mg/dL — ABNORMAL HIGH (ref 70–99)

## 2019-11-06 LAB — HEMOGLOBIN A1C
Hgb A1c MFr Bld: 8.7 % — ABNORMAL HIGH (ref 4.8–5.6)
Mean Plasma Glucose: 202.99 mg/dL

## 2019-11-06 LAB — D-DIMER, QUANTITATIVE: D-Dimer, Quant: 0.88 ug/mL-FEU — ABNORMAL HIGH (ref 0.00–0.50)

## 2019-11-06 LAB — ABO/RH: ABO/RH(D): B POS

## 2019-11-06 LAB — SARS CORONAVIRUS 2 (TAT 6-24 HRS): SARS Coronavirus 2: POSITIVE — AB

## 2019-11-06 MED ORDER — ASPIRIN EC 81 MG PO TBEC
81.0000 mg | DELAYED_RELEASE_TABLET | Freq: Every day | ORAL | Status: DC
  Administered 2019-11-06 – 2019-11-11 (×6): 81 mg via ORAL
  Filled 2019-11-06 (×6): qty 1

## 2019-11-06 MED ORDER — SODIUM CHLORIDE 0.9 % IV SOLN: INTRAVENOUS | Status: DC

## 2019-11-06 MED ORDER — CLONIDINE HCL 0.2 MG PO TABS
0.2000 mg | ORAL_TABLET | Freq: Two times a day (BID) | ORAL | Status: DC
  Administered 2019-11-06 – 2019-11-07 (×3): 0.2 mg via ORAL
  Filled 2019-11-06 (×3): qty 1

## 2019-11-06 MED ORDER — ACETAMINOPHEN 325 MG PO TABS
650.0000 mg | ORAL_TABLET | Freq: Four times a day (QID) | ORAL | Status: DC | PRN
  Administered 2019-11-06: 650 mg via ORAL
  Filled 2019-11-06 (×2): qty 2

## 2019-11-06 MED ORDER — ENOXAPARIN SODIUM 30 MG/0.3ML ~~LOC~~ SOLN
30.0000 mg | Freq: Every day | SUBCUTANEOUS | Status: DC
  Administered 2019-11-06: 30 mg via SUBCUTANEOUS
  Filled 2019-11-06: qty 0.3

## 2019-11-06 MED ORDER — FERROUS SULFATE 325 (65 FE) MG PO TABS
325.0000 mg | ORAL_TABLET | Freq: Every day | ORAL | Status: DC
  Administered 2019-11-06 – 2019-11-08 (×3): 325 mg via ORAL
  Filled 2019-11-06 (×4): qty 1

## 2019-11-06 MED ORDER — HEPARIN SODIUM (PORCINE) 5000 UNIT/ML IJ SOLN
5000.0000 [IU] | Freq: Three times a day (TID) | INTRAMUSCULAR | Status: DC
  Administered 2019-11-07: 5000 [IU] via SUBCUTANEOUS
  Filled 2019-11-06: qty 1

## 2019-11-06 MED ORDER — ONDANSETRON HCL 4 MG/2ML IJ SOLN: 4.0000 mg | Freq: Four times a day (QID) | INTRAMUSCULAR | Status: DC | PRN

## 2019-11-06 MED ORDER — SODIUM CHLORIDE 0.9 % IV SOLN
100.0000 mg | INTRAVENOUS | Status: DC
  Filled 2019-11-06 (×2): qty 20

## 2019-11-06 MED ORDER — AMLODIPINE BESYLATE 10 MG PO TABS
10.0000 mg | ORAL_TABLET | Freq: Every day | ORAL | Status: DC
  Administered 2019-11-06 – 2019-11-07 (×2): 10 mg via ORAL
  Filled 2019-11-06 (×2): qty 1

## 2019-11-06 MED ORDER — SODIUM CHLORIDE 0.9 % IV SOLN
200.0000 mg | Freq: Once | INTRAVENOUS | Status: AC
  Administered 2019-11-06: 200 mg via INTRAVENOUS
  Filled 2019-11-06: qty 40

## 2019-11-06 MED ORDER — ORAL CARE MOUTH RINSE
15.0000 mL | Freq: Two times a day (BID) | OROMUCOSAL | Status: DC
  Administered 2019-11-06 – 2019-11-08 (×5): 15 mL via OROMUCOSAL

## 2019-11-06 MED ORDER — ATORVASTATIN CALCIUM 10 MG PO TABS
20.0000 mg | ORAL_TABLET | Freq: Every day | ORAL | Status: DC
  Administered 2019-11-06 – 2019-11-07 (×2): 20 mg via ORAL
  Filled 2019-11-06 (×2): qty 1
  Filled 2019-11-06: qty 2

## 2019-11-06 MED ORDER — ENOXAPARIN SODIUM 40 MG/0.4ML ~~LOC~~ SOLN: 40.0000 mg | Freq: Every day | SUBCUTANEOUS | Status: DC

## 2019-11-06 MED ORDER — INSULIN GLARGINE 100 UNIT/ML ~~LOC~~ SOLN
20.0000 [IU] | Freq: Every day | SUBCUTANEOUS | Status: DC
  Administered 2019-11-06: 20 [IU] via SUBCUTANEOUS
  Filled 2019-11-06 (×2): qty 0.2

## 2019-11-06 MED ORDER — DEXAMETHASONE SODIUM PHOSPHATE 10 MG/ML IJ SOLN
6.0000 mg | Freq: Every day | INTRAMUSCULAR | Status: DC
  Administered 2019-11-06 – 2019-11-07 (×2): 6 mg via INTRAVENOUS
  Filled 2019-11-06 (×2): qty 0.6

## 2019-11-06 MED ORDER — ONDANSETRON HCL 4 MG PO TABS: 4.0000 mg | ORAL_TABLET | Freq: Four times a day (QID) | ORAL | Status: DC | PRN

## 2019-11-06 MED ORDER — INSULIN ASPART 100 UNIT/ML ~~LOC~~ SOLN
0.0000 [IU] | Freq: Three times a day (TID) | SUBCUTANEOUS | Status: DC
  Administered 2019-11-06: 5 [IU] via SUBCUTANEOUS
  Administered 2019-11-06: 3 [IU] via SUBCUTANEOUS
  Administered 2019-11-06: 7 [IU] via SUBCUTANEOUS
  Administered 2019-11-07: 3 [IU] via SUBCUTANEOUS
  Administered 2019-11-07: 2 [IU] via SUBCUTANEOUS

## 2019-11-06 NOTE — Progress Notes (Addendum)
Pt 85% on 5L, increased to 6L [place] and only 88%. Placed on 10L HFNC and now 91%. [place], NP and RT notified. NAD otherwise noted. Will continue to monitor.

## 2019-11-06 NOTE — Progress Notes (Addendum)
Pharmacy: Remdesivir   Patient is a 78 y.o. female with COVID.  Pharmacy has been consulted for remdesivir dosing.   - ALT: 24 - CXR shows: patchy bilateral airspace opacities    A/P:  - Patient meets criteria for remdesivir. Will initiate remdesivir 200 mg once followed by 100 mg daily x 4 days.  - Daily CMET while on remdesivir - Will f/u pt's ALT and clinical condition - Will change DVT ppx from Lovenox 30mg  daily to SQ UFH 5000 units q8 for CrCl < 30 per policy for COVID+ patient   Adrian Saran, PharmD, BCPS 11/06/2019 7:36 PM

## 2019-11-06 NOTE — Progress Notes (Signed)
Rx Brief note: Lovenox  Wt = 83 kg, CrCl~26 ml/min  Rx adjusted Lovenox to 30 mg daily in pt with CrCl<30 ml/min  Thanks Dorrene German 11/06/2019 6:11 AM

## 2019-11-06 NOTE — Progress Notes (Addendum)
PROGRESS NOTE    Stephanie Hendricks    Code Status: Full Code  [id number] DOB: 1941/08/05 DOA: 11/10/2019  PCP: Celene Squibb, MD    Hospital Summary  Stephanie Hendricks  is a 78 y.o. female, with history of CAD s/p CABG x3, CKD stage III, diabetes mellitus type 2, essential hypertension, hyperlipidemia, chronic diastolic CHF, paroxysmal atrial fibrillation postop CABG came to hospital with complaints of shortness of breath and fever.  Patient son was positive for Covid.  He has been sick for several days.  Patient says that she has been feeling weak with shortness of breath for past 3 days.  She does not use oxygen at home.  In the ED she was found to be hypoxic with O2 sat 72% on room air.  She was placed on 4 L of nasal cannula, O2 sats came up to mid 90s.  She was also found to be febrile with temperature 103 F. In the ED, lab work showed LDH 370, ferritin 3642, CRP 13, lactic acid 1.0, procalcitonin 0.33, fibrinogen 572, D-dimer 0.86.  Covid +11/13 To be transferred to [facility]   Active Problems:   Viral pneumonia   Acute hypoxic respiratory failure secondary to COVID-19 satting in mid to low 90s on 2 L nasal cannula.  Febrile this afternoon at 100.7 F, no conversational dyspnea. -Decadron 6 mg daily -Remdesivir -Titrate O2 to greater than 90% -Transfer to [facility] when bed available  Chronic diastolic heart failure, compensated Lasix held on admission due to worsening renal function.  Creatinine 1.62-> 1.69 -hold Lasix  Hypertension stable -Continue home Norvasc and Catapres  Type 2 diabetes HA1C 8.7 monitor glucose while on Decadron -Continue sliding scale  CAD status post CABG -Continue statin, aspirin   DVT prophylaxis: Lovenox Diet: Carb modified Family Communication: No family at bedside Disposition Plan: Transfer to [facility]  Consultants  None  Procedures  none  Antibiotics  Remdesivir      Subjective   Patient seen and examined at bedside in no acute  distress and resting comfortably. Eating her lunch. No acute events. Denies any acute complaints at this time other than some general malaise. Tolerating diet well.   Objective   Vitals:   11/06/19 0330 11/06/19 0334 11/06/19 0430 11/06/19 1417  BP: (!) 144/64  (!) 129/48 (!) 150/54  Pulse: 87  78 72  Resp: 13  20 18   Temp:  (!) 102.9 F (39.4 C) (!) 100.7 F (38.2 C) 98.2 F (36.8 C)  TempSrc:  Oral Oral Oral  SpO2: 94%  92% 93%  Weight:   83.4 kg   Height:   5\' 2"  (1.575 m)     Intake/Output Summary (Last 24 hours) at 11/06/2019 2128 Last data filed at 11/06/2019 1633 Gross per 24 hour  Intake 320 ml  Output -  Net 320 ml   Filed Weights   11/12/2019 1816 11/06/19 0430  Weight: 86.2 kg 83.4 kg    Examination:  Physical Exam Vitals signs and nursing note reviewed.  Constitutional:      General: She is not in acute distress.    Appearance: Normal appearance.  HENT:     Head: Normocephalic and atraumatic.     Comments: Nasal canula    Nose: Nose normal.     Mouth/Throat:     Mouth: Mucous membranes are moist.  Eyes:     Extraocular Movements: Extraocular movements intact.  Neck:     Musculoskeletal: No neck rigidity.  Cardiovascular:  Rate and Rhythm: Normal rate and regular rhythm.  Pulmonary:     Effort: Pulmonary effort is normal. No respiratory distress.     Breath sounds: Normal breath sounds.  Abdominal:     General: Abdomen is flat.     Palpations: Abdomen is soft.  Musculoskeletal: Normal range of motion.        General: No swelling.  Neurological:     General: No focal deficit present.     Mental Status: She is alert. Mental status is at baseline.  Psychiatric:        Mood and Affect: Mood normal.        Behavior: Behavior normal.     Data Reviewed: I have personally reviewed following labs and imaging studies  CBC: Recent Labs  Lab 11/13/2019 1857 11/06/19 0514  WBC 6.7 5.8  NEUTROABS 3.6 3.3  HGB 11.9* 10.8*  HCT 40.4 36.9  MCV  81.3 82.4  PLT 197 [id number]   Basic Metabolic Panel: Recent Labs  Lab 11/22/2019 1857 11/06/19 0514  NA 141 142  K 3.7 3.6  CL 104 101  CO2 29 31  GLUCOSE 181* 154*  BUN 20 24*  CREATININE 1.62* 1.69*  CALCIUM 7.7* 7.8*   GFR: Estimated Creatinine Clearance: 26.6 mL/min (A) (by C-G formula based on SCr of 1.69 mg/dL (H)). Liver Function Tests: Recent Labs  Lab 11/22/2019 1857 11/06/19 0514  AST 42* 42*  ALT 26 24  ALKPHOS 82 68  BILITOT 0.9 1.0  PROT 7.7 7.2  ALBUMIN 3.5 3.2*   No results for input(s): LIPASE, AMYLASE in the last 168 hours. No results for input(s): AMMONIA in the last 168 hours. Coagulation Profile: No results for input(s): INR, PROTIME in the last 168 hours. Cardiac Enzymes: No results for input(s): CKTOTAL, CKMB, CKMBINDEX, TROPONINI in the last 168 hours. BNP (last 3 results) No results for input(s): PROBNP in the last 8760 hours. HbA1C: Recent Labs    11/06/19 0514  HGBA1C 8.7*   CBG: Recent Labs  Lab 11/06/19 0518 11/06/19 0932 11/06/19 1313 11/06/19 1621  GLUCAP 141* 234* 336* 270*   Lipid Profile: Recent Labs    11/17/2019 1921  TRIG 85   Thyroid Function Tests: No results for input(s): TSH, T4TOTAL, FREET4, T3FREE, THYROIDAB in the last 72 hours. Anemia Panel: Recent Labs    11/08/2019 1921  FERRITIN 3,642*   Sepsis Labs: Recent Labs  Lab 10/25/2019 1857 11/10/2019 2124 11/06/19 0817 11/06/19 1120  PROCALCITON 0.33  --   --   --   LATICACIDVEN 1.2 1.0 1.0 1.2    Recent Results (from the past 240 hour(s))  SARS CORONAVIRUS 2 (TAT 6-24 HRS) Nasopharyngeal Nasopharyngeal Swab     Status: Abnormal   Collection Time: 11/14/2019  6:57 PM   Specimen: Nasopharyngeal Swab  Result Value Ref Range Status   SARS Coronavirus 2 POSITIVE (A) NEGATIVE Final    Comment: RESULT CALLED TO, READ BACK BY AND VERIFIED WITH: S.ADAMSON RN 1857 11/06/2019 MCCORMICK K (NOTE) SARS-CoV-2 target nucleic acids are DETECTED. The SARS-CoV-2 RNA is  generally detectable in upper and lower respiratory specimens during the acute phase of infection. Positive results are indicative of active infection with SARS-CoV-2. Clinical  correlation with patient history and other diagnostic information is necessary to determine patient infection status. Positive results do  not rule out bacterial infection or co-infection with other viruses. The expected result is Negative. Fact Sheet for Patients: SugarRoll.be Fact Sheet for Healthcare Providers: https://www.woods-mathews.com/ This test is not yet approved  or cleared by the [place] and  has been authorized for detection and/or diagnosis of SARS-CoV-2 by FDA under an Emergency Use Authorization (EUA). This EUA will remain  in effect (meaning this test can be used)  for the duration of the COVID-19 declaration under Section 564(b)(1) of the Act, 21 U.S.C. section 360bbb-3(b)(1), unless the authorization is terminated or revoked sooner. Performed at [facility], [place] [address], [place], [place] [address]   Blood Culture (routine x 2)     Status: None (Preliminary result)   Collection Time: 10/25/2019  6:57 PM   Specimen: Left Antecubital; Blood  Result Value Ref Range Status   Specimen Description LEFT ANTECUBITAL  Final   Special Requests   Final    BOTTLES DRAWN AEROBIC AND ANAEROBIC Blood Culture adequate volume   Culture   Final    NO GROWTH < 12 HOURS Performed at [facility], [address] [place], [place] [address]    Report Status PENDING  Incomplete  Blood Culture (routine x 2)     Status: None (Preliminary result)   Collection Time: 11/02/2019  7:08 PM   Specimen: BLOOD  Result Value Ref Range Status   Specimen Description BLOOD LEFT ANTECUBITAL  Final   Special Requests   Final    BOTTLES DRAWN AEROBIC AND ANAEROBIC Blood Culture adequate volume   Culture   Final    NO GROWTH < 12 HOURS Performed at [facility],  [address], [place], [place] [address]    Report Status PENDING  Incomplete         Radiology Studies: Dg Chest Port 1 View  Result Date: 10/25/2019 CLINICAL DATA:  Shortness of breath and fever for several days, history of COVID-19 exposure EXAM: PORTABLE CHEST 1 VIEW COMPARISON:  01/30/2018 FINDINGS: Cardiac shadow is mildly prominent but stable. Postsurgical changes are again seen. The lungs are well aerated bilaterally with patchy bibasilar airspace opacities. Some patchy opacities are noted in the left upper lobe as well. IMPRESSION: Patchy bilateral airspace opacities. Given the recent exposure COVID-19 is a distinct possibility. Correlate with laboratory testing. Electronically Signed   By: Inez Catalina M.D.   On: 10/29/2019 19:38        Scheduled Meds: . amLODipine  10 mg Oral Daily  . aspirin EC  81 mg Oral Daily  . atorvastatin  20 mg Oral q1800  . cloNIDine  0.2 mg Oral BID  . dexamethasone (DECADRON) injection  6 mg Intravenous Q0600  . ferrous sulfate  325 mg Oral Daily  . [START ON 11/07/2019] heparin injection (subcutaneous)  5,000 Units Subcutaneous Q8H  . insulin aspart  0-9 Units Subcutaneous TID WC  . insulin glargine  20 Units Subcutaneous QHS  . mouth rinse  15 mL Mouth Rinse BID   Continuous Infusions: . sodium chloride Stopped (11/06/19 0434)  . remdesivir 200 mg in NS 250 mL     Followed by  . [START ON 11/07/2019] remdesivir 100 mg in NS 250 mL       LOS: 1 day    Time spent: 25 minutes with over 50% of the time coordinating the patient's care    Harold Hedge, DO [facility] Pager [phone]  If 7PM-7AM, please contact night-coverage [email or web address] Password [facility] 11/06/2019, 9:28 PM

## 2019-11-06 NOTE — Progress Notes (Signed)
CRITICAL VALUE ALERT  Critical Value:  COVID +  Date & Time Notied:  11/06/19 1900  Provider Notified: Neysa Bonito  Orders Received/Actions taken: See new orders

## 2019-11-06 NOTE — TOC Progression Note (Signed)
Transition of Care [facility]) - Progression Note    Patient Details  Name: Stephanie Hendricks MRN: [id number] Date of Birth: 16-Feb-1941  Transition of [facility] CM/SW Contact  Purcell Mouton, RN Phone Number: 11/06/2019, 2:08 PM  Clinical Narrative:    Spoke with pt who lives with her son. Pt states that she is able to purchase her medications.Pt would benefit with HHRN/PT/OT/NA. Will continue to follow.    Expected Discharge Plan: [place] Barriers to Discharge: No Barriers Identified  Expected Discharge Plan and Services Expected Discharge Plan: [place]   Discharge Planning Services: CM Consult   Living arrangements for the past 2 months: Single Family Home                                       Social Determinants of Health (SDOH) Interventions    Readmission Risk Interventions No flowsheet data found.

## 2019-11-07 ENCOUNTER — Encounter (HOSPITAL_COMMUNITY): Payer: Self-pay

## 2019-11-07 ENCOUNTER — Inpatient Hospital Stay (HOSPITAL_COMMUNITY): Payer: Medicare Other

## 2019-11-07 DIAGNOSIS — J9601 Acute respiratory failure with hypoxia: Secondary | ICD-10-CM

## 2019-11-07 DIAGNOSIS — I10 Essential (primary) hypertension: Secondary | ICD-10-CM

## 2019-11-07 DIAGNOSIS — U071 COVID-19: Principal | ICD-10-CM

## 2019-11-07 DIAGNOSIS — J1289 Other viral pneumonia: Secondary | ICD-10-CM

## 2019-11-07 DIAGNOSIS — J1282 Pneumonia due to coronavirus disease 2019: Secondary | ICD-10-CM | POA: Diagnosis present

## 2019-11-07 LAB — C-REACTIVE PROTEIN: CRP: 15.9 mg/dL — ABNORMAL HIGH (ref <1.0)

## 2019-11-07 LAB — COMPREHENSIVE METABOLIC PANEL
ALT: 26 U/L (ref 0–44)
AST: 48 U/L — ABNORMAL HIGH (ref 15–41)
Albumin: 3.1 g/dL — ABNORMAL LOW (ref 3.5–5.0)
Alkaline Phosphatase: 87 U/L (ref 38–126)
Anion gap: 14 (ref 5–15)
BUN: 33 mg/dL — ABNORMAL HIGH (ref 8–23)
CO2: 27 mmol/L (ref 22–32)
Calcium: 8.8 mg/dL — ABNORMAL LOW (ref 8.9–10.3)
Chloride: 104 mmol/L (ref 98–111)
Creatinine, Ser: 1.55 mg/dL — ABNORMAL HIGH (ref 0.44–1.00)
GFR calc Af Amer: 36 mL/min — ABNORMAL LOW (ref >60)
GFR calc non Af Amer: 31 mL/min — ABNORMAL LOW (ref >60)
Glucose, Bld: 207 mg/dL — ABNORMAL HIGH (ref 70–99)
Potassium: 4.8 mmol/L (ref 3.5–5.1)
Sodium: 145 mmol/L (ref 135–145)
Total Bilirubin: 0.8 mg/dL (ref 0.3–1.2)
Total Protein: 7.5 g/dL (ref 6.5–8.1)

## 2019-11-07 LAB — TYPE AND SCREEN
ABO/RH(D): B POS
Antibody Screen: NEGATIVE

## 2019-11-07 LAB — CBC WITH DIFFERENTIAL/PLATELET
Abs Immature Granulocytes: 0.04 10*3/uL (ref 0.00–0.07)
Basophils Absolute: 0 10*3/uL (ref 0.0–0.1)
Basophils Relative: 0 %
Eosinophils Absolute: 0 10*3/uL (ref 0.0–0.5)
Eosinophils Relative: 0 %
HCT: 41.5 % (ref 36.0–46.0)
Hemoglobin: 12 g/dL (ref 12.0–15.0)
Immature Granulocytes: 1 %
Lymphocytes Relative: 29 %
Lymphs Abs: 2.3 10*3/uL (ref 0.7–4.0)
MCH: 24 pg — ABNORMAL LOW (ref 26.0–34.0)
MCHC: 28.9 g/dL — ABNORMAL LOW (ref 30.0–36.0)
MCV: 83.2 fL (ref 80.0–100.0)
Monocytes Absolute: 0.4 10*3/uL (ref 0.1–1.0)
Monocytes Relative: 5 %
Neutro Abs: 5.1 10*3/uL (ref 1.7–7.7)
Neutrophils Relative %: 65 %
Platelets: 176 10*3/uL (ref 150–400)
RBC: 4.99 MIL/uL (ref 3.87–5.11)
RDW: 13.7 % (ref 11.5–15.5)
WBC: 7.8 10*3/uL (ref 4.0–10.5)
nRBC: 0.3 % — ABNORMAL HIGH (ref 0.0–0.2)

## 2019-11-07 LAB — GLUCOSE, CAPILLARY
Glucose-Capillary: 173 mg/dL — ABNORMAL HIGH (ref 70–99)
Glucose-Capillary: 242 mg/dL — ABNORMAL HIGH (ref 70–99)
Glucose-Capillary: 336 mg/dL — ABNORMAL HIGH (ref 70–99)
Glucose-Capillary: 252 mg/dL — ABNORMAL HIGH (ref 70–99)

## 2019-11-07 LAB — D-DIMER, QUANTITATIVE: D-Dimer, Quant: 0.69 ug/mL-FEU — ABNORMAL HIGH (ref 0.00–0.50)

## 2019-11-07 MED ORDER — SODIUM CHLORIDE 0.9 % IV SOLN
100.0000 mg | Freq: Once | INTRAVENOUS | Status: AC
  Administered 2019-11-07: 100 mg via INTRAVENOUS
  Filled 2019-11-07: qty 100

## 2019-11-07 MED ORDER — METHYLPREDNISOLONE SODIUM SUCC 40 MG IJ SOLR
40.0000 mg | Freq: Two times a day (BID) | INTRAMUSCULAR | Status: DC
  Administered 2019-11-07 – 2019-11-16 (×18): 40 mg via INTRAVENOUS
  Filled 2019-11-07 (×18): qty 1

## 2019-11-07 MED ORDER — INSULIN GLARGINE 100 UNIT/ML ~~LOC~~ SOLN
28.0000 [IU] | Freq: Every day | SUBCUTANEOUS | Status: DC
  Administered 2019-11-07: 28 [IU] via SUBCUTANEOUS
  Filled 2019-11-07 (×3): qty 0.28

## 2019-11-07 MED ORDER — HYDROCOD POLST-CPM POLST ER 10-8 MG/5ML PO SUER
5.0000 mL | Freq: Two times a day (BID) | ORAL | Status: DC | PRN
  Administered 2019-11-07: 5 mL via ORAL
  Filled 2019-11-07: qty 5

## 2019-11-07 MED ORDER — METOPROLOL TARTRATE 25 MG PO TABS
12.5000 mg | ORAL_TABLET | Freq: Two times a day (BID) | ORAL | Status: DC
  Administered 2019-11-07 – 2019-11-08 (×3): 12.5 mg via ORAL
  Filled 2019-11-07 (×3): qty 1

## 2019-11-07 MED ORDER — CLONIDINE HCL 0.1 MG PO TABS
0.2000 mg | ORAL_TABLET | Freq: Three times a day (TID) | ORAL | Status: DC
  Administered 2019-11-07 – 2019-11-08 (×2): 0.2 mg via ORAL
  Filled 2019-11-07 (×2): qty 2

## 2019-11-07 MED ORDER — ENSURE ENLIVE PO LIQD
237.0000 mL | Freq: Two times a day (BID) | ORAL | Status: DC
  Administered 2019-11-08: 237 mL via ORAL
  Filled 2019-11-07 (×5): qty 237

## 2019-11-07 MED ORDER — SODIUM CHLORIDE 0.9 % IV SOLN
100.0000 mg | INTRAVENOUS | Status: AC
  Administered 2019-11-08 – 2019-11-10 (×3): 100 mg via INTRAVENOUS
  Filled 2019-11-07 (×3): qty 100

## 2019-11-07 MED ORDER — SODIUM CHLORIDE 0.9% IV SOLUTION: Freq: Once | INTRAVENOUS | Status: DC

## 2019-11-07 MED ORDER — CHLORHEXIDINE GLUCONATE CLOTH 2 % EX PADS
6.0000 | MEDICATED_PAD | Freq: Every day | CUTANEOUS | Status: DC
  Administered 2019-11-07 – 2019-11-18 (×14): 6 via TOPICAL

## 2019-11-07 MED ORDER — INSULIN ASPART 100 UNIT/ML ~~LOC~~ SOLN
4.0000 [IU] | Freq: Three times a day (TID) | SUBCUTANEOUS | Status: DC
  Administered 2019-11-08 (×2): 4 [IU] via SUBCUTANEOUS

## 2019-11-07 MED ORDER — ALBUTEROL SULFATE HFA 108 (90 BASE) MCG/ACT IN AERS
2.0000 | INHALATION_SPRAY | RESPIRATORY_TRACT | Status: DC | PRN
  Filled 2019-11-07: qty 6.7

## 2019-11-07 MED ORDER — METHYLPREDNISOLONE SODIUM SUCC 40 MG IJ SOLR: 40.0000 mg | Freq: Two times a day (BID) | INTRAMUSCULAR | Status: DC

## 2019-11-07 MED ORDER — BENZONATATE 100 MG PO CAPS
200.0000 mg | ORAL_CAPSULE | Freq: Three times a day (TID) | ORAL | Status: DC
  Administered 2019-11-07 – 2019-11-08 (×2): 200 mg via ORAL
  Filled 2019-11-07 (×2): qty 2

## 2019-11-07 MED ORDER — FUROSEMIDE 10 MG/ML IJ SOLN
80.0000 mg | Freq: Once | INTRAMUSCULAR | Status: AC
  Administered 2019-11-07: 80 mg via INTRAVENOUS
  Filled 2019-11-07: qty 8

## 2019-11-07 MED ORDER — DIPHENHYDRAMINE HCL 50 MG/ML IJ SOLN: 25.0000 mg | Freq: Once | INTRAMUSCULAR | Status: DC

## 2019-11-07 MED ORDER — ACETAMINOPHEN 325 MG PO TABS: 650.0000 mg | ORAL_TABLET | Freq: Once | ORAL | Status: DC

## 2019-11-07 MED ORDER — INSULIN ASPART 100 UNIT/ML ~~LOC~~ SOLN
0.0000 [IU] | Freq: Three times a day (TID) | SUBCUTANEOUS | Status: DC
  Administered 2019-11-07: 15 [IU] via SUBCUTANEOUS
  Administered 2019-11-08: 4 [IU] via SUBCUTANEOUS
  Administered 2019-11-08: 11 [IU] via SUBCUTANEOUS

## 2019-11-07 MED ORDER — HEPARIN SODIUM (PORCINE) 10000 UNIT/ML IJ SOLN
7500.0000 [IU] | Freq: Three times a day (TID) | INTRAMUSCULAR | Status: DC
  Administered 2019-11-07 – 2019-11-17 (×26): 7500 [IU] via SUBCUTANEOUS
  Filled 2019-11-07 (×27): qty 1

## 2019-11-07 NOTE — Progress Notes (Signed)
Removed 2 of patients rings and placed them in a labeled specimen cup and they are now in her patient belonging bag. 1 ring remains on pt right hand due to being unable to remove.

## 2019-11-07 NOTE — Progress Notes (Addendum)
PROGRESS NOTE    Stephanie Hendricks    Code Status: Full Code  [id number] DOB: [birth year] DOA: 11/21/2019  PCP: Celene Squibb, MD    Hospital Summary  Stephanie Hendricks  is a 78 y.o. female, with history of CAD s/p CABG x3, CKD stage III, diabetes mellitus type 2, essential hypertension, hyperlipidemia, chronic diastolic CHF, paroxysmal atrial fibrillation postop CABG came to hospital with complaints of shortness of breath and fever.  Patient son was positive for Covid.  He has been sick for several days.  Patient says that she has been feeling weak with shortness of breath for past 3 days.  She does not use oxygen at home.  In the ED she was found to be hypoxic with O2 sat 72% on room air.  She was placed on 4 L of nasal cannula, O2 sats came up to mid 90s.  She was also found to be febrile with temperature 103 F. In the ED, lab work showed LDH 370, ferritin 3642, CRP 13, lactic acid 1.0, procalcitonin 0.33, fibrinogen 572, D-dimer 0.86.  Covid +11/13 To be transferred to [facility]   Active Problems:   Viral pneumonia   Acute hypoxic respiratory failure secondary to COVID-19 satting in mid 80s on 15 L nasal cannula.  Afebrile this AM. -Decadron 6 mg daily -Remdesivir -Titrate O2 to greater than 90% -Transfer to [facility] when bed available  Addendum: This afternoon O2 requirement worsening, patient remains comfortable not in respiratory distress but being placed on 15L nonrebreather mask saturating 90%. Discussed with Dr. Melvyn Novas, who felt PE is unlikely and so VQ scan was cancelled. Discussed Actemra with patient who after discussion declines this therapy at this time. Patient is being transferred to [facility] shortly, would consider discussing Actemra again with her.  Chronic diastolic heart failure, compensated Lasix held on admission due to worsening renal function.  Creatinine 1.62-> 1.69 --> 1.55 -hold Lasix  Hypertension stable -Continue home Norvasc and Catapres  Type 2 diabetes HA1C 8.7  monitor glucose while on Decadron -Continue sliding scale  CAD status post CABG -Continue statin, aspirin   DVT prophylaxis: Lovenox Diet: Carb modified Family Communication: No family at bedside Disposition Plan: Transfer to [facility]  Consultants  None  Procedures  none  Antibiotics  Remdesivir      Subjective   Patient seen and examined at bedside in no acute distress and resting comfortably but now on 15L high flow O2.  Objective   Vitals:   11/06/19 2307 11/07/19 0100 11/07/19 0715 11/07/19 0800  BP: (!) 127/52  (!) 149/62 128/80  Pulse:   75 79  Resp:   18   Temp:   98.6 F (37 C)   TempSrc:   Oral   SpO2: 91% 95% (!) 87% (!) 86%  Weight:      Height:        Intake/Output Summary (Last 24 hours) at 11/07/2019 0817 Last data filed at 11/06/2019 1633 Gross per 24 hour  Intake 320 ml  Output --  Net 320 ml   Filed Weights   10/27/2019 1816 11/06/19 0430  Weight: 86.2 kg 83.4 kg    Examination:  Physical Exam Vitals signs and nursing note reviewed.  Constitutional:      General: She is not in acute distress.    Appearance: Normal appearance.  HENT:     Head: Normocephalic and atraumatic.     Comments: Nasal canula    Nose: Nose normal.     Mouth/Throat:  Mouth: Mucous membranes are moist.  Eyes:     Extraocular Movements: Extraocular movements intact.  Neck:     Musculoskeletal: No neck rigidity.  Cardiovascular:     Rate and Rhythm: Normal rate and regular rhythm.  Pulmonary:     Effort: Pulmonary effort is normal. No respiratory distress.     Breath sounds: Normal breath sounds.  Abdominal:     General: Abdomen is flat.     Palpations: Abdomen is soft.  Musculoskeletal: Normal range of motion.        General: No swelling.  Neurological:     General: No focal deficit present.     Mental Status: She is alert. Mental status is at baseline.  Psychiatric:        Mood and Affect: Mood normal.        Behavior: Behavior normal.      Data Reviewed: I have personally reviewed following labs and imaging studies  CBC: Recent Labs  Lab 10/27/2019 1857 11/06/19 0514 11/07/19 0413  WBC 6.7 5.8 7.8  NEUTROABS 3.6 3.3 5.1  HGB 11.9* 10.8* 12.0  HCT 40.4 36.9 41.5  MCV 81.3 82.4 83.2  PLT 197 166 0000000   Basic Metabolic Panel: Recent Labs  Lab 11/11/2019 1857 11/06/19 0514 11/07/19 0413  NA 141 142 145  K 3.7 3.6 4.8  CL 104 101 104  CO2 29 31 27   GLUCOSE 181* 154* 207*  BUN 20 24* 33*  CREATININE 1.62* 1.69* 1.55*  CALCIUM 7.7* 7.8* 8.8*   GFR: Estimated Creatinine Clearance: 29 mL/min (A) (by C-G formula based on SCr of 1.55 mg/dL (H)). Liver Function Tests: Recent Labs  Lab 11/11/2019 1857 11/06/19 0514 11/07/19 0413  AST 42* 42* 48*  ALT 26 24 26   ALKPHOS 82 68 87  BILITOT 0.9 1.0 0.8  PROT 7.7 7.2 7.5  ALBUMIN 3.5 3.2* 3.1*   No results for input(s): LIPASE, AMYLASE in the last 168 hours. No results for input(s): AMMONIA in the last 168 hours. Coagulation Profile: No results for input(s): INR, PROTIME in the last 168 hours. Cardiac Enzymes: No results for input(s): CKTOTAL, CKMB, CKMBINDEX, TROPONINI in the last 168 hours. BNP (last 3 results) No results for input(s): PROBNP in the last 8760 hours. HbA1C: Recent Labs    11/06/19 0514  HGBA1C 8.7*   CBG: Recent Labs  Lab 11/06/19 0932 11/06/19 1313 11/06/19 1621 11/06/19 2241 11/07/19 0725  GLUCAP 234* 336* 270* 197* 173*   Lipid Profile: Recent Labs    11/23/2019 1921  TRIG 85   Thyroid Function Tests: No results for input(s): TSH, T4TOTAL, FREET4, T3FREE, THYROIDAB in the last 72 hours. Anemia Panel: Recent Labs    11/12/2019 1921  FERRITIN 3,642*   Sepsis Labs: Recent Labs  Lab 11/09/2019 1857 11/08/2019 2124 11/06/19 0817 11/06/19 1120  PROCALCITON 0.33  --   --   --   LATICACIDVEN 1.2 1.0 1.0 1.2    Recent Results (from the past 240 hour(s))  SARS CORONAVIRUS 2 (TAT 6-24 HRS) Nasopharyngeal Nasopharyngeal Swab      Status: Abnormal   Collection Time: 11/08/2019  6:57 PM   Specimen: Nasopharyngeal Swab  Result Value Ref Range Status   SARS Coronavirus 2 POSITIVE (A) NEGATIVE Final    Comment: RESULT CALLED TO, READ BACK BY AND VERIFIED WITH: S.ADAMSON RN 1857 11/06/2019 MCCORMICK K (NOTE) SARS-CoV-2 target nucleic acids are DETECTED. The SARS-CoV-2 RNA is generally detectable in upper and lower respiratory specimens during the acute phase of infection. Positive  results are indicative of active infection with SARS-CoV-2. Clinical  correlation with patient history and other diagnostic information is necessary to determine patient infection status. Positive results do  not rule out bacterial infection or co-infection with other viruses. The expected result is Negative. Fact Sheet for Patients: SugarRoll.be Fact Sheet for Healthcare Providers: https://www.woods-mathews.com/ This test is not yet approved or cleared by the [place] FDA and  has been authorized for detection and/or diagnosis of SARS-CoV-2 by FDA under an Emergency Use Authorization (EUA). This EUA will remain  in effect (meaning this test can be used)  for the duration of the COVID-19 declaration under Section 564(b)(1) of the Act, 21 U.S.C. section 360bbb-3(b)(1), unless the authorization is terminated or revoked sooner. Performed at [facility], [place] [address], [place], [place] [address]   Blood Culture (routine x 2)     Status: None (Preliminary result)   Collection Time: 11/23/2019  6:57 PM   Specimen: Left Antecubital; Blood  Result Value Ref Range Status   Specimen Description LEFT ANTECUBITAL  Final   Special Requests   Final    BOTTLES DRAWN AEROBIC AND ANAEROBIC Blood Culture adequate volume   Culture   Final    NO GROWTH 2 DAYS Performed at [facility], [address], [place], [place] [address]    Report Status PENDING  Incomplete  Blood Culture (routine x 2)      Status: None (Preliminary result)   Collection Time: 10/26/2019  7:08 PM   Specimen: BLOOD  Result Value Ref Range Status   Specimen Description BLOOD LEFT ANTECUBITAL  Final   Special Requests   Final    BOTTLES DRAWN AEROBIC AND ANAEROBIC Blood Culture adequate volume   Culture   Final    NO GROWTH 2 DAYS Performed at [facility], [address] [place], [place] [address]    Report Status PENDING  Incomplete         Radiology Studies: Dg Chest Port 1 View  Result Date: 11/17/2019 CLINICAL DATA:  Shortness of breath and fever for several days, history of COVID-19 exposure EXAM: PORTABLE CHEST 1 VIEW COMPARISON:  01/30/2018 FINDINGS: Cardiac shadow is mildly prominent but stable. Postsurgical changes are again seen. The lungs are well aerated bilaterally with patchy bibasilar airspace opacities. Some patchy opacities are noted in the left upper lobe as well. IMPRESSION: Patchy bilateral airspace opacities. Given the recent exposure COVID-19 is a distinct possibility. Correlate with laboratory testing. Electronically Signed   By: Inez Catalina M.D.   On: 11/22/2019 19:38        Scheduled Meds:  amLODipine  10 mg Oral Daily   aspirin EC  81 mg Oral Daily   atorvastatin  20 mg Oral q1800   cloNIDine  0.2 mg Oral BID   dexamethasone (DECADRON) injection  6 mg Intravenous Q0600   ferrous sulfate  325 mg Oral Daily   heparin injection (subcutaneous)  5,000 Units Subcutaneous Q8H   insulin aspart  0-9 Units Subcutaneous TID WC   insulin glargine  20 Units Subcutaneous QHS   mouth rinse  15 mL Mouth Rinse BID   Continuous Infusions:  sodium chloride Stopped (11/06/19 0434)   remdesivir 100 mg in NS 250 mL       LOS: 2 days    Time spent: 28minutes with over 50% of the time coordinating the patient's care    Ramsha Lonigro Marry Guan, MD [facility] Pager [phone]  If 7PM-7AM, please contact night-coverage [email or web address] Password [facility] 11/07/2019,  8:17 AM

## 2019-11-07 NOTE — Consult Note (Addendum)
PULMONARY / CRITICAL CARE MEDICINE   NAME:  Stephanie Hendricks, MRN:  [id number], DOB:  March 29, 1941, LOS: 2 ADMISSION DATE:  11/10/2019, CONSULTATION DATE:  11/07/19 REFERRING MD:  [facility]   CHIEF COMPLAINT:  Sob   BRIEF HISTORY:    9 yobf never smoker with h/o IHD s/p cabg/ CRI stage III, DMII hbp and diastolic dysfunction admitted 11/12 with fever and sob x 3 d pta  p exp to covid with sats 72% on RA and Temp 103/ covid POS and rx with dex/ remdesivir and initally 3lpm NP but by noon 11/14 requred NRM with increased wob and PPCM consulted awaiting possible tx  to [facility]  In the ED, lab work showed LDH 370, ferritin 3642, CRP 13, lactic acid 1.0, procalcitonin 0.33, fibrinogen 572, D-dimer 0.86.  HISTORY OF PRESENT ILLNESS   See above - prior to acute illness had been ambulatory but sedentary s need for 02 or inhalers but progressive sob x 3 d PTA and now sob at rest  No obvious day to day or daytime variability or assoc excess/ purulent sputum or mucus plugs or hemoptysis or cp or chest tightness, subjective wheeze or overt sinus or hb symptoms.   Sleeping without nocturnal  or early am exacerbation  of respiratory  c/o's or need for noct saba. Also denies any obvious fluctuation of symptoms with weather or environmental changes or other aggravating or alleviating factors except as outlined above   No unusual exposure hx or h/o childhood pna/ asthma or knowledge of premature birth.  Current Allergies, Complete Past Medical History, Past Surgical History, Family History, and Social History were reviewed in [employer] record.  ROS  The following are not active complaints unless bolded Hoarseness, sore throat, dysphagia, dental problems, itching, sneezing,  nasal congestion or discharge of excess mucus or purulent secretions, ear ache,   fever, chills, sweats, unintended wt loss or wt gain, classically pleuritic or exertional cp,  orthopnea pnd or arm/hand swelling  or leg  swelling, presyncope, palpitations, abdominal pain, anorexia, nausea, vomiting, diarrhea  or change in bowel habits or change in bladder habits, change in stools or change in urine, dysuria, hematuria,  rash, arthralgias, visual complaints, headache, numbness, weakness or ataxia or problems with walking or coordination,  change in mood or  memory.          SIGNIFICANT PAST MEDICAL HISTORY    see pt profile  SIGNIFICANT EVENTS:  Started Remdesivir 11/13>>> Dex IV  11/13 >>>  STUDIES:    n the ED, lab work showed LDH 370, ferritin 3642, CRP 13, lactic acid 1.0, procalcitonin 0.33, fibrinogen 572, D-dimer 0.86.   CULTURES:  BC x 2 >>> SARS Coronavirus PCR  11/12 POS   ANTIBIOTICS:    LINES/TUBES:    CONSULTANTS:  PCCM 11/14   SUBJECTIVE:  Mod sob at rest/ no fever since 11/12 pm   CONSTITUTIONAL: BP (!) 163/72 (BP Location: Right Arm)   Pulse 78   Temp 99.7 F (37.6 C) (Oral)   Resp 18   Ht 5\' 2"  (1.575 m)   Wt 83.4 kg   SpO2 (!) 82%   BMI 33.63 kg/m   Sats on my exam 93% on NRM with no flaps   I/O last 3 completed shifts: In: 320 [P.O.:320] Out: -         PHYSICAL EXAM: General:  Elderly bf hob up 30 degrees/ mod increaed wob at rest  Neuro:  Alert/ approp/ moves all 4 HEENT :  NRM not removed for exam, stick out tongue nl  Cardiovascular:  RRR no s3  Lungs:  Diffuse but min insp rhonchi s wheeze  Abdomen:  Soft/ benign Musculoskeletal:  Warm s cyanosis clubbing or edema  Skin:  No rash, breakdown  pCXR 11/12 Patchy bilateral airspace opacities.  RESOLVED PROBLEM LIST   ASSESSMENT AND PLAN     1) Acute resp distress/ hypoxemia in pt with documented covid19 pcr c/w progressive viral pnuemonitis and high risk for progressing to vent dep given how rapidly she's declined over 24 h despite defervescence on dex /remdesivir IV  Rec:  Adjust  02 adding high flow if needed to keep sats > 90% but as long as not in immediate resp extremis or agonal want to  avoid intubation since prognosis in this group is so poor  >>> prone pt as tolerateed if needs more 02 than presently receiving (probably really on 60% 02 now since flaps not on mask. >>> no evidence of PE clinically, on Hep sq and can't do CTa due to kidneys so Q scan ordered by [facility] likely to be non-diagnostic and optional at this point from my perspective - no available to do this at present and will delay transfer so I cancelled it  >>> rec transfer to progressive [facility] at [facility] when bed available   2) HBP  slt elevated and norvasc not an ideal choice as may interfere with v/a matching synergistically with covid  > use lopressor/ clonidine instead    SUMMARY OF TODAY'S PLAN:  montior 02, prone if necessary   Best Practice / Goals of Care / Disposition.   DVT PROPHYLAXIS: hep sq SUP: ppi NUTRITION: diet for now  MOBILITY:bed rest GOALS OF CARE:stabilize 02 requirements and if can't do so escalate with high flow 02  FAMILY DISCUSSIONS: n/a  DISPOSITION to [facility] when bed available   LABS  Glucose Recent Labs  Lab 11/06/19 0932 11/06/19 1313 11/06/19 1621 11/06/19 2241 11/07/19 0725 11/07/19 1140  GLUCAP 234* 336* 270* 197* 173* 242*    BMET Recent Labs  Lab 10/29/2019 1857 11/06/19 0514 11/07/19 0413  NA 141 142 145  K 3.7 3.6 4.8  CL 104 101 104  CO2 29 31 27   BUN 20 24* 33*  CREATININE 1.62* 1.69* 1.55*  GLUCOSE 181* 154* 207*    Liver Enzymes Recent Labs  Lab 11/23/2019 1857 11/06/19 0514 11/07/19 0413  AST 42* 42* 48*  ALT 26 24 26   ALKPHOS 82 68 87  BILITOT 0.9 1.0 0.8  ALBUMIN 3.5 3.2* 3.1*    Electrolytes Recent Labs  Lab 10/30/2019 1857 11/06/19 0514 11/07/19 0413  CALCIUM 7.7* 7.8* 8.8*    CBC Recent Labs  Lab 10/28/2019 1857 11/06/19 0514 11/07/19 0413  WBC 6.7 5.8 7.8  HGB 11.9* 10.8* 12.0  HCT 40.4 36.9 41.5  PLT 197 166 176    ABG No results for input(s): PHART, PCO2ART, PO2ART in the last 168 hours.  Coag's No results for  input(s): APTT, INR in the last 168 hours.  Sepsis Markers Recent Labs  Lab 11/12/2019 1857 11/14/2019 2124 11/06/19 0817 11/06/19 1120  LATICACIDVEN 1.2 1.0 1.0 1.2  PROCALCITON 0.33  --   --   --     Cardiac Enzymes No results for input(s): TROPONINI, PROBNP in the last 168 hours.  PAST MEDICAL HISTORY :   She  has a past medical history of Aortic stenosis, Bradycardia, CAD (coronary artery disease), Chronic kidney disease, stage III (moderate), Degenerative joint disease,  Diabetes mellitus type II, Essential hypertension, benign, Hyperlipidemia, Lower extremity edema, Microcytic anemia, NSTEMI (non-ST elevated myocardial infarction) ([place]) (01/2017), Palpitations, and Postoperative atrial fibrillation ([place]).  PAST SURGICAL HISTORY:  She  has a past surgical history that includes Tubal ligation; Hemorroidectomy; Tonsillectomy; Colonoscopy w/ polypectomy (2005); Esophagogastroduodenoscopy; Colonoscopy (08/28/2011); Esophagogastroduodenoscopy (08/28/2011); LEFT HEART CATH AND CORONARY ANGIOGRAPHY (N/A, 02/16/2017); and Coronary artery bypass graft (N/A, 02/16/2017).  No Known Allergies  No current facility-administered medications on file prior to encounter.    Current Outpatient Medications on File Prior to Encounter  Medication Sig  . acetaminophen (TYLENOL) 325 MG tablet Take 2 tablets (650 mg total) by mouth every 6 (six) hours as needed for mild pain.  Marland Kitchen amLODipine (NORVASC) 10 MG tablet Take 1 tablet (10 mg total) by mouth daily.  Marland Kitchen aspirin 81 MG tablet Take 81 mg by mouth daily.    Marland Kitchen atorvastatin (LIPITOR) 20 MG tablet Take 1 tablet by mouth daily.  . cloNIDine (CATAPRES) 0.2 MG tablet TAKE (1) TABLET BY MOUTH TWICE DAILY. (Patient taking differently: Take 0.2 mg by mouth 2 (two) times daily. )  . ferrous sulfate 325 (65 FE) MG tablet Take 1 tablet by mouth daily.  . furosemide (LASIX) 40 MG tablet Take 1 tablet by mouth 2 (two) times daily.  . potassium chloride SA (K-DUR,KLOR-CON) 20  MEQ tablet Take 2 tablets (40 mEq total) by mouth daily.  Claris Che 100-3.6 UNIT-MG/ML SOPN Inject 38 Units into the skin daily.     FAMILY HISTORY:   Her family history includes Coronary artery disease in her sister; Heart attack in her brother; Hypertension in her father. There is no history of Colon cancer or Liver disease.  SOCIAL HISTORY:  She  reports that she has never smoked. She has never used smokeless tobacco. She reports that she does not drink alcohol or use drugs.     The patient is critically ill with multiple organ systems failure and requires high complexity decision making for assessment and support, frequent evaluation and titration of therapies, application of advanced monitoring technologies and extensive interpretation of multiple databases. Critical Care Time devoted to patient care services described in this note is 45 minutes.    Christinia Gully, MD Pulmonary and [place] [phone] After 5:30 PM or weekends, use [facility] [phone]

## 2019-11-07 NOTE — Progress Notes (Signed)
Multiple family members have called to get an update on patient. Informed them that they must contact patient's son as he is the main contact person listed in chart. They stated they understood. I gave pt the iphone and allowed her to dial her family members and talk with them. Still unable to reach pt's son at this time.

## 2019-11-07 NOTE — Progress Notes (Addendum)
Brief transfer note:   Seen earlier today by Dr. Hollice Gong at [facility].  78 year old female-with history of DM-2, CKD stage III, HTN, CAD s/p CABG, history of macular degeneration-presented to the hospital with 3-day history of shortness of breath and fever.  Found to have acute hypoxic respiratory failure secondary to COVID-19 pneumonia-initially requiring 2-3 L of oxygen, but over the past 24 hours has had rapidly worsening hypoxemia-now on 15 L high of high flow O2-approximately 35% FiO2.  Transferred to the ICU here at [facility].  Exam: Very talkative-easily completing full sentences JVD appears to be full Chest: Clear to auscultation anteriorly Legs: No edema  Assessment/plan Acute hypoxic respiratory failure secondary to COVID-19 pneumonia: Appears very comfortable-not in any distress-continue to titrate FiO2 to keep O2 saturation above 87-88%.  Continue steroids and remdesivir.  Patient consents to 1 unit of Covid convalescent plasma-have explained rationale, risks/benefits.    Dr. Hollice Gong at [facility]-went over rationale, risks, benefits of off label Actemra use-patient declined-I subsequently went over as well-patient continues to decline-wants to try convalescent plasma first.    Continue supportive care-monitor closely in the ICU-if develops worsening hypoxemia or respiratory distress-we will need PCCM consultation for intubation.  Chronic diastolic heart failure: Although no overt signs of volume overload-JVD appears to be full-check BNP-we will give IV Lasix 80 mg x 1.  Will attempt to keep in negative balance.  Follow weights/electrolytes and volume status.  DM-2: Expect hyperglycemia as patient on steroids-have increased the Lantus to 28 units daily, 4 units of NovoLog with meals and change SSI to resistant scale.  HTN: Continue Lopressor and Catapres.  Dyslipidemia: Continue statin  CAD s/p CABG: No anginal symptoms   Updated patient's son over the  phone

## 2019-11-07 NOTE — Progress Notes (Addendum)
O2 advanced to 15L HFNC due to sats barely 90% at 10L HFNC. [place], NP and RT aware of increased O2 needs. NAD otherwise noted. Will continue to monitor.

## 2019-11-08 ENCOUNTER — Inpatient Hospital Stay (HOSPITAL_COMMUNITY): Payer: Medicare Other

## 2019-11-08 DIAGNOSIS — J80 Acute respiratory distress syndrome: Secondary | ICD-10-CM

## 2019-11-08 DIAGNOSIS — U071 COVID-19: Secondary | ICD-10-CM

## 2019-11-08 LAB — POCT I-STAT 7, (LYTES, BLD GAS, ICA,H+H)
Acid-Base Excess: 6 mmol/L — ABNORMAL HIGH (ref 0.0–2.0)
Acid-Base Excess: 8 mmol/L — ABNORMAL HIGH (ref 0.0–2.0)
Bicarbonate: 34.2 mmol/L — ABNORMAL HIGH (ref 20.0–28.0)
Bicarbonate: 36.4 mmol/L — ABNORMAL HIGH (ref 20.0–28.0)
Calcium, Ion: 1.14 mmol/L — ABNORMAL LOW (ref 1.15–1.40)
Calcium, Ion: 1.17 mmol/L (ref 1.15–1.40)
HCT: 38 % (ref 36.0–46.0)
HCT: 34 % — ABNORMAL LOW (ref 36.0–46.0)
Hemoglobin: 12.9 g/dL (ref 12.0–15.0)
Hemoglobin: 11.6 g/dL — ABNORMAL LOW (ref 12.0–15.0)
O2 Saturation: 66 %
O2 Saturation: 92 %
Patient temperature: 97.4
Patient temperature: 98.3
Potassium: 3.7 mmol/L (ref 3.5–5.1)
Potassium: 3.7 mmol/L (ref 3.5–5.1)
Sodium: 142 mmol/L (ref 135–145)
Sodium: 143 mmol/L (ref 135–145)
TCO2: 36 mmol/L — ABNORMAL HIGH (ref 22–32)
TCO2: 39 mmol/L — ABNORMAL HIGH (ref 22–32)
pCO2 arterial: 62.2 mmHg — ABNORMAL HIGH (ref 32.0–48.0)
pCO2 arterial: 73.2 mmHg (ref 32.0–48.0)
pH, Arterial: 7.345 — ABNORMAL LOW (ref 7.350–7.450)
pH, Arterial: 7.304 — ABNORMAL LOW (ref 7.350–7.450)
pO2, Arterial: 36 mmHg — CL (ref 83.0–108.0)
pO2, Arterial: 71 mmHg — ABNORMAL LOW (ref 83.0–108.0)

## 2019-11-08 LAB — COMPREHENSIVE METABOLIC PANEL
ALT: 27 U/L (ref 0–44)
AST: 43 U/L — ABNORMAL HIGH (ref 15–41)
Albumin: 3.2 g/dL — ABNORMAL LOW (ref 3.5–5.0)
Alkaline Phosphatase: 76 U/L (ref 38–126)
Anion gap: 10 (ref 5–15)
BUN: 40 mg/dL — ABNORMAL HIGH (ref 8–23)
CO2: 31 mmol/L (ref 22–32)
Calcium: 8.6 mg/dL — ABNORMAL LOW (ref 8.9–10.3)
Chloride: 102 mmol/L (ref 98–111)
Creatinine, Ser: 1.48 mg/dL — ABNORMAL HIGH (ref 0.44–1.00)
GFR calc Af Amer: 38 mL/min — ABNORMAL LOW (ref >60)
GFR calc non Af Amer: 33 mL/min — ABNORMAL LOW (ref >60)
Glucose, Bld: 249 mg/dL — ABNORMAL HIGH (ref 70–99)
Potassium: 4 mmol/L (ref 3.5–5.1)
Sodium: 143 mmol/L (ref 135–145)
Total Bilirubin: 1 mg/dL (ref 0.3–1.2)
Total Protein: 7.6 g/dL (ref 6.5–8.1)

## 2019-11-08 LAB — CBC WITH DIFFERENTIAL/PLATELET
Abs Immature Granulocytes: 0.03 10*3/uL (ref 0.00–0.07)
Basophils Absolute: 0 10*3/uL (ref 0.0–0.1)
Basophils Relative: 0 %
Eosinophils Absolute: 0 10*3/uL (ref 0.0–0.5)
Eosinophils Relative: 0 %
HCT: 36.5 % (ref 36.0–46.0)
Hemoglobin: 11 g/dL — ABNORMAL LOW (ref 12.0–15.0)
Immature Granulocytes: 0 %
Lymphocytes Relative: 16 %
Lymphs Abs: 1.2 10*3/uL (ref 0.7–4.0)
MCH: 24.3 pg — ABNORMAL LOW (ref 26.0–34.0)
MCHC: 30.1 g/dL (ref 30.0–36.0)
MCV: 80.6 fL (ref 80.0–100.0)
Monocytes Absolute: 0.3 10*3/uL (ref 0.1–1.0)
Monocytes Relative: 4 %
Neutro Abs: 6.2 10*3/uL (ref 1.7–7.7)
Neutrophils Relative %: 80 %
Platelets: 168 10*3/uL (ref 150–400)
RBC: 4.53 MIL/uL (ref 3.87–5.11)
RDW: 13.9 % (ref 11.5–15.5)
WBC: 7.7 10*3/uL (ref 4.0–10.5)
nRBC: 0.5 % — ABNORMAL HIGH (ref 0.0–0.2)

## 2019-11-08 LAB — MRSA PCR SCREENING: MRSA by PCR: NEGATIVE

## 2019-11-08 LAB — D-DIMER, QUANTITATIVE: D-Dimer, Quant: 0.76 ug/mL-FEU — ABNORMAL HIGH (ref 0.00–0.50)

## 2019-11-08 LAB — ABO/RH: ABO/RH(D): B POS

## 2019-11-08 LAB — FERRITIN: Ferritin: 4040 ng/mL — ABNORMAL HIGH (ref 11–307)

## 2019-11-08 LAB — GLUCOSE, CAPILLARY
Glucose-Capillary: 183 mg/dL — ABNORMAL HIGH (ref 70–99)
Glucose-Capillary: 278 mg/dL — ABNORMAL HIGH (ref 70–99)
Glucose-Capillary: 285 mg/dL — ABNORMAL HIGH (ref 70–99)
Glucose-Capillary: 270 mg/dL — ABNORMAL HIGH (ref 70–99)

## 2019-11-08 LAB — C-REACTIVE PROTEIN: CRP: 10.9 mg/dL — ABNORMAL HIGH (ref <1.0)

## 2019-11-08 LAB — BRAIN NATRIURETIC PEPTIDE: B Natriuretic Peptide: 303.5 pg/mL — ABNORMAL HIGH (ref 0.0–100.0)

## 2019-11-08 LAB — MAGNESIUM: Magnesium: 2.2 mg/dL (ref 1.7–2.4)

## 2019-11-08 LAB — PHOSPHORUS: Phosphorus: 4.5 mg/dL (ref 2.5–4.6)

## 2019-11-08 MED ORDER — ROCURONIUM BROMIDE 50 MG/5ML IV SOLN
60.0000 mg | Freq: Once | INTRAVENOUS | Status: DC
  Filled 2019-11-08: qty 6

## 2019-11-08 MED ORDER — PROPOFOL 1000 MG/100ML IV EMUL
0.0000 ug/kg/min | INTRAVENOUS | Status: DC
  Administered 2019-11-08: 5 ug/kg/min via INTRAVENOUS
  Administered 2019-11-09: 17 ug/kg/min via INTRAVENOUS
  Administered 2019-11-09: 15 ug/kg/min via INTRAVENOUS
  Administered 2019-11-10 (×2): 20 ug/kg/min via INTRAVENOUS
  Filled 2019-11-08 (×5): qty 100

## 2019-11-08 MED ORDER — INSULIN DETEMIR 100 UNIT/ML ~~LOC~~ SOLN
0.1500 [IU]/kg | Freq: Two times a day (BID) | SUBCUTANEOUS | Status: DC
  Administered 2019-11-08 – 2019-11-12 (×10): 12 [IU] via SUBCUTANEOUS
  Filled 2019-11-08 (×11): qty 0.12

## 2019-11-08 MED ORDER — SODIUM CHLORIDE 0.9 % IV SOLN
INTRAVENOUS | Status: DC | PRN
  Administered 2019-11-08 – 2019-11-17 (×5): via INTRAVENOUS

## 2019-11-08 MED ORDER — DOCUSATE SODIUM 50 MG/5ML PO LIQD
100.0000 mg | Freq: Two times a day (BID) | ORAL | Status: DC | PRN
  Administered 2019-11-16 – 2019-11-17 (×2): 100 mg
  Filled 2019-11-08 (×2): qty 10

## 2019-11-08 MED ORDER — STERILE WATER FOR INJECTION IJ SOLN
INTRAMUSCULAR | Status: AC
  Filled 2019-11-08: qty 10

## 2019-11-08 MED ORDER — BISACODYL 10 MG RE SUPP
10.0000 mg | Freq: Every day | RECTAL | Status: DC | PRN
  Administered 2019-11-16 – 2019-11-17 (×2): 10 mg via RECTAL
  Filled 2019-11-08 (×3): qty 1

## 2019-11-08 MED ORDER — ETOMIDATE 2 MG/ML IV SOLN
10.0000 mg | Freq: Once | INTRAVENOUS | Status: AC
  Administered 2019-11-08: 15:00:00 10 mg via INTRAVENOUS

## 2019-11-08 MED ORDER — MIDAZOLAM HCL 2 MG/2ML IJ SOLN
2.0000 mg | Freq: Once | INTRAMUSCULAR | Status: AC
  Administered 2019-11-08: 15:00:00 2 mg via INTRAVENOUS

## 2019-11-08 MED ORDER — VITAMIN C 500 MG PO TABS
250.0000 mg | ORAL_TABLET | Freq: Every day | ORAL | Status: DC
  Administered 2019-11-08: 250 mg via ORAL
  Filled 2019-11-08: qty 1

## 2019-11-08 MED ORDER — ZINC SULFATE 220 (50 ZN) MG PO CAPS
220.0000 mg | ORAL_CAPSULE | Freq: Every day | ORAL | Status: DC
  Administered 2019-11-09 – 2019-11-18 (×10): 220 mg
  Filled 2019-11-08 (×10): qty 1

## 2019-11-08 MED ORDER — INSULIN ASPART 100 UNIT/ML ~~LOC~~ SOLN
0.0000 [IU] | SUBCUTANEOUS | Status: DC
  Administered 2019-11-08 (×2): 11 [IU] via SUBCUTANEOUS
  Administered 2019-11-09: 7 [IU] via SUBCUTANEOUS
  Administered 2019-11-09 (×2): 11 [IU] via SUBCUTANEOUS
  Administered 2019-11-09 (×2): 7 [IU] via SUBCUTANEOUS
  Administered 2019-11-09 – 2019-11-10 (×5): 11 [IU] via SUBCUTANEOUS
  Administered 2019-11-10: 7 [IU] via SUBCUTANEOUS
  Administered 2019-11-10: 04:00:00 11 [IU] via SUBCUTANEOUS
  Administered 2019-11-11: 15 [IU] via SUBCUTANEOUS
  Administered 2019-11-11: 11 [IU] via SUBCUTANEOUS
  Administered 2019-11-11 (×3): 20 [IU] via SUBCUTANEOUS
  Administered 2019-11-11 – 2019-11-12 (×2): 11 [IU] via SUBCUTANEOUS
  Administered 2019-11-12: 4 [IU] via SUBCUTANEOUS
  Administered 2019-11-12 (×2): 15 [IU] via SUBCUTANEOUS
  Administered 2019-11-12: 15:00:00 4 [IU] via SUBCUTANEOUS
  Administered 2019-11-13: 11 [IU] via SUBCUTANEOUS
  Administered 2019-11-13 (×2): 15 [IU] via SUBCUTANEOUS
  Administered 2019-11-13: 4 [IU] via SUBCUTANEOUS
  Administered 2019-11-13: 20 [IU] via SUBCUTANEOUS
  Administered 2019-11-14 (×2): 7 [IU] via SUBCUTANEOUS
  Administered 2019-11-14: 4 [IU] via SUBCUTANEOUS
  Administered 2019-11-14: 7 [IU] via SUBCUTANEOUS
  Administered 2019-11-14: 15 [IU] via SUBCUTANEOUS
  Administered 2019-11-14: 7 [IU] via SUBCUTANEOUS
  Administered 2019-11-15 (×2): 4 [IU] via SUBCUTANEOUS
  Administered 2019-11-15 (×4): 7 [IU] via SUBCUTANEOUS
  Administered 2019-11-16: 04:00:00 15 [IU] via SUBCUTANEOUS
  Administered 2019-11-16 (×3): 20 [IU] via SUBCUTANEOUS
  Administered 2019-11-16: 15 [IU] via SUBCUTANEOUS

## 2019-11-08 MED ORDER — INSULIN ASPART 100 UNIT/ML ~~LOC~~ SOLN
2.0000 [IU] | SUBCUTANEOUS | Status: DC
  Administered 2019-11-08 – 2019-11-10 (×13): 2 [IU] via SUBCUTANEOUS

## 2019-11-08 MED ORDER — VITAMIN C 500 MG PO TABS
250.0000 mg | ORAL_TABLET | Freq: Every day | ORAL | Status: DC
  Administered 2019-11-09 – 2019-11-18 (×10): 250 mg
  Filled 2019-11-08 (×10): qty 1

## 2019-11-08 MED ORDER — VECURONIUM BROMIDE 10 MG IV SOLR
INTRAVENOUS | Status: AC
  Filled 2019-11-08: qty 10

## 2019-11-08 MED ORDER — DEXTROSE 10 % IV SOLN: INTRAVENOUS | Status: DC

## 2019-11-08 MED ORDER — VITAL HIGH PROTEIN PO LIQD
1000.0000 mL | ORAL | Status: AC
  Administered 2019-11-08 – 2019-11-09 (×2): 1000 mL
  Administered 2019-11-09: 40 mL/h
  Administered 2019-11-10 – 2019-11-11 (×2): 1000 mL

## 2019-11-08 MED ORDER — CHLORHEXIDINE GLUCONATE 0.12% ORAL RINSE (MEDLINE KIT)
15.0000 mL | Freq: Two times a day (BID) | OROMUCOSAL | Status: DC
  Administered 2019-11-08 – 2019-11-16 (×16): 15 mL via OROMUCOSAL

## 2019-11-08 MED ORDER — METOPROLOL TARTRATE 25 MG/10 ML ORAL SUSPENSION
12.5000 mg | Freq: Two times a day (BID) | ORAL | Status: DC
  Administered 2019-11-08: 12.5 mg
  Filled 2019-11-08: qty 10

## 2019-11-08 MED ORDER — CLONIDINE HCL 0.1 MG PO TABS: 0.1000 mg | ORAL_TABLET | Freq: Three times a day (TID) | ORAL | Status: DC

## 2019-11-08 MED ORDER — ACETAMINOPHEN 325 MG PO TABS: 650.0000 mg | ORAL_TABLET | Freq: Four times a day (QID) | ORAL | Status: DC | PRN

## 2019-11-08 MED ORDER — FENTANYL CITRATE (PF) 100 MCG/2ML IJ SOLN
100.0000 ug | Freq: Once | INTRAMUSCULAR | Status: AC
  Administered 2019-11-08: 15:00:00 100 ug via INTRAVENOUS

## 2019-11-08 MED ORDER — PRO-STAT SUGAR FREE PO LIQD
30.0000 mL | Freq: Two times a day (BID) | ORAL | Status: DC
  Administered 2019-11-08 – 2019-11-09 (×3): 30 mL
  Filled 2019-11-08 (×3): qty 30

## 2019-11-08 MED ORDER — FENTANYL CITRATE (PF) 100 MCG/2ML IJ SOLN
INTRAMUSCULAR | Status: AC
  Administered 2019-11-08: 100 ug via INTRAVENOUS
  Filled 2019-11-08: qty 2

## 2019-11-08 MED ORDER — ATORVASTATIN CALCIUM 10 MG PO TABS
20.0000 mg | ORAL_TABLET | Freq: Every day | ORAL | Status: DC
  Administered 2019-11-08 – 2019-11-17 (×10): 20 mg
  Filled 2019-11-08 (×10): qty 2

## 2019-11-08 MED ORDER — MIDAZOLAM HCL 2 MG/2ML IJ SOLN
INTRAMUSCULAR | Status: AC
  Administered 2019-11-08: 2 mg via INTRAVENOUS
  Filled 2019-11-08: qty 4

## 2019-11-08 MED ORDER — ROCURONIUM BROMIDE 100 MG/10ML IV SOLN
60.0000 mg | Freq: Once | INTRAVENOUS | Status: AC
  Administered 2019-11-08: 60 mg via INTRAVENOUS

## 2019-11-08 MED ORDER — MIDAZOLAM HCL 2 MG/2ML IJ SOLN
1.0000 mg | INTRAMUSCULAR | Status: DC | PRN
  Administered 2019-11-09 – 2019-11-12 (×6): 1 mg via INTRAVENOUS
  Filled 2019-11-08 (×6): qty 2

## 2019-11-08 MED ORDER — ROCURONIUM BROMIDE 10 MG/ML (PF) SYRINGE
PREFILLED_SYRINGE | INTRAVENOUS | Status: AC
  Administered 2019-11-08: 60 mg
  Filled 2019-11-08: qty 10

## 2019-11-08 MED ORDER — ORAL CARE MOUTH RINSE
15.0000 mL | OROMUCOSAL | Status: DC
  Administered 2019-11-08 – 2019-11-18 (×97): 15 mL via OROMUCOSAL

## 2019-11-08 MED ORDER — FAMOTIDINE 40 MG/5ML PO SUSR
10.0000 mg | Freq: Every day | ORAL | Status: DC
  Administered 2019-11-08 – 2019-11-11 (×4): 10.4 mg
  Filled 2019-11-08 (×4): qty 2.5

## 2019-11-08 MED ORDER — BENZONATATE 100 MG PO CAPS: 200.0000 mg | ORAL_CAPSULE | Freq: Three times a day (TID) | ORAL | Status: DC | PRN

## 2019-11-08 MED ORDER — ETOMIDATE 2 MG/ML IV SOLN
INTRAVENOUS | Status: AC
  Administered 2019-11-08: 10 mg via INTRAVENOUS
  Filled 2019-11-08: qty 20

## 2019-11-08 MED ORDER — FENTANYL CITRATE (PF) 100 MCG/2ML IJ SOLN
25.0000 ug | INTRAMUSCULAR | Status: DC | PRN
  Administered 2019-11-08: 100 ug via INTRAVENOUS
  Administered 2019-11-09: 50 ug via INTRAVENOUS
  Administered 2019-11-10: 100 ug via INTRAVENOUS
  Administered 2019-11-12 (×3): 25 ug via INTRAVENOUS
  Administered 2019-11-12: 75 ug via INTRAVENOUS
  Administered 2019-11-12: 25 ug via INTRAVENOUS
  Administered 2019-11-14: 100 ug via INTRAVENOUS
  Administered 2019-11-17: 25 ug via INTRAVENOUS
  Administered 2019-11-17: 50 ug via INTRAVENOUS
  Filled 2019-11-08 (×4): qty 2

## 2019-11-08 MED ORDER — FAMOTIDINE 20 MG PO TABS: 10.0000 mg | ORAL_TABLET | Freq: Every day | ORAL | Status: DC

## 2019-11-08 MED ORDER — FUROSEMIDE 10 MG/ML IJ SOLN
40.0000 mg | Freq: Once | INTRAMUSCULAR | Status: AC
  Administered 2019-11-08: 40 mg via INTRAVENOUS
  Filled 2019-11-08: qty 4

## 2019-11-08 MED ORDER — ZINC SULFATE 220 (50 ZN) MG PO CAPS
220.0000 mg | ORAL_CAPSULE | Freq: Every day | ORAL | Status: DC
  Administered 2019-11-08: 13:00:00 220 mg via ORAL
  Filled 2019-11-08: qty 1

## 2019-11-08 NOTE — Progress Notes (Signed)
Woman called and wanted update on patient.  Woman identified herself as patient's niece [place] but is not the [place] listed under emergency contacts.  Explained to this woman that staff only update emergency contacts and that she would need to contact the patient's son because the patient herself is unable to talk at this time.  Woman on the phone was very rude blaming the nurses for taking breaks and not being able to talk to her earlier today.  I apologized and told her that she needed to contact the son.  This nurse then called patient's son and spoke with him and made him aware of the situation and that the lady stating she was patient's niece called and wanted an update on the patient.  RN asked patient's son to call this woman and speak with her about patient if he would like because nursing staff can not give her an update. Patient's son verbalized understanding of conversation with this nurse.

## 2019-11-08 NOTE — Progress Notes (Signed)
Spoke to patients granddaughter Caryl Pina [phone]) and gave updates per patient stating "whoever answers just let them know, I don't care who".  All questions were answered at this time.  Granddaughter stated that patient son was more than likely going to be admitted to the hospital today for [place] as well.

## 2019-11-08 NOTE — Progress Notes (Signed)
Pt developed progressive hypoxia during the course of the day.  PaO2 was 36 on NRB and high flow oxygen.  Using accessory muscle for breathing.  Becoming more confused.  D/w pt's granddaughter.  Family agreeable to proceed with intubation and see what kind of progress can be made over the next few days.  I explained that her chance for survival is very poor given her age, comorbid conditions, ABG findings, and radiographic findings.  Will initiate ARDS protocol.  F/u CXR, ABG.  RASS goal -1 to -2.  Start tube feeds.  CC time independent of procedure time 38 minutes  Chesley Mires, MD [place] 11/08/2019, 3:01 PM

## 2019-11-08 NOTE — Progress Notes (Signed)
RN called CN to report that patient had reached maximum oxygen support with heated high flow + NRB, was becoming labored and Sp02 remaining in the 73s. CN called RT to come to bedside. ABG performed. Dr. Halford Chessman paged. Ms. Antonio was given the opportunity to speak to her family and friends prior to intervention. Dr. Halford Chessman verbally consented the patient for intubation and called next of kin to report the chain of events. Verbal orders received for 10mg  Etomidate, 183mcg jfentanyl, 2mg  versed and 60mg  of rocuronium administered prior to intubation. 7.5 ETT, 23 at the lip. Bilateral breath sounds and color exchange confirmed.   !

## 2019-11-08 NOTE — Progress Notes (Signed)
Water from pt's Stephanie Hendricks backed up in to patient's tubing and shot up her nose. Pos aspiration? Pt was able to cough and clear airway. Water did come out of patient mouth and nose. O2 sats remained stable(mid 80's). Respiratory fixed HHFNC.  Pt Alert oriented X4 and resting at this time.

## 2019-11-08 NOTE — Procedures (Signed)
Intubation Procedure Note Stephanie Hendricks [id number] 1941-03-16  Procedure: Intubation Indications: Respiratory insufficiency  Procedure Details Consent: Risks of procedure as well as the alternatives and risks of each were explained to the (patient/caregiver).  Consent for procedure obtained. Time Out: Verified patient identification, verified procedure, site/side was marked, verified correct patient position, special equipment/implants available, medications/allergies/relevent history reviewed, required imaging and test results available.  Performed  Maximum sterile technique was used including cap, gloves, gown, hand hygiene and mask.  MAC and 3  Given 2 mg versed, 100 mcg fentanyl, 10 mg etomidate, 60 mg rocuronium.  Inserted 7.5 ETT to 23 cm at lip using video laryngoscope w/o difficulty.  Confirmed with CO2 detector and ausculation.   Evaluation Hemodynamic Status: BP stable throughout; O2 sats: currently acceptable Patient's Current Condition: stable Complications: No apparent complications Patient did tolerate procedure well. Chest X-ray ordered to verify placement.  CXR: pending.   Duval Macleod 11/08/2019

## 2019-11-08 NOTE — Progress Notes (Signed)
PULMONARY / CRITICAL CARE MEDICINE   NAME:  Stephanie Hendricks, MRN:  [id number], DOB:  [birth year], LOS: 3 ADMISSION DATE:  11/19/2019, CONSULTATION DATE:  11/07/19 REFERRING MD:  Triad   CHIEF COMPLAINT:  Sob   BRIEF HISTORY:    78 yo female brought to ER with dyspnea, weakness, fever 101.93F while living with house mate that tested positive for COVID 19.  Noted to have hypoxia and pulmonary infiltrates on CXR.  COVID Positive and transferred to [facility] ICU due to increasing O2 needs.       SIGNIFICANT PAST MEDICAL HISTORY    Post op A fib, CAD, HLD, HTN, DM, CKD 3, mild aortic stenosis, diastolic CHF  SIGNIFICANT EVENTS:  11/12 Admit 11/14 Transfer to [facility]  STUDIES:     CULTURES:  SARS CoV2 11/12 >> positive Blood 11/12 >>   COVID Therapy:  Remdesivir 11/13 >>  Solumedrol 11/14 >>   ANTIBIOTICS:    LINES/TUBES:     CONSULTANTS:    SUBJECTIVE:  Slept okay.  Feels breathing okay.  Not having much cough.  Denies abdominal pain.  OBJECTIVE:   CONSTITUTIONAL: BP (!) 163/70 (BP Location: Right Arm)   Pulse 77   Temp (!) 96.9 F (36.1 C) (Axillary)   Resp (!) 29   Ht 5\' 2"  (1.575 m)   Wt 83.3 kg   SpO2 (!) 79%   BMI 33.59 kg/m    I/O last 3 completed shifts: In: 991 [P.O.:741; IV Piggyback:250] Out: 1500 [Urine:1500]  PHYSICAL EXAM:  General - alert Eyes - pupils reactive ENT - no sinus tenderness, no stridor Cardiac - regular rate/rhythm, no murmur Chest - scattered rhonchi Abdomen - soft, non tender, + bowel sounds Extremities - no cyanosis, clubbing, or edema Skin - no rashes Neuro - normal strength, moves extremities, follows commands Psych - normal mood and behavior   RESOLVED PROBLEMS:    ASSESSMENT AND PLAN:   Acute hypoxic respiratory failure from COVID 19 pneumonia. - day 3 of remdesivir - day 2 of steroids - goal SpO2 85 to 95% - prone positioning as able - bronchial hygiene - up to chair as able - f/u CXR intermittently - zinc, vit C,  pepcid  Hx of CAD, HTN, HLD, chronic diastolic CHF. - continue ASA, lipitor, lopressor, catapres - lasix 40 mg x one on 11/15  Steroid induced hyperglycemia. - SSI with meal coverage lantus  CKD 3. - monitor renal fx, urine outpt   BEST PRACTICE:  Diet: carb modified DVT prophylaxis: SQ heparin SUP: Pepcid Mobility: OOB to chair Goals of care: Full code Disposition: ICU  LABS   CMP Latest Ref Rng & Units 11/07/2019 11/06/2019 11/11/2019  Glucose 70 - 99 mg/dL 207(H) 154(H) 181(H)  BUN 8 - 23 mg/dL 33(H) 24(H) 20  Creatinine 0.44 - 1.00 mg/dL 1.55(H) 1.69(H) 1.62(H)  Sodium 135 - 145 mmol/L 145 142 141  Potassium 3.5 - 5.1 mmol/L 4.8 3.6 3.7  Chloride 98 - 111 mmol/L 104 101 104  CO2 22 - 32 mmol/L 27 31 29   Calcium 8.9 - 10.3 mg/dL 8.8(L) 7.8(L) 7.7(L)  Total Protein 6.5 - 8.1 g/dL 7.5 7.2 7.7  Total Bilirubin 0.3 - 1.2 mg/dL 0.8 1.0 0.9  Alkaline Phos 38 - 126 U/L 87 68 82  AST 15 - 41 U/L 48(H) 42(H) 42(H)  ALT 0 - 44 U/L 26 24 26     CBC Latest Ref Rng & Units 11/07/2019 11/06/2019 11/03/2019  WBC 4.0 - 10.5 K/uL 7.8 5.8 6.7  Hemoglobin  12.0 - 15.0 g/dL 12.0 10.8(L) 11.9(L)  Hematocrit 36.0 - 46.0 % 41.5 36.9 40.4  Platelets 150 - 400 K/uL 176 166 197    ABG    Component Value Date/Time   PHART 7.335 (L) 02/25/2017 1200   PCO2ART 56.5 (H) 02/25/2017 1200   PO2ART 59.3 (L) 02/25/2017 1200   HCO3 29.3 (H) 02/25/2017 1200   TCO2 26 02/19/2017 1036   ACIDBASEDEF 1.0 02/19/2017 1036   O2SAT 57.2 02/27/2017 0518    CBG (last 3)  Recent Labs    11/07/19 1810 11/07/19 2134 11/08/19 0752  GLUCAP 336* 252* 183*       Component Value Date/Time   CRP 15.9 (H) 11/07/2019 0413   CRP 12.9 (H) 11/06/2019 0514   CRP 13.0 (H) 11/20/2019 [place], MD [facility] Pulmonary/Critical Care 11/08/2019, 10:35 AM

## 2019-11-08 NOTE — Progress Notes (Signed)
Updated pts friend [place] and answered all questions.

## 2019-11-09 ENCOUNTER — Inpatient Hospital Stay (HOSPITAL_COMMUNITY): Payer: Medicare Other

## 2019-11-09 DIAGNOSIS — Z9911 Dependence on respirator [ventilator] status: Secondary | ICD-10-CM

## 2019-11-09 DIAGNOSIS — J129 Viral pneumonia, unspecified: Secondary | ICD-10-CM

## 2019-11-09 LAB — POCT I-STAT 7, (LYTES, BLD GAS, ICA,H+H)
Acid-Base Excess: 7 mmol/L — ABNORMAL HIGH (ref 0.0–2.0)
Bicarbonate: 35.2 mmol/L — ABNORMAL HIGH (ref 20.0–28.0)
Calcium, Ion: 1.17 mmol/L (ref 1.15–1.40)
HCT: 32 % — ABNORMAL LOW (ref 36.0–46.0)
Hemoglobin: 10.9 g/dL — ABNORMAL LOW (ref 12.0–15.0)
O2 Saturation: 81 %
Patient temperature: 37.3
Potassium: 3.9 mmol/L (ref 3.5–5.1)
Sodium: 143 mmol/L (ref 135–145)
TCO2: 37 mmol/L — ABNORMAL HIGH (ref 22–32)
pCO2 arterial: 69.9 mmHg (ref 32.0–48.0)
pH, Arterial: 7.312 — ABNORMAL LOW (ref 7.350–7.450)
pO2, Arterial: 52 mmHg — ABNORMAL LOW (ref 83.0–108.0)

## 2019-11-09 LAB — CBC
HCT: 31.8 % — ABNORMAL LOW (ref 36.0–46.0)
Hemoglobin: 9.5 g/dL — ABNORMAL LOW (ref 12.0–15.0)
MCH: 24.1 pg — ABNORMAL LOW (ref 26.0–34.0)
MCHC: 29.9 g/dL — ABNORMAL LOW (ref 30.0–36.0)
MCV: 80.5 fL (ref 80.0–100.0)
Platelets: 224 10*3/uL (ref 150–400)
RBC: 3.95 MIL/uL (ref 3.87–5.11)
RDW: 13.6 % (ref 11.5–15.5)
WBC: 6.9 10*3/uL (ref 4.0–10.5)
nRBC: 1.3 % — ABNORMAL HIGH (ref 0.0–0.2)

## 2019-11-09 LAB — MAGNESIUM
Magnesium: 2.2 mg/dL (ref 1.7–2.4)
Magnesium: 2.3 mg/dL (ref 1.7–2.4)

## 2019-11-09 LAB — BASIC METABOLIC PANEL
Anion gap: 10 (ref 5–15)
BUN: 69 mg/dL — ABNORMAL HIGH (ref 8–23)
CO2: 31 mmol/L (ref 22–32)
Calcium: 8.2 mg/dL — ABNORMAL LOW (ref 8.9–10.3)
Chloride: 102 mmol/L (ref 98–111)
Creatinine, Ser: 1.99 mg/dL — ABNORMAL HIGH (ref 0.44–1.00)
GFR calc Af Amer: 27 mL/min — ABNORMAL LOW (ref >60)
GFR calc non Af Amer: 23 mL/min — ABNORMAL LOW (ref >60)
Glucose, Bld: 261 mg/dL — ABNORMAL HIGH (ref 70–99)
Potassium: 4.1 mmol/L (ref 3.5–5.1)
Sodium: 143 mmol/L (ref 135–145)

## 2019-11-09 LAB — C-REACTIVE PROTEIN: CRP: 10.1 mg/dL — ABNORMAL HIGH (ref <1.0)

## 2019-11-09 LAB — FERRITIN: Ferritin: 3052 ng/mL — ABNORMAL HIGH (ref 11–307)

## 2019-11-09 LAB — PREPARE FRESH FROZEN PLASMA: Unit division: 0

## 2019-11-09 LAB — GLUCOSE, CAPILLARY
Glucose-Capillary: 207 mg/dL — ABNORMAL HIGH (ref 70–99)
Glucose-Capillary: 229 mg/dL — ABNORMAL HIGH (ref 70–99)
Glucose-Capillary: 246 mg/dL — ABNORMAL HIGH (ref 70–99)
Glucose-Capillary: 274 mg/dL — ABNORMAL HIGH (ref 70–99)
Glucose-Capillary: 290 mg/dL — ABNORMAL HIGH (ref 70–99)
Glucose-Capillary: 298 mg/dL — ABNORMAL HIGH (ref 70–99)

## 2019-11-09 LAB — BPAM FFP
Blood Product Expiration Date: 202011152338
ISSUE DATE / TIME: 202011150005
Unit Type and Rh: 7300

## 2019-11-09 LAB — TRIGLYCERIDES: Triglycerides: 90 mg/dL (ref <150)

## 2019-11-09 LAB — PHOSPHORUS
Phosphorus: 4 mg/dL (ref 2.5–4.6)
Phosphorus: 2.8 mg/dL (ref 2.5–4.6)

## 2019-11-09 LAB — D-DIMER, QUANTITATIVE: D-Dimer, Quant: 0.5 ug/mL-FEU (ref 0.00–0.50)

## 2019-11-09 MED ORDER — PRO-STAT SUGAR FREE PO LIQD
60.0000 mL | Freq: Two times a day (BID) | ORAL | Status: DC
  Administered 2019-11-09 – 2019-11-11 (×4): 60 mL
  Filled 2019-11-09 (×4): qty 60

## 2019-11-09 NOTE — Progress Notes (Signed)
ETT flushed with 10 mL normal saline per MD order.  A small amount of clear/pink/tan secretions obtained.

## 2019-11-09 NOTE — Progress Notes (Signed)
PULMONARY / CRITICAL CARE MEDICINE   NAME:  Stephanie Hendricks, MRN:  [id number], DOB:  May 30, 1941, LOS: 4 ADMISSION DATE:  11/20/2019, CONSULTATION DATE:  11/07/19 REFERRING MD:  Triad   CHIEF COMPLAINT:  Sob   BRIEF HISTORY:    78 yo female brought to ER with dyspnea, weakness, fever 101.57F while living with house mate that tested positive for COVID 19.  Noted to have hypoxia and pulmonary infiltrates on CXR.  COVID Positive and transferred to [facility] ICU due to increasing O2 needs.       SIGNIFICANT PAST MEDICAL HISTORY    Post op A fib, CAD, HLD, HTN, DM, CKD 3, mild aortic stenosis, diastolic CHF  SIGNIFICANT EVENTS:  11/12 Admit 11/14 Transfer to [facility]  STUDIES:     CULTURES:  SARS CoV2 11/12 >> positive Blood 11/12 >>   COVID Therapy:  Remdesivir 11/13 >>  Solumedrol 11/14 >>   ANTIBIOTICS:    LINES/TUBES:     CONSULTANTS:    SUBJECTIVE:   Intubated on mechanical life support, critically ill  OBJECTIVE:   CONSTITUTIONAL: BP (!) 101/44   Pulse (!) 59   Temp 98.2 F (36.8 C) (Axillary)   Resp (!) 24   Ht 5\' 2"  (1.575 m)   Wt 83.3 kg   SpO2 93%   BMI 33.59 kg/m    I/O last 3 completed shifts: In: 2644.9 [P.O.:1101; I.V.:264.4; Blood:351.6; NG/GT:678; IV Piggyback:250] Out: [id number] [Urine:1350]  PHYSICAL EXAM:  General -intubated on mechanical life support, critically ill Eyes -nipples reactive HEENT: Endotracheal tube in place Cardiac -regular rate and rhythm, S1-S2 no MRG Chest -bilateral ventilated breath sounds Abdomen -soft, nontender, nondistended bowel sounds present Extremities -no significant edema Skin -no rash Neuro -moving all 4 extremities spontaneously Psych -sedated on mechanical ventilation   RESOLVED PROBLEMS:    ASSESSMENT AND PLAN:   Acute hypoxic respiratory failure from COVID 19 pneumonia, requiring intubation and mechanical ventilation -Day 4 of remdesivir -Day 2 of steroids -Continue to wean SpO2 to maintain sats 85 to  95% -May need to consider prone positioning however has been able to wean from FiO2 requirements -Airway clearance techniques, flushing of tube for tube patency -Zinc, vitamin C -Sedation with propofol, continuous fentanyl, as needed Versed  Hx of CAD, HTN, HLD, chronic diastolic CHF. -Continue aspirin, Lipitor, holding Lopressor, Catapres -We will continue to use as needed Lasix to maintain euvolemia  Steroid induced hyperglycemia. -SSI with CBGs  Nutrition: tube feeds  CKD 3. -Follow urine output, electrolytes stable   BEST PRACTICE:  Diet: tf DVT prophylaxis: SQ heparin SUP: Pepcid Mobility: BR Goals of care: Full code Disposition: ICU  LABS   CMP Latest Ref Rng & Units 11/09/2019 11/09/2019 11/08/2019  Glucose 70 - 99 mg/dL 261(H) - -  BUN 8 - 23 mg/dL 69(H) - -  Creatinine 0.44 - 1.00 mg/dL 1.99(H) - -  Sodium 135 - 145 mmol/L 143 143 143  Potassium 3.5 - 5.1 mmol/L 4.1 3.9 3.7  Chloride 98 - 111 mmol/L 102 - -  CO2 22 - 32 mmol/L 31 - -  Calcium 8.9 - 10.3 mg/dL 8.2(L) - -  Total Protein 6.5 - 8.1 g/dL - - -  Total Bilirubin 0.3 - 1.2 mg/dL - - -  Alkaline Phos 38 - 126 U/L - - -  AST 15 - 41 U/L - - -  ALT 0 - 44 U/L - - -    CBC Latest Ref Rng & Units 11/09/2019 11/09/2019 11/08/2019  WBC 4.0 -  10.5 K/uL 6.9 - -  Hemoglobin 12.0 - 15.0 g/dL 9.5(L) 10.9(L) 11.6(L)  Hematocrit 36.0 - 46.0 % 31.8(L) 32.0(L) 34.0(L)  Platelets 150 - 400 K/uL 224 - -    ABG    Component Value Date/Time   PHART 7.312 (L) 11/09/2019 0433   PCO2ART 69.9 (HH) 11/09/2019 0433   PO2ART 52.0 (L) 11/09/2019 0433   HCO3 35.2 (H) 11/09/2019 0433   TCO2 37 (H) 11/09/2019 0433   ACIDBASEDEF 1.0 02/19/2017 1036   O2SAT 81.0 11/09/2019 0433    CBG (last 3)  Recent Labs    11/09/19 0006 11/09/19 0409 11/09/19 0729  GLUCAP 298* 229* 290*       Component Value Date/Time   CRP 10.9 (H) 11/08/2019 0945   CRP 15.9 (H) 11/07/2019 0413   CRP 12.9 (H) 11/06/2019 0514     This patient is critically ill with multiple organ system failure; which, requires frequent high complexity decision making, assessment, support, evaluation, and titration of therapies. This was completed through the application of advanced monitoring technologies and extensive interpretation of multiple databases. During this encounter critical care time was devoted to patient care services described in this note for 32 minutes.  Garner Nash, DO [place] [facility] Critical Care 11/09/2019 8:05 AM

## 2019-11-09 NOTE — Progress Notes (Signed)
Spoke with patient's friend, [place] Scales and provided full update/answered all questions.

## 2019-11-09 NOTE — Progress Notes (Signed)
ETT flushed with 10 ml normal saline. Small amount of thick white/yellow secretions obtained.

## 2019-11-09 NOTE — Progress Notes (Signed)
Initial Nutrition Assessment  DOCUMENTATION CODES:   Obesity unspecified  INTERVENTION:   Continue Vital High Protein @ 40 ml/hr (960 ml/day) via OG tube Increase Prostat 60 ml BID  Provides: 1360 kcal, 144 grams protein, and 802 ml free water.  TF regimen and propofol at current rate providing 1584 total kcal/day   NUTRITION DIAGNOSIS:   Increased nutrient needs related to acute illness(COVID-19) as evidenced by estimated needs.  GOAL:   Patient will meet greater than or equal to 90% of their needs  MONITOR:   Labs, TF tolerance  REASON FOR ASSESSMENT:   Consult, Ventilator Enteral/tube feeding initiation and management  ASSESSMENT:   Pt with PMH of Afib, CAD, HLD, HTN, DM, CKD stage 3, mild aortic stenosis, and CHF admitted with COVID-19 PNA.   11/15 intubated   Patient is currently intubated on ventilator support MV: 12.1 L/min Temp (24hrs), Avg:98.6 F (37 C), Min:97.7 F (36.5 C), Max:99.1 F (37.3 C)  Propofol: 8.5 ml/hr provides: 224 kcal  Medications reviewed and include: SSI, 2 units novolog every 4 hours, levemir BID, solu-medrol, vitamin C, zinc Labs reviewed: CBG's: [phone]   TF: Vital High Protein @ 40 ml/hr with 30 ml Prostat BID: 1160 kcal and 114 grams protien   NUTRITION - FOCUSED PHYSICAL EXAM:  Deferred; RD working remotely   Diet Order:   Diet Order            Diet NPO time specified  Diet effective now              EDUCATION NEEDS:   No education needs have been identified at this time  Skin:  Skin Assessment: Reviewed RN Assessment  Last BM:  11/15  Height:   Ht Readings from Last 1 Encounters:  11/09/19 5\' 2"  (1.575 m)    Weight:   Wt Readings from Last 1 Encounters:  11/08/19 83.3 kg    Ideal Body Weight:  50 kg  BMI:  Body mass index is 33.59 kg/m.  Estimated Nutritional Needs:   Kcal:  1600  Protein:  125-150 grams  Fluid:  >1.6 L/day  Maylon Peppers RD, LDN, CNSC [phone] Pager [phone]  After Hours Pager

## 2019-11-09 NOTE — Progress Notes (Signed)
Attempted to reach out to patient's son, [name], via both phone numbers listed in [place], but no answer. Will attempt to reach out again at a later time to provide update.

## 2019-11-09 NOTE — Progress Notes (Signed)
Inpatient Diabetes Program Recommendations  AACE/ADA: New Consensus Statement on Inpatient Glycemic Control (2015)  Target Ranges:  Prepandial:   less than 140 mg/dL      Peak postprandial:   less than 180 mg/dL (1-2 hours)      Critically ill patients:  140 - 180 mg/dL   Results for AYN, CARRITHERS (MRN [id number]) as of 11/09/2019 12:14  Ref. Range 11/08/2019 07:52 11/08/2019 12:17 11/08/2019 16:15 11/08/2019 19:49  Glucose-Capillary Latest Ref Range: 70 - 99 mg/dL 183 (H)  8 units NOVOLOG  278 (H)  15 units NOVOLOG  285 (H)  13 units NOVOLOG +  12 units LEVEMIR  270 (H)  13 units NOVOLOG +  12 units LEVEMIR    Results for AZADEH, KALICKI (MRN [id number]) as of 11/09/2019 12:14  Ref. Range 11/09/2019 00:06 11/09/2019 04:09 11/09/2019 07:29  Glucose-Capillary Latest Ref Range: 70 - 99 mg/dL 298 (H)  13 units NOVOLOG  229 (H)  9 units NOVOLOG   290 (H)  13 units NOVOLOG +  12 units LEVEMIR     Admit with: Acute hypoxic respiratory failure from COVID 19 pneumonia  History: DM, CKD, CHF  Home DM Meds: Xultophy 38 units Daily  Current Orders: Novolog Resistant Correction Scale/ SSI (0-20 units) Q4 hours     Novolog 2 units Q4 hours     Levemir 12 units BID      Intubated yesterday afternoon.  Getting Tube Feeds 40cc/hr.  Also getting Solumedrol 40 mg BID.     MD- Please consider the following in-hospital insulin adjustments:  1. Increase Levemir to 15 units BID (patient gets 38 units Insulin degludec in her 38 unit dose Xultophy at home)  2. Increase Novolog Tube Feed Coverage to:  Novolog 4 units Q4 hours     --Will follow patient during hospitalization--  Wyn Quaker RN, MSN, CDE Diabetes Coordinator Inpatient Glycemic Control Team Team Pager: [phone] (8a-5p)

## 2019-11-10 LAB — D-DIMER, QUANTITATIVE: D-Dimer, Quant: 0.61 ug/mL-FEU — ABNORMAL HIGH (ref 0.00–0.50)

## 2019-11-10 LAB — COMPREHENSIVE METABOLIC PANEL
ALT: 26 U/L (ref 0–44)
AST: 44 U/L — ABNORMAL HIGH (ref 15–41)
Albumin: 2.5 g/dL — ABNORMAL LOW (ref 3.5–5.0)
Alkaline Phosphatase: 65 U/L (ref 38–126)
Anion gap: 12 (ref 5–15)
BUN: 79 mg/dL — ABNORMAL HIGH (ref 8–23)
CO2: 30 mmol/L (ref 22–32)
Calcium: 8.1 mg/dL — ABNORMAL LOW (ref 8.9–10.3)
Chloride: 103 mmol/L (ref 98–111)
Creatinine, Ser: 1.68 mg/dL — ABNORMAL HIGH (ref 0.44–1.00)
GFR calc Af Amer: 33 mL/min — ABNORMAL LOW (ref >60)
GFR calc non Af Amer: 28 mL/min — ABNORMAL LOW (ref >60)
Glucose, Bld: 267 mg/dL — ABNORMAL HIGH (ref 70–99)
Potassium: 3.9 mmol/L (ref 3.5–5.1)
Sodium: 145 mmol/L (ref 135–145)
Total Bilirubin: 0.5 mg/dL (ref 0.3–1.2)
Total Protein: 6.6 g/dL (ref 6.5–8.1)

## 2019-11-10 LAB — GLUCOSE, CAPILLARY
Glucose-Capillary: 219 mg/dL — ABNORMAL HIGH (ref 70–99)
Glucose-Capillary: 275 mg/dL — ABNORMAL HIGH (ref 70–99)
Glucose-Capillary: 264 mg/dL — ABNORMAL HIGH (ref 70–99)
Glucose-Capillary: 299 mg/dL — ABNORMAL HIGH (ref 70–99)
Glucose-Capillary: 295 mg/dL — ABNORMAL HIGH (ref 70–99)
Glucose-Capillary: 289 mg/dL — ABNORMAL HIGH (ref 70–99)

## 2019-11-10 LAB — TRIGLYCERIDES: Triglycerides: 99 mg/dL (ref <150)

## 2019-11-10 LAB — CULTURE, BLOOD (ROUTINE X 2)
Culture: NO GROWTH
Culture: NO GROWTH
Special Requests: ADEQUATE
Special Requests: ADEQUATE

## 2019-11-10 LAB — FERRITIN: Ferritin: 2882 ng/mL — ABNORMAL HIGH (ref 11–307)

## 2019-11-10 LAB — C-REACTIVE PROTEIN: CRP: 13.5 mg/dL — ABNORMAL HIGH (ref <1.0)

## 2019-11-10 MED ORDER — INSULIN ASPART 100 UNIT/ML ~~LOC~~ SOLN
6.0000 [IU] | SUBCUTANEOUS | Status: DC
  Administered 2019-11-10 – 2019-11-13 (×14): 6 [IU] via SUBCUTANEOUS

## 2019-11-10 MED ORDER — DEXMEDETOMIDINE HCL IN NACL 400 MCG/100ML IV SOLN
0.0000 ug/kg/h | INTRAVENOUS | Status: AC
  Administered 2019-11-10: 14:00:00 0.4 ug/kg/h via INTRAVENOUS
  Filled 2019-11-10: qty 100

## 2019-11-10 MED ORDER — FENTANYL CITRATE (PF) 100 MCG/2ML IJ SOLN: 25.0000 ug | Freq: Once | INTRAMUSCULAR | Status: DC

## 2019-11-10 MED ORDER — FENTANYL BOLUS VIA INFUSION
25.0000 ug | INTRAVENOUS | Status: DC | PRN
  Administered 2019-11-10 (×2): 25 ug via INTRAVENOUS
  Filled 2019-11-10: qty 25

## 2019-11-10 MED ORDER — FENTANYL 2500MCG IN NS 250ML (10MCG/ML) PREMIX INFUSION
25.0000 ug/h | INTRAVENOUS | Status: DC
  Administered 2019-11-10: 12:00:00 75 ug/h via INTRAVENOUS
  Administered 2019-11-11: 250 ug/h via INTRAVENOUS
  Administered 2019-11-11 – 2019-11-12 (×2): 150 ug/h via INTRAVENOUS
  Administered 2019-11-13: 05:00:00 200 ug/h via INTRAVENOUS
  Administered 2019-11-13 – 2019-11-14 (×2): 175 ug/h via INTRAVENOUS
  Administered 2019-11-14: 09:00:00 200 ug/h via INTRAVENOUS
  Administered 2019-11-15: 150 ug/h via INTRAVENOUS
  Administered 2019-11-16: 100 ug/h via INTRAVENOUS
  Administered 2019-11-17: 50 ug/h via INTRAVENOUS
  Filled 2019-11-10 (×11): qty 250

## 2019-11-10 NOTE — Progress Notes (Signed)
Patient on precedex gtt and became bradycardic. Precedex gtt stopped. Will administer PRN versed if patient needs additional sedation.

## 2019-11-10 NOTE — Progress Notes (Signed)
PULMONARY / CRITICAL CARE MEDICINE   NAME:  Stephanie Hendricks, MRN:  [id number], DOB:  1941/05/09, LOS: 5 ADMISSION DATE:  11/17/2019, CONSULTATION DATE:  11/07/19 REFERRING MD:  Triad   CHIEF COMPLAINT:  Sob   BRIEF HISTORY:    78 yo female brought to ER with dyspnea, weakness, fever 101.29F while living with house mate that tested positive for COVID 19.  Noted to have hypoxia and pulmonary infiltrates on CXR.  COVID Positive and transferred to [facility] ICU due to increasing O2 needs.       SIGNIFICANT PAST MEDICAL HISTORY    Post op A fib, CAD, HLD, HTN, DM, CKD 3, mild aortic stenosis, diastolic CHF  SIGNIFICANT EVENTS:  11/12 Admit 11/14 Transfer to [facility]  STUDIES:     CULTURES:  SARS CoV2 11/12 >> positive Blood 11/12 >>   COVID Therapy:  Remdesivir 11/13 >>  Solumedrol 11/14 >>   ANTIBIOTICS:    LINES/TUBES:   ETT 11/14>>>  CONSULTANTS:    SUBJECTIVE:   No events overnight, heavily sedated  OBJECTIVE:   CONSTITUTIONAL: BP (!) 169/57   Pulse 70   Temp 97.6 F (36.4 C) (Axillary)   Resp (!) 28   Ht 5\' 2"  (1.575 m)   Wt 85.5 kg   SpO2 100%   BMI 34.48 kg/m    I/O last 3 completed shifts: In: 2517.6 [I.V.:647.6; NG/GT:1620; IV Piggyback:250] Out: 950 [Urine:950]  PHYSICAL EXAM:  General Acutely ill appearing elderly female, NAD, sedate HEENT: [place]/AT, PERRL, EOM-I and MMM, ETT in place Cardiac RRR, Nl S1/S2 and -M/R/G Chest Coarse BS diffusely Abdomen Soft, NT, ND and +BS Extremities -edema and -tenderness Skin Intact Neuro -moving all 4 extremities spontaneously  I reviewed CXR myself, ETT is in a good position, R>L infiltrate noted  RESOLVED PROBLEMS:    ASSESSMENT AND PLAN:   Acute hypoxic respiratory failure from COVID 19 pneumonia, requiring intubation and mechanical ventilation -Day 5 of remdesivir, d/c after today -Day 3 of steroids -Continue to wean SpO2 to maintain sats 85 to 95% -Hold off proning today given improvement in FiO2  demand -Airway clearance techniques, flushing of tube for tube patency -Zinc, vitamin C -D/C propofol and start precedex and fentanyl drip  Hx of CAD, HTN, HLD, chronic diastolic CHF. -Continue aspirin, Lipitor, holding Lopressor, Catapres -Hold diureses -KVO IVF  Steroid induced hyperglycemia. -SSI with CBGs  Nutrition: tube feeds  CKD 3. -Follow urine output, electrolytes stable   BEST PRACTICE:  Diet: tf DVT prophylaxis: SQ heparin SUP: Pepcid Mobility: BR Goals of care: Full code Disposition: ICU  LABS   CMP Latest Ref Rng & Units 11/09/2019 11/09/2019 11/08/2019  Glucose 70 - 99 mg/dL 261(H) - -  BUN 8 - 23 mg/dL 69(H) - -  Creatinine 0.44 - 1.00 mg/dL 1.99(H) - -  Sodium 135 - 145 mmol/L 143 143 143  Potassium 3.5 - 5.1 mmol/L 4.1 3.9 3.7  Chloride 98 - 111 mmol/L 102 - -  CO2 22 - 32 mmol/L 31 - -  Calcium 8.9 - 10.3 mg/dL 8.2(L) - -  Total Protein 6.5 - 8.1 g/dL - - -  Total Bilirubin 0.3 - 1.2 mg/dL - - -  Alkaline Phos 38 - 126 U/L - - -  AST 15 - 41 U/L - - -  ALT 0 - 44 U/L - - -    CBC Latest Ref Rng & Units 11/09/2019 11/09/2019 11/08/2019  WBC 4.0 - 10.5 K/uL 6.9 - -  Hemoglobin 12.0 - 15.0  g/dL 9.5(L) 10.9(L) 11.6(L)  Hematocrit 36.0 - 46.0 % 31.8(L) 32.0(L) 34.0(L)  Platelets 150 - 400 K/uL 224 - -    ABG    Component Value Date/Time   PHART 7.312 (L) 11/09/2019 0433   PCO2ART 69.9 (HH) 11/09/2019 0433   PO2ART 52.0 (L) 11/09/2019 0433   HCO3 35.2 (H) 11/09/2019 0433   TCO2 37 (H) 11/09/2019 0433   ACIDBASEDEF 1.0 02/19/2017 1036   O2SAT 81.0 11/09/2019 0433    CBG (last 3)  Recent Labs    11/10/19 0053 11/10/19 0334 11/10/19 0744  GLUCAP 219* 275* 264*       Component Value Date/Time   CRP 10.1 (H) 11/09/2019 0555   CRP 10.9 (H) 11/08/2019 0945   CRP 15.9 (H) 11/07/2019 0413   The patient is critically ill with multiple organ systems failure and requires high complexity decision making for assessment and support,  frequent evaluation and titration of therapies, application of advanced monitoring technologies and extensive interpretation of multiple databases.   Critical Care Time devoted to patient care services described in this note is  32  Minutes. This time reflects time of care of this signee Dr Jennet Maduro. This critical care time does not reflect procedure time, or teaching time or supervisory time of PA/NP/Med student/Med Resident etc but could involve care discussion time.  Rush Farmer, M.D. [facility] Pulmonary/Critical Care Medicine.

## 2019-11-10 NOTE — Progress Notes (Signed)
Patient's son called for update. Sedation and ventilator discussed with son.  All questions answered.

## 2019-11-10 NOTE — Progress Notes (Signed)
RT Note:  ETT flushed with 10 ml saline per MD.

## 2019-11-10 NOTE — Progress Notes (Signed)
Pt's daughter in law called and requested update on patients status. This RN informed the caller that she is not listed as a contact so I can not provide an update. She then stated that pt's son is in the hospital. Still informed her that I can not give her an update, that she would have to call [place] the other contact listed or the son will need to call us and verbally give [place] consent since he is ill at this time. Pt's RN notified of multiple family members attempt to get an update.

## 2019-11-10 NOTE — Progress Notes (Signed)
Spoke with [place] and provided full update/answered all questions.

## 2019-11-11 ENCOUNTER — Inpatient Hospital Stay (HOSPITAL_COMMUNITY): Payer: Medicare Other

## 2019-11-11 LAB — GLUCOSE, CAPILLARY
Glucose-Capillary: 269 mg/dL — ABNORMAL HIGH (ref 70–99)
Glucose-Capillary: 287 mg/dL — ABNORMAL HIGH (ref 70–99)
Glucose-Capillary: 302 mg/dL — ABNORMAL HIGH (ref 70–99)
Glucose-Capillary: 359 mg/dL — ABNORMAL HIGH (ref 70–99)
Glucose-Capillary: 384 mg/dL — ABNORMAL HIGH (ref 70–99)
Glucose-Capillary: 422 mg/dL — ABNORMAL HIGH (ref 70–99)

## 2019-11-11 LAB — CBC
HCT: 37.8 % (ref 36.0–46.0)
Hemoglobin: 11.5 g/dL — ABNORMAL LOW (ref 12.0–15.0)
MCH: 24 pg — ABNORMAL LOW (ref 26.0–34.0)
MCHC: 30.4 g/dL (ref 30.0–36.0)
MCV: 78.8 fL — ABNORMAL LOW (ref 80.0–100.0)
Platelets: 374 10*3/uL (ref 150–400)
RBC: 4.8 MIL/uL (ref 3.87–5.11)
RDW: 14.1 % (ref 11.5–15.5)
WBC: 12.6 10*3/uL — ABNORMAL HIGH (ref 4.0–10.5)
nRBC: 0.8 % — ABNORMAL HIGH (ref 0.0–0.2)

## 2019-11-11 LAB — POCT I-STAT 7, (LYTES, BLD GAS, ICA,H+H)
Acid-Base Excess: 8 mmol/L — ABNORMAL HIGH (ref 0.0–2.0)
Bicarbonate: 35.2 mmol/L — ABNORMAL HIGH (ref 20.0–28.0)
Calcium, Ion: 1.18 mmol/L (ref 1.15–1.40)
HCT: 37 % (ref 36.0–46.0)
Hemoglobin: 12.6 g/dL (ref 12.0–15.0)
O2 Saturation: 78 %
Patient temperature: 98.3
Potassium: 3.9 mmol/L (ref 3.5–5.1)
Sodium: 150 mmol/L — ABNORMAL HIGH (ref 135–145)
TCO2: 37 mmol/L — ABNORMAL HIGH (ref 22–32)
pCO2 arterial: 60.7 mmHg — ABNORMAL HIGH (ref 32.0–48.0)
pH, Arterial: 7.371 (ref 7.350–7.450)
pO2, Arterial: 45 mmHg — ABNORMAL LOW (ref 83.0–108.0)

## 2019-11-11 LAB — PHOSPHORUS: Phosphorus: 3.2 mg/dL (ref 2.5–4.6)

## 2019-11-11 LAB — BASIC METABOLIC PANEL
Anion gap: 10 (ref 5–15)
BUN: 84 mg/dL — ABNORMAL HIGH (ref 8–23)
CO2: 32 mmol/L (ref 22–32)
Calcium: 8.5 mg/dL — ABNORMAL LOW (ref 8.9–10.3)
Chloride: 107 mmol/L (ref 98–111)
Creatinine, Ser: 1.48 mg/dL — ABNORMAL HIGH (ref 0.44–1.00)
GFR calc Af Amer: 38 mL/min — ABNORMAL LOW (ref >60)
GFR calc non Af Amer: 33 mL/min — ABNORMAL LOW (ref >60)
Glucose, Bld: 284 mg/dL — ABNORMAL HIGH (ref 70–99)
Potassium: 3.8 mmol/L (ref 3.5–5.1)
Sodium: 149 mmol/L — ABNORMAL HIGH (ref 135–145)

## 2019-11-11 LAB — C-REACTIVE PROTEIN: CRP: 10.4 mg/dL — ABNORMAL HIGH

## 2019-11-11 LAB — D-DIMER, QUANTITATIVE: D-Dimer, Quant: 0.48 ug{FEU}/mL (ref 0.00–0.50)

## 2019-11-11 LAB — TRIGLYCERIDES: Triglycerides: 98 mg/dL

## 2019-11-11 LAB — FERRITIN: Ferritin: 781 ng/mL — ABNORMAL HIGH (ref 11–307)

## 2019-11-11 LAB — MAGNESIUM: Magnesium: 2.8 mg/dL — ABNORMAL HIGH (ref 1.7–2.4)

## 2019-11-11 MED ORDER — PRO-STAT SUGAR FREE PO LIQD
60.0000 mL | Freq: Three times a day (TID) | ORAL | Status: DC
  Administered 2019-11-11 – 2019-11-14 (×9): 60 mL
  Filled 2019-11-11 (×8): qty 60

## 2019-11-11 MED ORDER — VITAL HIGH PROTEIN PO LIQD: 1000.0000 mL | ORAL | Status: AC

## 2019-11-11 MED ORDER — VITAL HIGH PROTEIN PO LIQD: 1000.0000 mL | ORAL | Status: DC

## 2019-11-11 MED ORDER — POTASSIUM CHLORIDE 20 MEQ/15ML (10%) PO SOLN
40.0000 meq | Freq: Three times a day (TID) | ORAL | Status: AC
  Administered 2019-11-11 (×2): 40 meq
  Filled 2019-11-11 (×2): qty 30

## 2019-11-11 MED ORDER — FREE WATER
250.0000 mL | Status: DC
  Administered 2019-11-11 – 2019-11-18 (×39): 250 mL

## 2019-11-11 MED ORDER — METOLAZONE 10 MG PO TABS
10.0000 mg | ORAL_TABLET | Freq: Once | ORAL | Status: AC
  Administered 2019-11-11: 10 mg via ORAL
  Filled 2019-11-11: qty 1

## 2019-11-11 MED ORDER — FUROSEMIDE 10 MG/ML IJ SOLN
40.0000 mg | Freq: Four times a day (QID) | INTRAMUSCULAR | Status: AC
  Administered 2019-11-11 (×3): 40 mg via INTRAVENOUS
  Filled 2019-11-11 (×3): qty 4

## 2019-11-11 MED ORDER — FAMOTIDINE 40 MG/5ML PO SUSR
20.0000 mg | Freq: Every day | ORAL | Status: DC
  Administered 2019-11-12 – 2019-11-18 (×7): 20 mg
  Filled 2019-11-11 (×7): qty 2.5

## 2019-11-11 MED ORDER — VITAL AF 1.2 CAL PO LIQD
1000.0000 mL | ORAL | Status: DC
  Administered 2019-11-12 – 2019-11-13 (×2): 1000 mL

## 2019-11-11 MED ORDER — AMLODIPINE BESYLATE 5 MG PO TABS
10.0000 mg | ORAL_TABLET | Freq: Every day | ORAL | Status: DC
  Administered 2019-11-11: 10:00:00 10 mg via ORAL
  Filled 2019-11-11 (×2): qty 2

## 2019-11-11 MED ORDER — CLONIDINE HCL 0.1 MG PO TABS: 0.2000 mg | ORAL_TABLET | Freq: Two times a day (BID) | ORAL | Status: DC

## 2019-11-11 MED ORDER — CLONIDINE HCL 0.1 MG PO TABS
0.2000 mg | ORAL_TABLET | Freq: Three times a day (TID) | ORAL | Status: DC
  Administered 2019-11-11 – 2019-11-13 (×7): 0.2 mg
  Filled 2019-11-11 (×8): qty 2

## 2019-11-11 NOTE — Progress Notes (Signed)
PULMONARY / CRITICAL CARE MEDICINE   NAME:  Stephanie Hendricks, MRN:  [id number], DOB:  Nov 26, 1941, LOS: 44 ADMISSION DATE:  11/08/2019, CONSULTATION DATE:  11/07/19 REFERRING MD:  Triad   CHIEF COMPLAINT:  Sob   BRIEF HISTORY:    78 yo female brought to ER with dyspnea, weakness, fever 101.69F while living with house mate that tested positive for COVID 19.  Noted to have hypoxia and pulmonary infiltrates on CXR.  COVID Positive and transferred to [facility] ICU due to increasing O2 needs.       SIGNIFICANT PAST MEDICAL HISTORY    Post op A fib, CAD, HLD, HTN, DM, CKD 3, mild aortic stenosis, diastolic CHF  SIGNIFICANT EVENTS:  11/12 Admit 11/14 Transfer to [facility]  STUDIES:     CULTURES:  SARS CoV2 11/12 >> positive Blood 11/12 >>   COVID Therapy:  Remdesivir 11/13 >>  Solumedrol 11/14 >>   ANTIBIOTICS:    LINES/TUBES:   ETT 11/14>>>  CONSULTANTS:    SUBJECTIVE:   No events overnight, continues to require high FiO2 and PEEP  OBJECTIVE:   CONSTITUTIONAL: BP (!) 185/63   Pulse 78   Temp (!) 96.9 F (36.1 C) (Axillary)   Resp (!) 35   Ht 5\' 2"  (1.575 m)   Wt 85.3 kg   SpO2 91%   BMI 34.40 kg/m    I/O last 3 completed shifts: In: 2708.9 [I.V.:820.8; NG/GT:1620; IV Piggyback:268.1] Out: 1700 [Urine:1700]  PHYSICAL EXAM:  General: Acutely ill appearing female, NAD, sedate HEENT: [place]/AT, PERRL, EOM-I and MMM, ETT in place Cardiac: RRR, Nl S1/S2 and -M/R/G Chest: Coarse diffusely Abdomen: Soft, NT, ND and +BS Extremities: -edema and -tenderness Skin Intact Neuro -moving all 4 extremities spontaneously  I reviewed CXR myself, ETT ok and infiltrate noted  Discussed with RT and RN  RESOLVED PROBLEMS:    ASSESSMENT AND PLAN:   Acute hypoxic respiratory failure from COVID 19 pneumonia, requiring intubation and mechanical ventilation -Remdesivir course completed 11/17 -Day 4 of steroids -Continue to wean SpO2 to maintain sats 85 to 95% -Hold off proning today given  improvement in FiO2 demand -Airway clearance techniques, flushing of tube for tube patency -Zinc, vitamin C -D/C propofol today, continue precedex and fentanyl -Target is 70% and 12 today  Hx of CAD, HTN, HLD, chronic diastolic CHF. -Continue aspirin, Lipitor, holding Lopressor, Catapres -Lasix x3 -Zaroxolyn x1 -KCl replacement -BMET in AM -KVO IVF -Free water for hypernatremia 250 q4  Steroid induced hyperglycemia. -SSI with CBGs  Nutrition: -Tube feeds per nutrition  CKD 3. -Follow urine output, electrolytes stable -See above for fluid management  HTN: Restart home amlodipine Restart home clonidine    BEST PRACTICE:  Diet: tf DVT prophylaxis: SQ heparin SUP: Pepcid Mobility: BR Goals of care: Full code Disposition: ICU  LABS   CMP Latest Ref Rng & Units 11/11/2019 11/11/2019 11/10/2019  Glucose 70 - 99 mg/dL - 284(H) 267(H)  BUN 8 - 23 mg/dL - 84(H) 79(H)  Creatinine 0.44 - 1.00 mg/dL - 1.48(H) 1.68(H)  Sodium 135 - 145 mmol/L 150(H) 149(H) 145  Potassium 3.5 - 5.1 mmol/L 3.9 3.8 3.9  Chloride 98 - 111 mmol/L - 107 103  CO2 22 - 32 mmol/L - 32 30  Calcium 8.9 - 10.3 mg/dL - 8.5(L) 8.1(L)  Total Protein 6.5 - 8.1 g/dL - - 6.6  Total Bilirubin 0.3 - 1.2 mg/dL - - 0.5  Alkaline Phos 38 - 126 U/L - - 65  AST 15 - 41 U/L - -  44(H)  ALT 0 - 44 U/L - - 26    CBC Latest Ref Rng & Units 11/11/2019 11/11/2019 11/09/2019  WBC 4.0 - 10.5 K/uL - 12.6(H) 6.9  Hemoglobin 12.0 - 15.0 g/dL 12.6 11.5(L) 9.5(L)  Hematocrit 36.0 - 46.0 % 37.0 37.8 31.8(L)  Platelets 150 - 400 K/uL - 374 224    ABG    Component Value Date/Time   PHART 7.371 11/11/2019 0657   PCO2ART 60.7 (H) 11/11/2019 0657   PO2ART 45.0 (L) 11/11/2019 0657   HCO3 35.2 (H) 11/11/2019 0657   TCO2 37 (H) 11/11/2019 0657   ACIDBASEDEF 1.0 02/19/2017 1036   O2SAT 78.0 11/11/2019 0657    CBG (last 3)  Recent Labs    11/11/19 0023 11/11/19 0506 11/11/19 0807  GLUCAP 269* 287* 302*        Component Value Date/Time   CRP 10.4 (H) 11/11/2019 0555   CRP 13.5 (H) 11/10/2019 0730   CRP 10.1 (H) 11/09/2019 0555   The patient is critically ill with multiple organ systems failure and requires high complexity decision making for assessment and support, frequent evaluation and titration of therapies, application of advanced monitoring technologies and extensive interpretation of multiple databases.   Critical Care Time devoted to patient care services described in this note is  33  Minutes. This time reflects time of care of this signee Dr Jennet Maduro. This critical care time does not reflect procedure time, or teaching time or supervisory time of PA/NP/Med student/Med Resident etc but could involve care discussion time.  Rush Farmer, M.D. [facility] Pulmonary/Critical Care Medicine.

## 2019-11-11 NOTE — Progress Notes (Signed)
Pt's son is admitted to [facility] 63M and vented. Pt's RN updated. Will call [place] in the AM to see if we can get another family member as an emergency contact.

## 2019-11-11 NOTE — Progress Notes (Signed)
Spoke with patient's daughter in law, [place], and provided full update/answered all questions.

## 2019-11-11 NOTE — Progress Notes (Signed)
Inpatient Diabetes Program Recommendations  AACE/ADA: New Consensus Statement on Inpatient Glycemic Control (2015)  Target Ranges:  Prepandial:   less than 140 mg/dL      Peak postprandial:   less than 180 mg/dL (1-2 hours)      Critically ill patients:  140 - 180 mg/dL   Lab Results  Component Value Date   GLUCAP 302 (H) 11/11/2019   HGBA1C 8.7 (H) 11/06/2019    Review of Glycemic Control Results for Stephanie Hendricks, Stephanie Hendricks (MRN [id number]) as of 11/11/2019 09:56  Ref. Range 11/10/2019 15:20 11/10/2019 19:29 11/11/2019 00:23 11/11/2019 05:06 11/11/2019 08:07  Glucose-Capillary Latest Ref Range: 70 - 99 mg/dL 295 (H) Novolog 13 unis 289 (H) Novolog 17 units 269 (H) Novolog 17 units 287 (H) Novolog 17 units 302 (H) Novolog 21 units + Levemir 12 units   Admit with: Acute hypoxic respiratory failure from COVID 19 pneumonia  History: DM, CKD, CHF  Home DM Meds: Xultophy 38 units Daily  Current Orders: Novolog Resistant Correction Scale/ SSI (0-20 units) Q4 hours                           Novolog 6 units Q4 hours                           Levemir 15 units BID  Inpatient Diabetes Program Recommendations:    -Increase Levemir to 18 units bid -Increase Novolog to 8 units q 4 hrs. Tube feed coverage (hold if feeding held or stopped).  Thank you, Nani Gasser. Shaheim Mahar, RN, MSN, CDE  Diabetes Coordinator Inpatient Glycemic Control Team Team Pager [phone] (8am-5pm) 11/11/2019 10:01 AM

## 2019-11-11 NOTE — Progress Notes (Signed)
Nutrition Follow-up  DOCUMENTATION CODES:   Obesity unspecified  INTERVENTION:   Initiate Vital AF 1.2 @ 40 ml/hr (960 ml/day) via Cortrak tube Increase Prostat 60 ml TID  Provides: 1752 kcal, 162 grams protein, and 778 ml free water.  Total free water: 2278  NUTRITION DIAGNOSIS:   Increased nutrient needs related to acute illness(COVID-19) as evidenced by estimated needs.  GOAL:   Patient will meet greater than or equal to 90% of their needs  MONITOR:   Labs, TF tolerance  REASON FOR ASSESSMENT:   Consult, Ventilator Enteral/tube feeding initiation and management  ASSESSMENT:   Pt with PMH of Afib, CAD, HLD, HTN, DM, CKD stage 3, mild aortic stenosis, and CHF admitted with COVID-19 PNA.   11/15 intubated   Patient is currently intubated on ventilator support MV: 12 L/min Temp (24hrs), Avg:97.9 F (36.6 C), Min:96.9 F (36.1 C), Max:98.5 F (36.9 C)  Propofol off Medications reviewed and include: SSI, 6 units novolog every 4 hours, levemir BID, solu-medrol, vitamin C, zinc Precedex  250 ml free water every 4 hours : 1500 ml  Labs reviewed: Na 150 (H) CBG's: 302-359  TF: Vital High Protein @ 40 ml/hr with 60 ml Prostat TID Provides: 1560 kcal, 144 grams protein   NUTRITION - FOCUSED PHYSICAL EXAM:  WNL   Diet Order:   Diet Order            Diet NPO time specified  Diet effective now              EDUCATION NEEDS:   No education needs have been identified at this time  Skin:  Skin Assessment: Reviewed RN Assessment  Last BM:  11/18  Height:   Ht Readings from Last 1 Encounters:  11/09/19 5\' 2"  (1.575 m)    Weight:   Wt Readings from Last 1 Encounters:  11/11/19 85.3 kg    Ideal Body Weight:  50 kg  BMI:  Body mass index is 34.4 kg/m.  Estimated Nutritional Needs:   Kcal:  1600-1900  Protein:  125-150 grams  Fluid:  >1.6 L/day  Maylon Peppers RD, LDN, CNSC [phone] Pager [phone] After Hours Pager

## 2019-11-11 NOTE — Procedures (Signed)
Cortrak  Person Inserting Tube:  Maylon Peppers C, RD Tube Type:  Cortrak - 43 inches Tube Location:  Left nare Initial Placement:  Postpyloric Secured by: Bridle Technique Used to Measure Tube Placement:  Documented cm marking at nare/ corner of mouth Cortrak Secured At:  76 cm    Cortrak Tube Team Note:  Consult received to place a Cortrak feeding tube.   No x-ray is required. RN may begin using tube.   If the tube becomes dislodged please keep the tube and contact the Cortrak team at [email or web address] (password [email or web address]) for replacement.  If after hours and replacement cannot be delayed, place a NG tube and confirm placement with an abdominal x-ray.    [place], [place], [place] Pager [phone] After Hours Pager

## 2019-11-12 ENCOUNTER — Inpatient Hospital Stay (HOSPITAL_COMMUNITY): Payer: Medicare Other

## 2019-11-12 LAB — POCT I-STAT, CHEM 8
BUN: 107 mg/dL — ABNORMAL HIGH (ref 8–23)
Calcium, Ion: 1.11 mmol/L — ABNORMAL LOW (ref 1.15–1.40)
Chloride: 96 mmol/L — ABNORMAL LOW (ref 98–111)
Creatinine, Ser: 1.7 mg/dL — ABNORMAL HIGH (ref 0.44–1.00)
Glucose, Bld: 306 mg/dL — ABNORMAL HIGH (ref 70–99)
HCT: 39 % (ref 36.0–46.0)
Hemoglobin: 13.3 g/dL (ref 12.0–15.0)
Potassium: 3.4 mmol/L — ABNORMAL LOW (ref 3.5–5.1)
Sodium: 150 mmol/L — ABNORMAL HIGH (ref 135–145)
TCO2: 36 mmol/L — ABNORMAL HIGH (ref 22–32)

## 2019-11-12 LAB — CBC
HCT: 41.3 % (ref 36.0–46.0)
Hemoglobin: 12.4 g/dL (ref 12.0–15.0)
MCH: 23.9 pg — ABNORMAL LOW (ref 26.0–34.0)
MCHC: 30 g/dL (ref 30.0–36.0)
MCV: 79.6 fL — ABNORMAL LOW (ref 80.0–100.0)
Platelets: 446 10*3/uL — ABNORMAL HIGH (ref 150–400)
RBC: 5.19 MIL/uL — ABNORMAL HIGH (ref 3.87–5.11)
RDW: 14.4 % (ref 11.5–15.5)
WBC: 15.1 10*3/uL — ABNORMAL HIGH (ref 4.0–10.5)
nRBC: 1.5 % — ABNORMAL HIGH (ref 0.0–0.2)

## 2019-11-12 LAB — MAGNESIUM: Magnesium: 2.7 mg/dL — ABNORMAL HIGH (ref 1.7–2.4)

## 2019-11-12 LAB — GLUCOSE, CAPILLARY
Glucose-Capillary: 168 mg/dL — ABNORMAL HIGH (ref 70–99)
Glucose-Capillary: 171 mg/dL — ABNORMAL HIGH (ref 70–99)
Glucose-Capillary: 290 mg/dL — ABNORMAL HIGH (ref 70–99)
Glucose-Capillary: 328 mg/dL — ABNORMAL HIGH (ref 70–99)
Glucose-Capillary: 340 mg/dL — ABNORMAL HIGH (ref 70–99)
Glucose-Capillary: 91 mg/dL (ref 70–99)
Glucose-Capillary: 93 mg/dL (ref 70–99)

## 2019-11-12 LAB — BASIC METABOLIC PANEL
Anion gap: 14 (ref 5–15)
BUN: 98 mg/dL — ABNORMAL HIGH (ref 8–23)
CO2: 36 mmol/L — ABNORMAL HIGH (ref 22–32)
Calcium: 9.2 mg/dL (ref 8.9–10.3)
Chloride: 103 mmol/L (ref 98–111)
Creatinine, Ser: 1.59 mg/dL — ABNORMAL HIGH (ref 0.44–1.00)
GFR calc Af Amer: 35 mL/min — ABNORMAL LOW (ref >60)
GFR calc non Af Amer: 30 mL/min — ABNORMAL LOW (ref >60)
Glucose, Bld: 153 mg/dL — ABNORMAL HIGH (ref 70–99)
Potassium: 3.9 mmol/L (ref 3.5–5.1)
Sodium: 153 mmol/L — ABNORMAL HIGH (ref 135–145)

## 2019-11-12 LAB — POCT I-STAT 7, (LYTES, BLD GAS, ICA,H+H)
Acid-Base Excess: 14 mmol/L — ABNORMAL HIGH (ref 0.0–2.0)
Bicarbonate: 42.2 mmol/L — ABNORMAL HIGH (ref 20.0–28.0)
Calcium, Ion: 1.2 mmol/L (ref 1.15–1.40)
HCT: 39 % (ref 36.0–46.0)
Hemoglobin: 13.3 g/dL (ref 12.0–15.0)
O2 Saturation: 91 %
Patient temperature: 98.6
Potassium: 3.8 mmol/L (ref 3.5–5.1)
Sodium: 152 mmol/L — ABNORMAL HIGH (ref 135–145)
TCO2: 44 mmol/L — ABNORMAL HIGH (ref 22–32)
pCO2 arterial: 69.4 mmHg (ref 32.0–48.0)
pH, Arterial: 7.392 (ref 7.350–7.450)
pO2, Arterial: 66 mmHg — ABNORMAL LOW (ref 83.0–108.0)

## 2019-11-12 LAB — PHOSPHORUS: Phosphorus: 3.7 mg/dL (ref 2.5–4.6)

## 2019-11-12 LAB — D-DIMER, QUANTITATIVE: D-Dimer, Quant: 0.62 ug/mL-FEU — ABNORMAL HIGH (ref 0.00–0.50)

## 2019-11-12 LAB — TRIGLYCERIDES: Triglycerides: 85 mg/dL (ref <150)

## 2019-11-12 LAB — C-REACTIVE PROTEIN: CRP: 7.2 mg/dL — ABNORMAL HIGH (ref <1.0)

## 2019-11-12 LAB — FERRITIN: Ferritin: 3167 ng/mL — ABNORMAL HIGH (ref 11–307)

## 2019-11-12 MED ORDER — POTASSIUM CHLORIDE 20 MEQ/15ML (10%) PO SOLN
40.0000 meq | Freq: Once | ORAL | Status: AC
  Administered 2019-11-12: 40 meq
  Filled 2019-11-12: qty 30

## 2019-11-12 MED ORDER — PANCRELIPASE (LIP-PROT-AMYL) 10440-39150 UNITS PO TABS
20880.0000 [IU] | ORAL_TABLET | Freq: Once | ORAL | Status: AC
  Administered 2019-11-12: 20880 [IU]
  Filled 2019-11-12: qty 2

## 2019-11-12 MED ORDER — SODIUM BICARBONATE 650 MG PO TABS
650.0000 mg | ORAL_TABLET | Freq: Once | ORAL | Status: AC
  Administered 2019-11-12: 650 mg
  Filled 2019-11-12: qty 1

## 2019-11-12 MED ORDER — AMLODIPINE BESYLATE 5 MG PO TABS
10.0000 mg | ORAL_TABLET | Freq: Every day | ORAL | Status: DC
  Administered 2019-11-12 – 2019-11-13 (×2): 10 mg
  Filled 2019-11-12: qty 2

## 2019-11-12 MED ORDER — AMLODIPINE BESYLATE 5 MG PO TABS: 10.0000 mg | ORAL_TABLET | Freq: Every day | ORAL | Status: DC

## 2019-11-12 MED ORDER — FUROSEMIDE 10 MG/ML IJ SOLN
40.0000 mg | Freq: Four times a day (QID) | INTRAMUSCULAR | Status: AC
  Administered 2019-11-12 (×3): 40 mg via INTRAVENOUS
  Filled 2019-11-12 (×3): qty 4

## 2019-11-12 MED ORDER — METOLAZONE 10 MG PO TABS
10.0000 mg | ORAL_TABLET | Freq: Once | ORAL | Status: AC
  Administered 2019-11-12: 10 mg via ORAL
  Filled 2019-11-12: qty 1

## 2019-11-12 MED ORDER — ASPIRIN 81 MG PO CHEW
81.0000 mg | CHEWABLE_TABLET | Freq: Every day | ORAL | Status: DC
  Administered 2019-11-12 – 2019-11-18 (×7): 81 mg
  Filled 2019-11-12 (×7): qty 1

## 2019-11-12 NOTE — Care Management Important Message (Signed)
Important Message  Patient Details  Name: Stephanie Hendricks MRN: [id number] Date of Birth: [birth year]   Medicare Important Message Given:  Yes - Important Message mailed due to current National Emergency    Verbal consent obtained due to current National Emergency  Relationship to patient: Friend Contact Name: Apolonio Schneiders Scales Call Date: 11/12/19  Time: 1530 Phone: [phone] Outcome: Spoke with contact Important Message mailed to: Patient address on file     [name] [address] Circle 11/12/2019, 3:31 PM

## 2019-11-12 NOTE — Plan of Care (Signed)
  Problem: Clinical Measurements: Goal: Ability to maintain clinical measurements within normal limits will improve Outcome: Progressing Goal: Will remain free from infection Outcome: Progressing Goal: Diagnostic test results will improve Outcome: Progressing Goal: Respiratory complications will improve Outcome: Progressing Goal: Cardiovascular complication will be avoided Outcome: Progressing   Problem: Activity: Goal: Risk for activity intolerance will decrease Outcome: Progressing   Problem: Elimination: Goal: Will not experience complications related to bowel motility Outcome: Progressing Goal: Will not experience complications related to urinary retention Outcome: Progressing   Problem: Pain Managment: Goal: General experience of comfort will improve Outcome: Progressing   Problem: Safety: Goal: Ability to remain free from injury will improve Outcome: Progressing   Problem: Skin Integrity: Goal: Risk for impaired skin integrity will decrease Outcome: Progressing   Problem: Education: Goal: Knowledge of risk factors and measures for prevention of condition will improve Outcome: Progressing   Problem: Coping: Goal: Psychosocial and spiritual needs will be supported Outcome: Progressing   Problem: Respiratory: Goal: Will maintain a patent airway Outcome: Progressing Goal: Complications related to the disease process, condition or treatment will be avoided or minimized Outcome: Progressing

## 2019-11-12 NOTE — Progress Notes (Signed)
A cousin called to speak with the patient. They were not on the contact list for this pt, so I told them they'd need to contact the pt's immediate family to get information about the patient.

## 2019-11-12 NOTE — Progress Notes (Addendum)
PULMONARY / CRITICAL CARE MEDICINE   NAME:  Stephanie Hendricks, MRN:  [id number], DOB:  [birth year], LOS: 7 ADMISSION DATE:  10/30/2019, CONSULTATION DATE:  11/07/19 REFERRING MD:  Triad   CHIEF COMPLAINT:  Sob   BRIEF HISTORY:    78 yo female brought to ER with dyspnea, weakness, fever 101.78F while living with house mate that tested positive for COVID 19.  Noted to have hypoxia and pulmonary infiltrates on CXR.  COVID Positive and transferred to [facility] ICU due to increasing O2 needs.       SIGNIFICANT PAST MEDICAL HISTORY    Post op A fib, CAD, HLD, HTN, DM, CKD 3, mild aortic stenosis, diastolic CHF  SIGNIFICANT EVENTS:  11/12 Admit 11/14 Transfer to [facility]  STUDIES:     CULTURES:  SARS CoV2 11/12 >> positive Blood 11/12 >>   COVID Therapy:  Remdesivir 11/13 >> 11/17 Solumedrol 11/14 >>   ANTIBIOTICS:    LINES/TUBES:   ETT 11/14>>>  CONSULTANTS:    SUBJECTIVE:   No events overnight, continues to require high FiO2 and PEEP  OBJECTIVE:   CONSTITUTIONAL: BP (!) 186/73   Pulse 82   Temp 98.5 F (36.9 C) (Oral)   Resp 20   Ht 5\' 2"  (1.575 m)   Wt 84.2 kg   SpO2 90%   BMI 33.95 kg/m    I/O last 3 completed shifts: In: 3114.4 [I.V.:934.4; NG/GT:2180] Out: [id number] [Urine:4350]  PHYSICAL EXAM: Gen:      No acute distress HEENT:  EOMI, sclera anicteric Neck:     No masses; no thyromegaly, ETT Lungs:    Clear to auscultation bilaterally; normal respiratory effort CV:         Regular rate and rhythm; no murmurs Abd:      + bowel sounds; soft, non-tender; no palpable masses, no distension Ext:    No edema; adequate peripheral perfusion Skin:      Warm and dry; no rash Neuro: Sedated   RESOLVED PROBLEMS:    ASSESSMENT AND PLAN:   Acute hypoxic respiratory failure from COVID 19 pneumonia, requiring intubation and mechanical ventilation Remdesivir course completed 11/17 Continue steroids. Follow ABG  Hx of CAD, HTN, HLD, chronic diastolic CHF. Continue aspirin,  Lipitor, holding Lopressor, Catapres Repeat Lasix and Zaroxolyn. Free water for hypernatremia  Steroid induced hyperglycemia. SSI, Levemir  Nutrition: Tube feeds per nutrition  CKD 3. Follow urine output, electrolytes stable  HTN: Continue amlodipine, clonidine  BEST PRACTICE:  Diet: Tube feeds DVT prophylaxis: SQ heparin SUP: Pepcid Mobility: BR Goals of care: Full code Disposition: ICU  LABS   CMP Latest Ref Rng & Units 11/12/2019 11/12/2019 11/11/2019  Glucose 70 - 99 mg/dL 153(H) - -  BUN 8 - 23 mg/dL 98(H) - -  Creatinine 0.44 - 1.00 mg/dL 1.59(H) - -  Sodium 135 - 145 mmol/L 153(H) 152(H) 150(H)  Potassium 3.5 - 5.1 mmol/L 3.9 3.8 3.9  Chloride 98 - 111 mmol/L 103 - -  CO2 22 - 32 mmol/L 36(H) - -  Calcium 8.9 - 10.3 mg/dL 9.2 - -  Total Protein 6.5 - 8.1 g/dL - - -  Total Bilirubin 0.3 - 1.2 mg/dL - - -  Alkaline Phos 38 - 126 U/L - - -  AST 15 - 41 U/L - - -  ALT 0 - 44 U/L - - -    CBC Latest Ref Rng & Units 11/12/2019 11/12/2019 11/11/2019  WBC 4.0 - 10.5 K/uL 15.1(H) - -  Hemoglobin 12.0 - 15.0 g/dL  12.4 13.3 12.6  Hematocrit 36.0 - 46.0 % 41.3 39.0 37.0  Platelets 150 - 400 K/uL 446(H) - -    ABG    Component Value Date/Time   PHART 7.392 11/12/2019 0339   PCO2ART 69.4 (HH) 11/12/2019 0339   PO2ART 66.0 (L) 11/12/2019 0339   HCO3 42.2 (H) 11/12/2019 0339   TCO2 44 (H) 11/12/2019 0339   ACIDBASEDEF 1.0 02/19/2017 1036   O2SAT 91.0 11/12/2019 0339    CBG (last 3)  Recent Labs    11/11/19 1959 11/12/19 0009 11/12/19 0421  GLUCAP 384* 328* 171*       Component Value Date/Time   CRP 7.2 (H) 11/12/2019 0436   CRP 10.4 (H) 11/11/2019 0555   CRP 13.5 (H) 11/10/2019 0730   The patient is critically ill with multiple organ system failure and requires high complexity decision making for assessment and support, frequent evaluation and titration of therapies, advanced monitoring, review of radiographic studies and interpretation of complex  data.   Critical Care Time devoted to patient care services, exclusive of separately billable procedures, described in this note is 35 minutes.   Marshell Garfinkel MD [facility] Pager [phone] If no answer call [phone] 11/12/2019, 9:00 AM

## 2019-11-12 NOTE — Progress Notes (Signed)
[place] notified of K 3.4. Awaiting replacement orders.

## 2019-11-12 NOTE — Progress Notes (Signed)
eLink Physician-Brief Progress Note Patient Name: SHAIANNA FELMLEE DOB: Nov 23, 1941 MRN: [id number]   Date of Service  11/12/2019  HPI/Events of Note  Hypokalemia - K+ = 3.4 and Creatinine = 1.7.  eICU Interventions  Will replace K+.      Intervention Category Major Interventions: Electrolyte abnormality - evaluation and management  Nashley Cordoba [place] 11/12/2019, 9:44 PM

## 2019-11-12 NOTE — Progress Notes (Signed)
Spoke with pts daughter in law, pts son who is point of care has COVID and is currently hospitalized.  Updated on pt status.  Currently sedated on ventilator.  Able to wean oxygen today.  All questions answered.

## 2019-11-12 NOTE — Progress Notes (Signed)
Lavaged pt with 10 cc saline flush per MD order. Copious amounts of hemoptysis thick secretions suctioned out. Pt anxious but stable. VS within normal limits.

## 2019-11-12 NOTE — Plan of Care (Addendum)
Pt's cortrak kinked off during the day, had to be removed and replaced with an OG tube. TF now resumed. Diuresing. K 3.4 this evening, replaced with 23mEq via OGT. On Fentanyl gtt for sedation/analgesia.   Has required occasional lavage by RT for desats, RT has increased FiO2 from 50% to 75% overnight.  Received PRN Versed at 11:45pm for agitation; BP dropped transiently, requiring a 250cc fluid bolus.  Problem: Clinical Measurements: Goal: Ability to maintain clinical measurements within normal limits will improve Outcome: Progressing   Problem: Clinical Measurements: Goal: Respiratory complications will improve Outcome: Progressing   Problem: Clinical Measurements: Goal: Cardiovascular complication will be avoided Outcome: Progressing   Problem: Nutrition: Goal: Adequate nutrition will be maintained Outcome: Progressing   Problem: Coping: Goal: Level of anxiety will decrease Outcome: Progressing   Problem: Elimination: Goal: Will not experience complications related to bowel motility Outcome: Progressing   Problem: Elimination: Goal: Will not experience complications related to urinary retention Outcome: Progressing   Problem: Pain Managment: Goal: General experience of comfort will improve Outcome: Progressing   Problem: Respiratory: Goal: Complications related to the disease process, condition or treatment will be avoided or minimized Outcome: Progressing

## 2019-11-13 LAB — CBC
HCT: 38 % (ref 36.0–46.0)
Hemoglobin: 11.4 g/dL — ABNORMAL LOW (ref 12.0–15.0)
MCH: 23.7 pg — ABNORMAL LOW (ref 26.0–34.0)
MCHC: 30 g/dL (ref 30.0–36.0)
MCV: 78.8 fL — ABNORMAL LOW (ref 80.0–100.0)
Platelets: 420 10*3/uL — ABNORMAL HIGH (ref 150–400)
RBC: 4.82 MIL/uL (ref 3.87–5.11)
RDW: 14.2 % (ref 11.5–15.5)
WBC: 13.7 10*3/uL — ABNORMAL HIGH (ref 4.0–10.5)
nRBC: 3.4 % — ABNORMAL HIGH (ref 0.0–0.2)

## 2019-11-13 LAB — POCT I-STAT 7, (LYTES, BLD GAS, ICA,H+H)
Acid-Base Excess: 14 mmol/L — ABNORMAL HIGH (ref 0.0–2.0)
Acid-Base Excess: 16 mmol/L — ABNORMAL HIGH (ref 0.0–2.0)
Bicarbonate: 41.6 mmol/L — ABNORMAL HIGH (ref 20.0–28.0)
Bicarbonate: 42.1 mmol/L — ABNORMAL HIGH (ref 20.0–28.0)
Calcium, Ion: 1.06 mmol/L — ABNORMAL LOW (ref 1.15–1.40)
Calcium, Ion: 1.07 mmol/L — ABNORMAL LOW (ref 1.15–1.40)
HCT: 34 % — ABNORMAL LOW (ref 36.0–46.0)
HCT: 34 % — ABNORMAL LOW (ref 36.0–46.0)
Hemoglobin: 11.6 g/dL — ABNORMAL LOW (ref 12.0–15.0)
Hemoglobin: 11.6 g/dL — ABNORMAL LOW (ref 12.0–15.0)
O2 Saturation: 92 %
O2 Saturation: 96 %
Patient temperature: 97.5
Patient temperature: 37.2
Potassium: 3.3 mmol/L — ABNORMAL LOW (ref 3.5–5.1)
Potassium: 4.1 mmol/L (ref 3.5–5.1)
Sodium: 145 mmol/L (ref 135–145)
Sodium: 147 mmol/L — ABNORMAL HIGH (ref 135–145)
TCO2: 44 mmol/L — ABNORMAL HIGH (ref 22–32)
TCO2: 44 mmol/L — ABNORMAL HIGH (ref 22–32)
pCO2 arterial: 63 mmHg — ABNORMAL HIGH (ref 32.0–48.0)
pCO2 arterial: 58.6 mmHg — ABNORMAL HIGH (ref 32.0–48.0)
pH, Arterial: 7.425 (ref 7.350–7.450)
pH, Arterial: 7.465 — ABNORMAL HIGH (ref 7.350–7.450)
pO2, Arterial: 62 mmHg — ABNORMAL LOW (ref 83.0–108.0)
pO2, Arterial: 80 mmHg — ABNORMAL LOW (ref 83.0–108.0)

## 2019-11-13 LAB — GLUCOSE, CAPILLARY
Glucose-Capillary: 364 mg/dL — ABNORMAL HIGH (ref 70–99)
Glucose-Capillary: 327 mg/dL — ABNORMAL HIGH (ref 70–99)
Glucose-Capillary: 331 mg/dL — ABNORMAL HIGH (ref 70–99)
Glucose-Capillary: 300 mg/dL — ABNORMAL HIGH (ref 70–99)
Glucose-Capillary: 185 mg/dL — ABNORMAL HIGH (ref 70–99)

## 2019-11-13 LAB — BASIC METABOLIC PANEL
Anion gap: 16 — ABNORMAL HIGH (ref 5–15)
BUN: 137 mg/dL — ABNORMAL HIGH (ref 8–23)
CO2: 36 mmol/L — ABNORMAL HIGH (ref 22–32)
Calcium: 8.5 mg/dL — ABNORMAL LOW (ref 8.9–10.3)
Chloride: 97 mmol/L — ABNORMAL LOW (ref 98–111)
Creatinine, Ser: 1.98 mg/dL — ABNORMAL HIGH (ref 0.44–1.00)
GFR calc Af Amer: 27 mL/min — ABNORMAL LOW (ref >60)
GFR calc non Af Amer: 23 mL/min — ABNORMAL LOW (ref >60)
Glucose, Bld: 379 mg/dL — ABNORMAL HIGH (ref 70–99)
Potassium: 3.5 mmol/L (ref 3.5–5.1)
Sodium: 149 mmol/L — ABNORMAL HIGH (ref 135–145)

## 2019-11-13 LAB — C-REACTIVE PROTEIN: CRP: 8.4 mg/dL — ABNORMAL HIGH (ref <1.0)

## 2019-11-13 LAB — MAGNESIUM: Magnesium: 2.7 mg/dL — ABNORMAL HIGH (ref 1.7–2.4)

## 2019-11-13 LAB — D-DIMER, QUANTITATIVE: D-Dimer, Quant: 0.74 ug/mL-FEU — ABNORMAL HIGH (ref 0.00–0.50)

## 2019-11-13 LAB — TRIGLYCERIDES: Triglycerides: 141 mg/dL (ref <150)

## 2019-11-13 LAB — FERRITIN: Ferritin: 2539 ng/mL — ABNORMAL HIGH (ref 11–307)

## 2019-11-13 LAB — PHOSPHORUS: Phosphorus: 5.2 mg/dL — ABNORMAL HIGH (ref 2.5–4.6)

## 2019-11-13 MED ORDER — MIDAZOLAM 50MG/50ML (1MG/ML) PREMIX INFUSION
0.0000 mg/h | INTRAVENOUS | Status: DC
  Administered 2019-11-13 – 2019-11-15 (×3): 3 mg/h via INTRAVENOUS
  Filled 2019-11-13 (×3): qty 50

## 2019-11-13 MED ORDER — NOREPINEPHRINE 4 MG/250ML-% IV SOLN
0.0000 ug/min | INTRAVENOUS | Status: DC
  Administered 2019-11-13: 6 ug/min via INTRAVENOUS
  Administered 2019-11-18: 2 ug/min via INTRAVENOUS
  Administered 2019-11-18: 11:00:00 14 ug/min via INTRAVENOUS
  Filled 2019-11-13 (×2): qty 250

## 2019-11-13 MED ORDER — POTASSIUM CHLORIDE 20 MEQ/15ML (10%) PO SOLN
40.0000 meq | Freq: Once | ORAL | Status: AC
  Administered 2019-11-13: 40 meq
  Filled 2019-11-13: qty 30

## 2019-11-13 MED ORDER — INSULIN ASPART 100 UNIT/ML ~~LOC~~ SOLN
10.0000 [IU] | SUBCUTANEOUS | Status: DC
  Administered 2019-11-13 – 2019-11-16 (×18): 10 [IU] via SUBCUTANEOUS

## 2019-11-13 MED ORDER — MIDAZOLAM BOLUS VIA INFUSION
1.0000 mg | INTRAVENOUS | Status: DC | PRN
  Administered 2019-11-14 – 2019-11-16 (×2): 1 mg via INTRAVENOUS
  Filled 2019-11-13: qty 1

## 2019-11-13 MED ORDER — INSULIN DETEMIR 100 UNIT/ML ~~LOC~~ SOLN
20.0000 [IU] | Freq: Two times a day (BID) | SUBCUTANEOUS | Status: DC
  Administered 2019-11-13 – 2019-11-14 (×4): 20 [IU] via SUBCUTANEOUS
  Filled 2019-11-13 (×5): qty 0.2

## 2019-11-13 MED ORDER — NOREPINEPHRINE 4 MG/250ML-% IV SOLN
INTRAVENOUS | Status: AC
  Filled 2019-11-13: qty 250

## 2019-11-13 MED ORDER — MIDAZOLAM 50MG/50ML (1MG/ML) PREMIX INFUSION
2.0000 mg/h | INTRAVENOUS | Status: DC
  Filled 2019-11-13: qty 50

## 2019-11-13 NOTE — Plan of Care (Signed)

## 2019-11-13 NOTE — Progress Notes (Signed)
PULMONARY / CRITICAL CARE MEDICINE   NAME:  Stephanie Hendricks, MRN:  [id number], DOB:  [birth year], LOS: 3 ADMISSION DATE:  11/10/2019, CONSULTATION DATE:  11/07/19 REFERRING MD:  Triad   CHIEF COMPLAINT:  Sob   BRIEF HISTORY:    78 yo female brought to ER with dyspnea, weakness, fever 101.85F while living with house mate that tested positive for COVID 19.  Noted to have hypoxia and pulmonary infiltrates on CXR.  COVID Positive and transferred to [facility] ICU due to increasing O2 needs.       SIGNIFICANT PAST MEDICAL HISTORY    Post op A fib, CAD, HLD, HTN, DM, CKD 3, mild aortic stenosis, diastolic CHF  SIGNIFICANT EVENTS:  11/12 Admit 11/14 Transfer to [facility]  STUDIES:     CULTURES:  SARS CoV2 11/12 >> positive Blood 11/12 >>   COVID Therapy:  Remdesivir 11/13 >> 11/17 Solumedrol 11/14 >>   ANTIBIOTICS:    LINES/TUBES:   ETT 11/14>>>  CONSULTANTS:    SUBJECTIVE:   Cortrak clogged.  Replaced by OG tube.  Tube feeds resumed Remains on the ventilator.  No acute events.  Objective   Blood pressure (!) 110/52, pulse 65, temperature 98.6 F (37 C), temperature source Axillary, resp. rate (!) 0, height 5\' 2"  (1.575 m), weight 84.2 kg, SpO2 94 %.    Vent Mode: PRVC FiO2 (%):  [50 %-75 %] 75 % Set Rate:  [35 bmp] 35 bmp Vt Set:  [350 mL] 350 mL PEEP:  [12 cmH20] 12 cmH20 Plateau Pressure:  [28 cmH20-31 cmH20] 28 cmH20   Intake/Output Summary (Last 24 hours) at 11/13/2019 0843 Last data filed at 11/13/2019 0615 Gross per 24 hour  Intake 3642.16 ml  Output 3050 ml  Net 592.16 ml   Filed Weights   11/10/19 0459 11/11/19 0500 11/12/19 0500  Weight: 85.5 kg 85.3 kg 84.2 kg     PHYSICAL EXAM: Gen:      No acute distress HEENT:  EOMI, sclera anicteric Neck:     No masses; no thyromegaly, ETT Lungs:    Clear to auscultation bilaterally; normal respiratory effort CV:         Regular rate and rhythm; no murmurs Abd:      + bowel sounds; soft, non-tender; no palpable masses, no  distension Ext:    No edema; adequate peripheral perfusion Skin:      Warm and dry; no rash Neuro: Sedated  RESOLVED PROBLEMS:    ASSESSMENT AND PLAN:   Acute hypoxic respiratory failure from COVID 19 pneumonia, requiring intubation and mechanical ventilation Remdesivir course completed 11/17 Continue steroids. Follow ABG  Hx of CAD, HTN, HLD, chronic diastolic CHF. Continue aspirin, Lipitor, holding Lopressor, Catapres Hold Lasix as BUN/creatinine is increasing. Free water for hypernatremia  Steroid induced hyperglycemia. SSI, Increase levemir dose  Nutrition: Tube feeds per nutrition  CKD 3. Follow urine output, electrolytes stable  HTN: Continue Norvasc  BEST PRACTICE:  Diet: Tube feeds DVT prophylaxis: SQ heparin SUP: Pepcid Mobility: BR Goals of care: Full code. Family update pending Disposition: ICU  LABS   CMP Latest Ref Rng & Units 11/13/2019 11/12/2019 11/12/2019  Glucose 70 - 99 mg/dL 379(H) 306(H) 153(H)  BUN 8 - 23 mg/dL 137(H) 107(H) 98(H)  Creatinine 0.44 - 1.00 mg/dL 1.98(H) 1.70(H) 1.59(H)  Sodium 135 - 145 mmol/L 149(H) 150(H) 153(H)  Potassium 3.5 - 5.1 mmol/L 3.5 3.4(L) 3.9  Chloride 98 - 111 mmol/L 97(L) 96(L) 103  CO2 22 - 32 mmol/L 36(H) - 36(H)  Calcium 8.9 - 10.3 mg/dL 8.5(L) - 9.2  Total Protein 6.5 - 8.1 g/dL - - -  Total Bilirubin 0.3 - 1.2 mg/dL - - -  Alkaline Phos 38 - 126 U/L - - -  AST 15 - 41 U/L - - -  ALT 0 - 44 U/L - - -    CBC Latest Ref Rng & Units 11/13/2019 11/12/2019 11/12/2019  WBC 4.0 - 10.5 K/uL 13.7(H) - 15.1(H)  Hemoglobin 12.0 - 15.0 g/dL 11.4(L) 13.3 12.4  Hematocrit 36.0 - 46.0 % 38.0 39.0 41.3  Platelets 150 - 400 K/uL 420(H) - 446(H)    ABG    Component Value Date/Time   PHART 7.392 11/12/2019 0339   PCO2ART 69.4 (HH) 11/12/2019 0339   PO2ART 66.0 (L) 11/12/2019 0339   HCO3 42.2 (H) 11/12/2019 0339   TCO2 36 (H) 11/12/2019 2114   ACIDBASEDEF 1.0 02/19/2017 1036   O2SAT 91.0 11/12/2019 0339     CBG (last 3)  Recent Labs    11/12/19 2326 11/13/19 0304 11/13/19 0825  GLUCAP 340* 364* 327*       Component Value Date/Time   CRP 8.4 (H) 11/13/2019 0333   CRP 7.2 (H) 11/12/2019 0436   CRP 10.4 (H) 11/11/2019 0555   The patient is critically ill with multiple organ system failure and requires high complexity decision making for assessment and support, frequent evaluation and titration of therapies, advanced monitoring, review of radiographic studies and interpretation of complex data.   Critical Care Time devoted to patient care services, exclusive of separately billable procedures, described in this note is 35 minutes.   Marshell Garfinkel MD [place] [facility] Pager [phone] If no answer call [phone] 11/13/2019, 8:38 AM

## 2019-11-13 NOTE — Progress Notes (Signed)
Pt proned at this time. Head is turned to the right, ett secured in proper position with white tape. Multiple foam pads were placed on her forehead, chin, cheeks, under nose, and back of neck to avoid pressure ulcers per protocol. No complications notedd during or after proning. VS within normal limits. Pt stable throughout.

## 2019-11-13 NOTE — Progress Notes (Signed)
Inpatient Diabetes Program Recommendations  AACE/ADA: New Consensus Statement on Inpatient Glycemic Control (2015)  Target Ranges:  Prepandial:   less than 140 mg/dL      Peak postprandial:   less than 180 mg/dL (1-2 hours)      Critically ill patients:  140 - 180 mg/dL   Lab Results  Component Value Date   GLUCAP 331 (H) 11/13/2019   HGBA1C 8.7 (H) 11/06/2019    Review of Glycemic Control  Results for AHRIAH, FRANKENBERGER (MRN [id number]) as of 11/13/2019 14:24  Ref. Range 11/12/2019 23:26 11/13/2019 03:04 11/13/2019 08:25 11/13/2019 13:12  Glucose-Capillary Latest Ref Range: 70 - 99 mg/dL 340 (H) novolog 21units 364 (H) novolog 26units 327 (H) novolog 21units 331 (H) novolog 21units   Diabetes history: DM2  Current orders for Inpatient glycemic control: Levemir 20 BID (started this morning up from 18units BID) + Novolog 0-20 correction Q4HR + tube feed coverage 6 units Q4HR  Inpatient Diabetes Program Recommendations:     Note: CBG's elevated in the 300's with tube feeds.  Please consider increasing tube feed coverage to 10 units Q4HRS-stop if feeds are held or discontinued.   Thank you, Geoffry Paradise, RN, BSN Diabetes Coordinator Inpatient Diabetes Program [phone] (team pager from 8a-5p)

## 2019-11-13 NOTE — Progress Notes (Signed)
Lavaged pt with 10 cc saline flush per MD order. Small amount of thick pink tinged secretion suctioned out. Pt stable throughout with no complications. VS within normal limits.

## 2019-11-14 ENCOUNTER — Inpatient Hospital Stay: Payer: Self-pay

## 2019-11-14 LAB — BASIC METABOLIC PANEL
Anion gap: 16 — ABNORMAL HIGH (ref 5–15)
BUN: 172 mg/dL — ABNORMAL HIGH (ref 8–23)
CO2: 30 mmol/L (ref 22–32)
Calcium: 7.6 mg/dL — ABNORMAL LOW (ref 8.9–10.3)
Chloride: 100 mmol/L (ref 98–111)
Creatinine, Ser: 2.19 mg/dL — ABNORMAL HIGH (ref 0.44–1.00)
GFR calc Af Amer: 24 mL/min — ABNORMAL LOW (ref >60)
GFR calc non Af Amer: 21 mL/min — ABNORMAL LOW (ref >60)
Glucose, Bld: 245 mg/dL — ABNORMAL HIGH (ref 70–99)
Potassium: 4 mmol/L (ref 3.5–5.1)
Sodium: 146 mmol/L — ABNORMAL HIGH (ref 135–145)

## 2019-11-14 LAB — CULTURE, RESPIRATORY W GRAM STAIN
Culture: NORMAL
Special Requests: NORMAL

## 2019-11-14 LAB — CBC
HCT: 32.5 % — ABNORMAL LOW (ref 36.0–46.0)
Hemoglobin: 9.9 g/dL — ABNORMAL LOW (ref 12.0–15.0)
MCH: 24.2 pg — ABNORMAL LOW (ref 26.0–34.0)
MCHC: 30.5 g/dL (ref 30.0–36.0)
MCV: 79.5 fL — ABNORMAL LOW (ref 80.0–100.0)
Platelets: 379 10*3/uL (ref 150–400)
RBC: 4.09 MIL/uL (ref 3.87–5.11)
RDW: 14.2 % (ref 11.5–15.5)
WBC: 12.9 10*3/uL — ABNORMAL HIGH (ref 4.0–10.5)
nRBC: 9.3 % — ABNORMAL HIGH (ref 0.0–0.2)

## 2019-11-14 LAB — GLUCOSE, CAPILLARY
Glucose-Capillary: 171 mg/dL — ABNORMAL HIGH (ref 70–99)
Glucose-Capillary: 221 mg/dL — ABNORMAL HIGH (ref 70–99)
Glucose-Capillary: 231 mg/dL — ABNORMAL HIGH (ref 70–99)
Glucose-Capillary: 236 mg/dL — ABNORMAL HIGH (ref 70–99)
Glucose-Capillary: 243 mg/dL — ABNORMAL HIGH (ref 70–99)
Glucose-Capillary: 316 mg/dL — ABNORMAL HIGH (ref 70–99)

## 2019-11-14 LAB — POCT I-STAT 7, (LYTES, BLD GAS, ICA,H+H)
Acid-Base Excess: 12 mmol/L — ABNORMAL HIGH (ref 0.0–2.0)
Bicarbonate: 36.4 mmol/L — ABNORMAL HIGH (ref 20.0–28.0)
Calcium, Ion: 1.02 mmol/L — ABNORMAL LOW (ref 1.15–1.40)
HCT: 31 % — ABNORMAL LOW (ref 36.0–46.0)
Hemoglobin: 10.5 g/dL — ABNORMAL LOW (ref 12.0–15.0)
O2 Saturation: 96 %
Potassium: 3.2 mmol/L — ABNORMAL LOW (ref 3.5–5.1)
Sodium: 142 mmol/L (ref 135–145)
TCO2: 38 mmol/L — ABNORMAL HIGH (ref 22–32)
pCO2 arterial: 43.4 mmHg (ref 32.0–48.0)
pH, Arterial: 7.532 — ABNORMAL HIGH (ref 7.350–7.450)
pO2, Arterial: 73 mmHg — ABNORMAL LOW (ref 83.0–108.0)

## 2019-11-14 LAB — D-DIMER, QUANTITATIVE: D-Dimer, Quant: 0.81 ug/mL-FEU — ABNORMAL HIGH (ref 0.00–0.50)

## 2019-11-14 LAB — TRIGLYCERIDES: Triglycerides: 130 mg/dL (ref <150)

## 2019-11-14 LAB — C-REACTIVE PROTEIN: CRP: 7.7 mg/dL — ABNORMAL HIGH (ref <1.0)

## 2019-11-14 MED ORDER — VITAL 1.5 CAL PO LIQD
1000.0000 mL | ORAL | Status: DC
  Administered 2019-11-14 – 2019-11-17 (×4): 1000 mL

## 2019-11-14 MED ORDER — PRO-STAT SUGAR FREE PO LIQD
60.0000 mL | Freq: Two times a day (BID) | ORAL | Status: DC
  Administered 2019-11-14 – 2019-11-18 (×8): 60 mL
  Filled 2019-11-14 (×8): qty 60

## 2019-11-14 NOTE — Progress Notes (Addendum)
Nutrition Follow-up  DOCUMENTATION CODES:   Obesity unspecified  INTERVENTION:   Change to Vital 1.5 @ 40 ml/hr (960 ml/day) via tube  Prostat 83m BID via tube   Provides: 1840 kcal, 125 grams protein, and 733 ml free water.  Total free water: 2233  NUTRITION DIAGNOSIS:   Increased nutrient needs related to acute illness(COVID-19) as evidenced by increased estimated needs.  GOAL:   Patient will meet greater than or equal to 90% of their needs  -met with tube feeds  MONITOR:   Vent status, Labs, Weight trends, TF tolerance, Skin, I & O's  REASON FOR ASSESSMENT:   Consult, Ventilator Enteral/tube feeding initiation and management  ASSESSMENT:   Pt with PMH of Afib, CAD, HLD, HTN, DM, CKD stage 3, mild aortic stenosis, and CHF admitted with COVID-19 PNA.   Pt remains sedated and ventilated. Cortrak tube removed r/t clog and OGT tube placed. Plan is to resume tube feeds.   Per chart, pt with weight gain since admit.   Medications reviewed and include: aspirin, pepcid, heparin, insulin, solu-medrol, vitamin C, zinc, fentanyl, levophed  Labs reviewed: Na 147(H), K 4.1 wnl, BUN 137(H), creat 1.98(H), P 5.2(H), Mg 2.7(H)- 11/20 cbgs- 236, 221 231 x 24 hrs  Patient is currently intubated on ventilator support MV: 13.3 L/min Temp (24hrs), Avg:99.2 F (37.3 C), Min:93 F (33.9 C), Max:100.4 F (38 C)  Propofol: none   MAP- >675mg  UOP- 153557mDiet Order:   Diet Order            Diet NPO time specified  Diet effective now             EDUCATION NEEDS:   No education needs have been identified at this time  Skin:  Skin Assessment: Reviewed RN Assessment  Last BM:  11/18  Height:   Ht Readings from Last 1 Encounters:  11/09/19 _0  (1.575 m)    Weight:   Wt Readings from Last 1 Encounters:  11/14/19 86.9 kg    Ideal Body Weight:  50 kg  BMI:  Body mass index is 35.04 kg/m.  Estimated Nutritional Needs:   Kcal:  1600-1900  Protein:   125-150 grams  Fluid:  >1.6 L/day  CasKoleen Distance, RD, LDN Pager #- [phone]#- [phone] Hours Pager: [phone]

## 2019-11-14 NOTE — Progress Notes (Signed)
Pt unproned at this time with no complications. Pt stable throughout. VS within normal limits. Pt ett remained secured and in proper position. ETT commercial Tube holder placed and secured, no skin breakdown noted.

## 2019-11-14 NOTE — Progress Notes (Signed)
Spoke with Agricultural consultant c/o PICC line. Patient is not able to sign consent. Patient on ventilator. Contacted Dr. Vaughan Browner concerning renal functioning. Patient isn't seen by a nephrologist and no plans for graft/fistula. Will move forward with PICC placement.

## 2019-11-14 NOTE — Progress Notes (Signed)
PULMONARY / CRITICAL CARE MEDICINE   NAME:  Stephanie Hendricks, MRN:  [id number], DOB:  February 13, 1941, LOS: 93 ADMISSION DATE:  11/12/2019, CONSULTATION DATE:  11/07/19 REFERRING MD:  Triad   CHIEF COMPLAINT:  Sob   BRIEF HISTORY:    78 yo female brought to ER with dyspnea, weakness, fever 101.23F while living with house mate that tested positive for COVID 19.  Noted to have hypoxia and pulmonary infiltrates on CXR.  COVID Positive and transferred to [facility] ICU due to increasing O2 needs.       SIGNIFICANT PAST MEDICAL HISTORY    Post op A fib, CAD, HLD, HTN, DM, CKD 3, mild aortic stenosis, diastolic CHF  SIGNIFICANT EVENTS:  11/12 Admit 11/14 Transfer to [facility] 11/20 Proned   STUDIES:     CULTURES:  SARS CoV2 11/12 >> positive Blood 11/12 >>   COVID Therapy:  Remdesivir 11/13 >> 11/17 Solumedrol 11/14 >>   ANTIBIOTICS:    LINES/TUBES:  ETT 11/14>>>  CONSULTANTS:    SUBJECTIVE:   Proned yesterday as P/F ratio was poor.  Objective   Blood pressure (!) 110/52, pulse 65, temperature 98.6 F (37 C), temperature source Axillary, resp. rate (!) 0, height 5\' 2"  (1.575 m), weight 84.2 kg, SpO2 94 %.    Vent Mode: PRVC FiO2 (%):  [50 %-75 %] 75 % Set Rate:  [35 bmp] 35 bmp Vt Set:  [350 mL] 350 mL PEEP:  [12 cmH20] 12 cmH20 Plateau Pressure:  [28 cmH20-31 cmH20] 28 cmH20   Intake/Output Summary (Last 24 hours) at 11/13/2019 0843 Last data filed at 11/13/2019 0615 Gross per 24 hour  Intake 3642.16 ml  Output 3050 ml  Net 592.16 ml   Filed Weights   11/10/19 0459 11/11/19 0500 11/12/19 0500  Weight: 85.5 kg 85.3 kg 84.2 kg     PHYSICAL EXAM: Gen:      No acute distress HEENT:  EOMI, sclera anicteric Neck:     No masses; no thyromegaly, ETT Lungs:    Clear to auscultation bilaterally; normal respiratory effort CV:         Regular rate and rhythm; no murmurs Abd:      + bowel sounds; soft, non-tender; no palpable masses, no distension Ext:    No edema; adequate peripheral  perfusion Skin:      Warm and dry; no rash Neuro: Sedated  RESOLVED PROBLEMS:    ASSESSMENT AND PLAN:   Acute hypoxic respiratory failure from COVID 19 pneumonia, requiring intubation and mechanical ventilation Remdesivir course completed 11/17 Continue steroids. Repeat ABG today and determine if we need to prone again.  Levophed for BP support  Hx of CAD, HTN, HLD, chronic diastolic CHF. Continue aspirin, Lipitor, holding Lopressor, Catapres Hold Lasix as BUN/creatinine is increasing. Free water for hypernatremia  Steroid induced hyperglycemia. SSI, levemir Increase novolog dose  Nutrition: Tube feeds per nutrition  CKD 3. Follow cr Holding lasix as cr is higher  HTN: Hold clonidine and norvasc  Goals of care Called her home and spoke to [place] (her friend). Ongoing discussion on goals of care Family want to keep on support with full code for now.  BEST PRACTICE:  Diet: Tube feeds DVT prophylaxis: SQ heparin SUP: Pepcid Mobility: BR Goals of care: Full code.  Disposition: ICU  LABS   CMP Latest Ref Rng & Units 11/13/2019 11/13/2019 11/13/2019  Glucose 70 - 99 mg/dL - - 379(H)  BUN 8 - 23 mg/dL - - 137(H)  Creatinine 0.44 - 1.00 mg/dL - -  1.98(H)  Sodium 135 - 145 mmol/L 147(H) 145 149(H)  Potassium 3.5 - 5.1 mmol/L 4.1 3.3(L) 3.5  Chloride 98 - 111 mmol/L - - 97(L)  CO2 22 - 32 mmol/L - - 36(H)  Calcium 8.9 - 10.3 mg/dL - - 8.5(L)  Total Protein 6.5 - 8.1 g/dL - - -  Total Bilirubin 0.3 - 1.2 mg/dL - - -  Alkaline Phos 38 - 126 U/L - - -  AST 15 - 41 U/L - - -  ALT 0 - 44 U/L - - -    CBC Latest Ref Rng & Units 11/13/2019 11/13/2019 11/13/2019  WBC 4.0 - 10.5 K/uL - - 13.7(H)  Hemoglobin 12.0 - 15.0 g/dL 11.6(L) 11.6(L) 11.4(L)  Hematocrit 36.0 - 46.0 % 34.0(L) 34.0(L) 38.0  Platelets 150 - 400 K/uL - - 420(H)    ABG    Component Value Date/Time   PHART 7.465 (H) 11/13/2019 1451   PCO2ART 58.6 (H) 11/13/2019 1451   PO2ART 80.0 (L)  11/13/2019 1451   HCO3 42.1 (H) 11/13/2019 1451   TCO2 44 (H) 11/13/2019 1451   ACIDBASEDEF 1.0 02/19/2017 1036   O2SAT 96.0 11/13/2019 1451    CBG (last 3)  Recent Labs    11/14/19 0040 11/14/19 0354 11/14/19 0751  GLUCAP 236* 221* 231*       Component Value Date/Time   CRP 7.7 (H) 11/14/2019 0345   CRP 8.4 (H) 11/13/2019 0333   CRP 7.2 (H) 11/12/2019 0436   The patient is critically ill with multiple organ system failure and requires high complexity decision making for assessment and support, frequent evaluation and titration of therapies, advanced monitoring, review of radiographic studies and interpretation of complex data.   Critical Care Time devoted to patient care services, exclusive of separately billable procedures, described in this note is 35 minutes.   Marshell Garfinkel MD [facility] Pager [phone] If no answer call [phone] 11/14/2019, 8:52 AM

## 2019-11-15 LAB — GLUCOSE, CAPILLARY
Glucose-Capillary: 171 mg/dL — ABNORMAL HIGH (ref 70–99)
Glucose-Capillary: 199 mg/dL — ABNORMAL HIGH (ref 70–99)
Glucose-Capillary: 211 mg/dL — ABNORMAL HIGH (ref 70–99)
Glucose-Capillary: 233 mg/dL — ABNORMAL HIGH (ref 70–99)
Glucose-Capillary: 244 mg/dL — ABNORMAL HIGH (ref 70–99)
Glucose-Capillary: 252 mg/dL — ABNORMAL HIGH (ref 70–99)

## 2019-11-15 LAB — POCT I-STAT 7, (LYTES, BLD GAS, ICA,H+H)
Acid-Base Excess: 11 mmol/L — ABNORMAL HIGH (ref 0.0–2.0)
Bicarbonate: 39.7 mmol/L — ABNORMAL HIGH (ref 20.0–28.0)
Calcium, Ion: 1.08 mmol/L — ABNORMAL LOW (ref 1.15–1.40)
HCT: 31 % — ABNORMAL LOW (ref 36.0–46.0)
Hemoglobin: 10.5 g/dL — ABNORMAL LOW (ref 12.0–15.0)
O2 Saturation: 93 %
Patient temperature: 36.9
Potassium: 3.7 mmol/L (ref 3.5–5.1)
Sodium: 145 mmol/L (ref 135–145)
TCO2: 42 mmol/L — ABNORMAL HIGH (ref 22–32)
pCO2 arterial: 73.8 mmHg (ref 32.0–48.0)
pH, Arterial: 7.339 — ABNORMAL LOW (ref 7.350–7.450)
pO2, Arterial: 73 mmHg — ABNORMAL LOW (ref 83.0–108.0)

## 2019-11-15 LAB — TROPONIN I (HIGH SENSITIVITY)
Troponin I (High Sensitivity): 74 ng/L — ABNORMAL HIGH (ref <18)
Troponin I (High Sensitivity): 72 ng/L — ABNORMAL HIGH (ref <18)

## 2019-11-15 LAB — BASIC METABOLIC PANEL
Anion gap: 13 (ref 5–15)
BUN: 176 mg/dL — ABNORMAL HIGH (ref 8–23)
CO2: 32 mmol/L (ref 22–32)
Calcium: 7.7 mg/dL — ABNORMAL LOW (ref 8.9–10.3)
Chloride: 102 mmol/L (ref 98–111)
Creatinine, Ser: 2.13 mg/dL — ABNORMAL HIGH (ref 0.44–1.00)
GFR calc Af Amer: 25 mL/min — ABNORMAL LOW (ref >60)
GFR calc non Af Amer: 21 mL/min — ABNORMAL LOW (ref >60)
Glucose, Bld: 194 mg/dL — ABNORMAL HIGH (ref 70–99)
Potassium: 3.5 mmol/L (ref 3.5–5.1)
Sodium: 147 mmol/L — ABNORMAL HIGH (ref 135–145)

## 2019-11-15 LAB — PHOSPHORUS: Phosphorus: 4.5 mg/dL (ref 2.5–4.6)

## 2019-11-15 LAB — CBC
HCT: 28.9 % — ABNORMAL LOW (ref 36.0–46.0)
Hemoglobin: 9 g/dL — ABNORMAL LOW (ref 12.0–15.0)
MCH: 24.2 pg — ABNORMAL LOW (ref 26.0–34.0)
MCHC: 31.1 g/dL (ref 30.0–36.0)
MCV: 77.7 fL — ABNORMAL LOW (ref 80.0–100.0)
Platelets: 345 10*3/uL (ref 150–400)
RBC: 3.72 MIL/uL — ABNORMAL LOW (ref 3.87–5.11)
RDW: 13.9 % (ref 11.5–15.5)
WBC: 17 10*3/uL — ABNORMAL HIGH (ref 4.0–10.5)
nRBC: 13.1 % — ABNORMAL HIGH (ref 0.0–0.2)

## 2019-11-15 LAB — TRIGLYCERIDES: Triglycerides: 140 mg/dL (ref <150)

## 2019-11-15 LAB — PROTIME-INR
INR: 1.3 — ABNORMAL HIGH (ref 0.8–1.2)
Prothrombin Time: 15.6 seconds — ABNORMAL HIGH (ref 11.4–15.2)

## 2019-11-15 LAB — APTT: aPTT: 68 seconds — ABNORMAL HIGH (ref 24–36)

## 2019-11-15 LAB — MAGNESIUM: Magnesium: 3.2 mg/dL — ABNORMAL HIGH (ref 1.7–2.4)

## 2019-11-15 MED ORDER — INSULIN DETEMIR 100 UNIT/ML ~~LOC~~ SOLN
22.0000 [IU] | Freq: Two times a day (BID) | SUBCUTANEOUS | Status: DC
  Administered 2019-11-15 – 2019-11-16 (×3): 22 [IU] via SUBCUTANEOUS
  Filled 2019-11-15 (×4): qty 0.22

## 2019-11-15 NOTE — Progress Notes (Signed)
Critical ABG values given to Dr. Vaughan Browner.  No vent changes ordered.

## 2019-11-15 NOTE — Progress Notes (Signed)
[facility] Progress Note Patient Name: Stephanie Hendricks DOB: 04/26/1941 MRN: [id number]   Date of Service  11/15/2019  HPI/Events of Note  RN called with repeat labs. Platelets and hemoglobin ok. PTT 68. Unclear baseline. RN noted the bleeding from [place] injection sites is enough to soak through sheets  eICU Interventions  Hold the 10 pm dose of [place] heparin Check labs at 5 am with repeat ptt RN will call back with labs in AM and we will decide if further doses to be held or not      Intervention Category Major Interventions: Hemorrhage - evaluation and management Minor Interventions: Clinical assessment - ordering diagnostic tests  Margaretmary Lombard 11/15/2019, 11:50 PM

## 2019-11-15 NOTE — Progress Notes (Signed)
Updated patients family Jeneen Rinks and daughter in Sports coach. Ellamae Sia

## 2019-11-15 NOTE — Progress Notes (Signed)
Continuous oozing noted from subq injection sites on arms, legs and abdomen, enough to bleed through dressing and saturate sheet/gown. Elink notified, orders received to hold heparin and obtain labs.

## 2019-11-15 NOTE — Progress Notes (Addendum)
PULMONARY / CRITICAL CARE MEDICINE   NAME:  Stephanie Hendricks, MRN:  [id number], DOB:  10-11-1941, LOS: 31 ADMISSION DATE:  11/16/2019, CONSULTATION DATE:  11/07/19 REFERRING MD:  Triad   CHIEF COMPLAINT:  Sob   BRIEF HISTORY:    78 yo female brought to ER with dyspnea, weakness, fever 101.65F while living with house mate that tested positive for COVID 19.  Noted to have hypoxia and pulmonary infiltrates on CXR.  COVID Positive and transferred to [facility] ICU due to increasing O2 needs.       SIGNIFICANT PAST MEDICAL HISTORY    Post op A fib, CAD, HLD, HTN, DM, CKD 3, mild aortic stenosis, diastolic CHF  SIGNIFICANT EVENTS:  11/12 Admit 11/14 Transfer to [facility] 11/20 Proned   STUDIES:     CULTURES:  SARS CoV2 11/12 >> positive Blood 11/12 >>   COVID Therapy:  Remdesivir 11/13 >> 11/17 Solumedrol 11/14 >>   ANTIBIOTICS:    LINES/TUBES:  ETT 11/14>>>  CONSULTANTS:    SUBJECTIVE:  Started on low-dose norepinephrine yesterday.  Remains on the ventilator  Objective   Blood pressure (!) 110/52, pulse 65, temperature 98.6 F (37 C), temperature source Axillary, resp. rate (!) 0, height 5\' 2"  (1.575 m), weight 84.2 kg, SpO2 94 %.    Vent Mode: PRVC FiO2 (%):  [50 %-75 %] 75 % Set Rate:  [35 bmp] 35 bmp Vt Set:  [350 mL] 350 mL PEEP:  [12 cmH20] 12 cmH20 Plateau Pressure:  [28 cmH20-31 cmH20] 28 cmH20   Intake/Output Summary (Last 24 hours) at 11/13/2019 0843 Last data filed at 11/13/2019 0615 Gross per 24 hour  Intake 3642.16 ml  Output 3050 ml  Net 592.16 ml   Filed Weights   11/10/19 0459 11/11/19 0500 11/12/19 0500  Weight: 85.5 kg 85.3 kg 84.2 kg     PHYSICAL EXAM: Gen:      No acute distress HEENT:  EOMI, sclera anicteric Neck:     No masses; no thyromegaly, ETT Lungs:    Clear to auscultation bilaterally; normal respiratory effort CV:         Regular rate and rhythm; no murmurs Abd:      + bowel sounds; soft, non-tender; no palpable masses, no distension Ext:     No edema; adequate peripheral perfusion Skin:      Warm and dry; no rash Neuro: Sedated  RESOLVED PROBLEMS:    ASSESSMENT AND PLAN:   Acute hypoxic respiratory failure from COVID 19 pneumonia, requiring intubation and mechanical ventilation Continue steroids. Reduce TV to 6cc as Plt is high Maintain PEEP for driving pressure of < 15 Levophed for BP support > weaning to off  Hx of CAD, HTN, HLD, chronic diastolic CHF. Continue aspirin, Lipitor, holding Lopressor, Catapres, norvasc  Steroid induced hyperglycemia. SSI, levemir NovoLog Increase Levemir dose  Nutrition: Tube feeds per nutrition  CKD 3. Holding Lasix as BUN/creatinine is increasing.  May need nephrology consult Free water for hypernatremia  Goals of care Called her home and spoke to [place] (her friend and family contact). No answer from son Jeneen Rinks phone 11/22. Ongoing discussion on goals of care. . Family want to keep on support with full code for now.  BEST PRACTICE:  Diet: Tube feeds DVT prophylaxis: SQ heparin SUP: Pepcid Mobility: BR Goals of care: Full code.  Disposition: ICU  LABS   CMP Latest Ref Rng & Units 11/15/2019 11/14/2019 11/14/2019  Glucose 70 - 99 mg/dL 194(H) - 245(H)  BUN 8 - 23  mg/dL 176(H) - 172(H)  Creatinine 0.44 - 1.00 mg/dL 2.13(H) - 2.19(H)  Sodium 135 - 145 mmol/L 147(H) 142 146(H)  Potassium 3.5 - 5.1 mmol/L 3.5 3.2(L) 4.0  Chloride 98 - 111 mmol/L 102 - 100  CO2 22 - 32 mmol/L 32 - 30  Calcium 8.9 - 10.3 mg/dL 7.7(L) - 7.6(L)  Total Protein 6.5 - 8.1 g/dL - - -  Total Bilirubin 0.3 - 1.2 mg/dL - - -  Alkaline Phos 38 - 126 U/L - - -  AST 15 - 41 U/L - - -  ALT 0 - 44 U/L - - -    CBC Latest Ref Rng & Units 11/15/2019 11/14/2019 11/14/2019  WBC 4.0 - 10.5 K/uL 17.0(H) - 12.9(H)  Hemoglobin 12.0 - 15.0 g/dL 9.0(L) 10.5(L) 9.9(L)  Hematocrit 36.0 - 46.0 % 28.9(L) 31.0(L) 32.5(L)  Platelets 150 - 400 K/uL 345 - 379    ABG    Component Value Date/Time    PHART 7.532 (H) 11/14/2019 1445   PCO2ART 43.4 11/14/2019 1445   PO2ART 73.0 (L) 11/14/2019 1445   HCO3 36.4 (H) 11/14/2019 1445   TCO2 38 (H) 11/14/2019 1445   ACIDBASEDEF 1.0 02/19/2017 1036   O2SAT 96.0 11/14/2019 1445    CBG (last 3)  Recent Labs    11/14/19 2349 11/15/19 0335 11/15/19 0732  GLUCAP 171* 199* 211*       Component Value Date/Time   CRP 7.7 (H) 11/14/2019 0345   CRP 8.4 (H) 11/13/2019 0333   CRP 7.2 (H) 11/12/2019 0436   The patient is critically ill with multiple organ system failure and requires high complexity decision making for assessment and support, frequent evaluation and titration of therapies, advanced monitoring, review of radiographic studies and interpretation of complex data.   Critical Care Time devoted to patient care services, exclusive of separately billable procedures, described in this note is 35 minutes.   Marshell Garfinkel MD [facility] Pager [phone] If no answer call [phone] 11/15/2019, 8:38 AM

## 2019-11-15 NOTE — Plan of Care (Signed)
Patient Summary: No adverse events noted throughout the night. Patient remained on the ventilator throughout the night. No acute distress noted. No changes to the vent settings made. See MAR re: sedation and pressors with titration and all other medications administered. See flowsheet re: assessment. All lines and tubes remain intact. Patient remained >95% O2 sat throughout the night. Patient remained SB HR 40's-50's throughout the night. Bed in low locked position.  Problem: Education: Goal: Knowledge of General Education information will improve Description: Including pain rating scale, medication(s)/side effects and non-pharmacologic comfort measures Outcome: Progressing   Problem: Health Behavior/Discharge Planning: Goal: Ability to manage health-related needs will improve Outcome: Progressing   Problem: Clinical Measurements: Goal: Ability to maintain clinical measurements within normal limits will improve Outcome: Progressing Goal: Will remain free from infection Outcome: Progressing Goal: Diagnostic test results will improve Outcome: Progressing Goal: Respiratory complications will improve Outcome: Progressing Goal: Cardiovascular complication will be avoided Outcome: Progressing   Problem: Activity: Goal: Risk for activity intolerance will decrease Outcome: Progressing   Problem: Nutrition: Goal: Adequate nutrition will be maintained Outcome: Progressing   Problem: Coping: Goal: Level of anxiety will decrease Outcome: Progressing   Problem: Elimination: Goal: Will not experience complications related to bowel motility Outcome: Progressing Goal: Will not experience complications related to urinary retention Outcome: Progressing   Problem: Pain Managment: Goal: General experience of comfort will improve Outcome: Progressing   Problem: Safety: Goal: Ability to remain free from injury will improve Outcome: Progressing   Problem: Skin Integrity: Goal: Risk for  impaired skin integrity will decrease Outcome: Progressing   Problem: Education: Goal: Knowledge of risk factors and measures for prevention of condition will improve Outcome: Progressing   Problem: Coping: Goal: Psychosocial and spiritual needs will be supported Outcome: Progressing   Problem: Respiratory: Goal: Will maintain a patent airway Outcome: Progressing Goal: Complications related to the disease process, condition or treatment will be avoided or minimized Outcome: Progressing

## 2019-11-15 NOTE — Progress Notes (Signed)
eLink Physician-Brief Progress Note Patient Name: Stephanie Hendricks DOB: 04-17-1941 MRN: [id number]   Date of Service  11/15/2019  HPI/Events of Note  RN notified us of oozing from subcutaneous heparin injection sites. No recent pt/ptt noted. Will check coags and a CBC and re-assess  eICU Interventions  Ordered labs and RN to hold the heparin till these result , please inform results     Intervention Category Major Interventions: Hemorrhage - evaluation and management  Margaretmary Lombard 11/15/2019, 9:52 PM

## 2019-11-16 ENCOUNTER — Inpatient Hospital Stay (HOSPITAL_COMMUNITY): Payer: Medicare Other

## 2019-11-16 DIAGNOSIS — L8990 Pressure ulcer of unspecified site, unspecified stage: Secondary | ICD-10-CM | POA: Insufficient documentation

## 2019-11-16 LAB — TRIGLYCERIDES: Triglycerides: 316 mg/dL — ABNORMAL HIGH (ref <150)

## 2019-11-16 LAB — CBC
HCT: 29.9 % — ABNORMAL LOW (ref 36.0–46.0)
HCT: 29.1 % — ABNORMAL LOW (ref 36.0–46.0)
Hemoglobin: 9 g/dL — ABNORMAL LOW (ref 12.0–15.0)
Hemoglobin: 8.8 g/dL — ABNORMAL LOW (ref 12.0–15.0)
MCH: 24.3 pg — ABNORMAL LOW (ref 26.0–34.0)
MCH: 24.6 pg — ABNORMAL LOW (ref 26.0–34.0)
MCHC: 30.1 g/dL (ref 30.0–36.0)
MCHC: 30.2 g/dL (ref 30.0–36.0)
MCV: 80.6 fL (ref 80.0–100.0)
MCV: 81.3 fL (ref 80.0–100.0)
Platelets: 344 10*3/uL (ref 150–400)
Platelets: 318 10*3/uL (ref 150–400)
RBC: 3.71 MIL/uL — ABNORMAL LOW (ref 3.87–5.11)
RBC: 3.58 MIL/uL — ABNORMAL LOW (ref 3.87–5.11)
RDW: 14.9 % (ref 11.5–15.5)
RDW: 15.1 % (ref 11.5–15.5)
WBC: 24.2 10*3/uL — ABNORMAL HIGH (ref 4.0–10.5)
WBC: 27.4 10*3/uL — ABNORMAL HIGH (ref 4.0–10.5)
nRBC: 9.9 % — ABNORMAL HIGH (ref 0.0–0.2)
nRBC: 9.2 % — ABNORMAL HIGH (ref 0.0–0.2)

## 2019-11-16 LAB — CBC WITH DIFFERENTIAL/PLATELET
Abs Immature Granulocytes: 1.11 10*3/uL — ABNORMAL HIGH (ref 0.00–0.07)
Basophils Absolute: 0.1 10*3/uL (ref 0.0–0.1)
Basophils Relative: 1 %
Eosinophils Absolute: 0 10*3/uL (ref 0.0–0.5)
Eosinophils Relative: 0 %
HCT: 33.1 % — ABNORMAL LOW (ref 36.0–46.0)
Hemoglobin: 10 g/dL — ABNORMAL LOW (ref 12.0–15.0)
Immature Granulocytes: 5 %
Lymphocytes Relative: 12 %
Lymphs Abs: 2.5 10*3/uL (ref 0.7–4.0)
MCH: 24.4 pg — ABNORMAL LOW (ref 26.0–34.0)
MCHC: 30.2 g/dL (ref 30.0–36.0)
MCV: 80.7 fL (ref 80.0–100.0)
Monocytes Absolute: 1.5 10*3/uL — ABNORMAL HIGH (ref 0.1–1.0)
Monocytes Relative: 7 %
Neutro Abs: 16.8 10*3/uL — ABNORMAL HIGH (ref 1.7–7.7)
Neutrophils Relative %: 75 %
Platelets: 373 10*3/uL (ref 150–400)
RBC: 4.1 MIL/uL (ref 3.87–5.11)
RDW: 15.1 % (ref 11.5–15.5)
WBC: 22.1 10*3/uL — ABNORMAL HIGH (ref 4.0–10.5)
nRBC: 8.5 % — ABNORMAL HIGH (ref 0.0–0.2)

## 2019-11-16 LAB — POCT I-STAT 7, (LYTES, BLD GAS, ICA,H+H)
Acid-Base Excess: 9 mmol/L — ABNORMAL HIGH (ref 0.0–2.0)
Bicarbonate: 36.3 mmol/L — ABNORMAL HIGH (ref 20.0–28.0)
Calcium, Ion: 0.99 mmol/L — ABNORMAL LOW (ref 1.15–1.40)
HCT: 30 % — ABNORMAL LOW (ref 36.0–46.0)
Hemoglobin: 10.2 g/dL — ABNORMAL LOW (ref 12.0–15.0)
O2 Saturation: 82 %
Patient temperature: 36.91
Potassium: 3.9 mmol/L (ref 3.5–5.1)
Sodium: 145 mmol/L (ref 135–145)
TCO2: 38 mmol/L — ABNORMAL HIGH (ref 22–32)
pCO2 arterial: 65.3 mmHg (ref 32.0–48.0)
pH, Arterial: 7.352 (ref 7.350–7.450)
pO2, Arterial: 50 mmHg — ABNORMAL LOW (ref 83.0–108.0)

## 2019-11-16 LAB — BASIC METABOLIC PANEL
Anion gap: 15 (ref 5–15)
Anion gap: 14 (ref 5–15)
BUN: 195 mg/dL — ABNORMAL HIGH (ref 8–23)
BUN: 214 mg/dL — ABNORMAL HIGH (ref 8–23)
CO2: 32 mmol/L (ref 22–32)
CO2: 32 mmol/L (ref 22–32)
Calcium: 7.4 mg/dL — ABNORMAL LOW (ref 8.9–10.3)
Calcium: 7.3 mg/dL — ABNORMAL LOW (ref 8.9–10.3)
Chloride: 100 mmol/L (ref 98–111)
Chloride: 102 mmol/L (ref 98–111)
Creatinine, Ser: 2.06 mg/dL — ABNORMAL HIGH (ref 0.44–1.00)
Creatinine, Ser: 2.11 mg/dL — ABNORMAL HIGH (ref 0.44–1.00)
GFR calc Af Amer: 26 mL/min — ABNORMAL LOW (ref >60)
GFR calc Af Amer: 25 mL/min — ABNORMAL LOW (ref >60)
GFR calc non Af Amer: 22 mL/min — ABNORMAL LOW (ref >60)
GFR calc non Af Amer: 22 mL/min — ABNORMAL LOW (ref >60)
Glucose, Bld: 346 mg/dL — ABNORMAL HIGH (ref 70–99)
Glucose, Bld: 531 mg/dL (ref 70–99)
Potassium: 4 mmol/L (ref 3.5–5.1)
Potassium: 4.2 mmol/L (ref 3.5–5.1)
Sodium: 147 mmol/L — ABNORMAL HIGH (ref 135–145)
Sodium: 148 mmol/L — ABNORMAL HIGH (ref 135–145)

## 2019-11-16 LAB — GLUCOSE, CAPILLARY
Glucose-Capillary: 319 mg/dL — ABNORMAL HIGH (ref 70–99)
Glucose-Capillary: 346 mg/dL — ABNORMAL HIGH (ref 70–99)
Glucose-Capillary: 377 mg/dL — ABNORMAL HIGH (ref 70–99)
Glucose-Capillary: 378 mg/dL — ABNORMAL HIGH (ref 70–99)
Glucose-Capillary: 383 mg/dL — ABNORMAL HIGH (ref 70–99)
Glucose-Capillary: 383 mg/dL — ABNORMAL HIGH (ref 70–99)
Glucose-Capillary: 441 mg/dL — ABNORMAL HIGH (ref 70–99)
Glucose-Capillary: 456 mg/dL — ABNORMAL HIGH (ref 70–99)
Glucose-Capillary: 473 mg/dL — ABNORMAL HIGH (ref 70–99)
Glucose-Capillary: 483 mg/dL — ABNORMAL HIGH (ref 70–99)
Glucose-Capillary: 499 mg/dL — ABNORMAL HIGH (ref 70–99)

## 2019-11-16 LAB — PHOSPHORUS: Phosphorus: 6.6 mg/dL — ABNORMAL HIGH (ref 2.5–4.6)

## 2019-11-16 LAB — APTT: aPTT: 50 seconds — ABNORMAL HIGH (ref 24–36)

## 2019-11-16 LAB — MAGNESIUM: Magnesium: 3.9 mg/dL — ABNORMAL HIGH (ref 1.7–2.4)

## 2019-11-16 MED ORDER — CHLORHEXIDINE GLUCONATE 0.12 % MT SOLN
15.0000 mL | Freq: Two times a day (BID) | OROMUCOSAL | Status: DC
  Administered 2019-11-16 – 2019-11-18 (×4): 15 mL via OROMUCOSAL
  Filled 2019-11-16: qty 15

## 2019-11-16 MED ORDER — INSULIN DETEMIR 100 UNIT/ML ~~LOC~~ SOLN
30.0000 [IU] | Freq: Two times a day (BID) | SUBCUTANEOUS | Status: DC
  Filled 2019-11-16: qty 0.3

## 2019-11-16 MED ORDER — DEXTROSE 50 % IV SOLN: 0.0000 mL | INTRAVENOUS | Status: DC | PRN

## 2019-11-16 MED ORDER — HYDRALAZINE HCL 20 MG/ML IJ SOLN
10.0000 mg | INTRAMUSCULAR | Status: DC | PRN
  Administered 2019-11-17: 07:00:00 10 mg via INTRAVENOUS
  Filled 2019-11-16: qty 1

## 2019-11-16 MED ORDER — FUROSEMIDE 10 MG/ML IJ SOLN
80.0000 mg | Freq: Once | INTRAMUSCULAR | Status: AC
  Administered 2019-11-16: 80 mg via INTRAVENOUS
  Filled 2019-11-16: qty 8

## 2019-11-16 MED ORDER — MIDAZOLAM HCL 2 MG/2ML IJ SOLN
2.0000 mg | INTRAMUSCULAR | Status: DC | PRN
  Administered 2019-11-17 – 2019-11-18 (×2): 2 mg via INTRAVENOUS
  Filled 2019-11-16 (×2): qty 2

## 2019-11-16 MED ORDER — SODIUM CHLORIDE 0.9 % IV SOLN
INTRAVENOUS | Status: DC
  Administered 2019-11-16: 22:00:00 via INTRAVENOUS

## 2019-11-16 MED ORDER — INSULIN ASPART 100 UNIT/ML ~~LOC~~ SOLN
12.0000 [IU] | SUBCUTANEOUS | Status: DC
  Administered 2019-11-16: 17:00:00 12 [IU] via SUBCUTANEOUS

## 2019-11-16 MED ORDER — "THROMBI-PAD 3""X3"" EX PADS"
1.0000 | MEDICATED_PAD | Freq: Once | CUTANEOUS | Status: AC
  Administered 2019-11-16: 1 via TOPICAL
  Filled 2019-11-16: qty 1

## 2019-11-16 MED ORDER — INSULIN REGULAR(HUMAN) IN NACL 100-0.9 UT/100ML-% IV SOLN
INTRAVENOUS | Status: DC
  Administered 2019-11-16: 14 [IU]/h via INTRAVENOUS
  Administered 2019-11-17: 22:00:00 12 [IU]/h via INTRAVENOUS
  Administered 2019-11-17: 20 [IU]/h via INTRAVENOUS
  Administered 2019-11-17: 13:00:00 14 [IU]/h via INTRAVENOUS
  Administered 2019-11-17: 07:00:00 17 [IU]/h via INTRAVENOUS
  Administered 2019-11-18: 13:00:00 5.5 [IU]/h via INTRAVENOUS
  Filled 2019-11-16: qty 300

## 2019-11-16 MED ORDER — LABETALOL HCL 5 MG/ML IV SOLN
20.0000 mg | INTRAVENOUS | Status: DC | PRN
  Administered 2019-11-16 – 2019-11-17 (×2): 20 mg via INTRAVENOUS
  Filled 2019-11-16 (×2): qty 4

## 2019-11-16 MED ORDER — METHYLPREDNISOLONE SODIUM SUCC 40 MG IJ SOLR
20.0000 mg | Freq: Every day | INTRAMUSCULAR | Status: DC
  Administered 2019-11-17: 10:00:00 20 mg via INTRAVENOUS
  Filled 2019-11-16: qty 1

## 2019-11-16 NOTE — Progress Notes (Signed)
Notified Elink of CBG 499, awaiting orders

## 2019-11-16 NOTE — Progress Notes (Signed)
NAME:  Stephanie Hendricks, MRN:  [id number], DOB:  1941-06-13, LOS: 51 ADMISSION DATE:  10/28/2019, CONSULTATION DATE:  11/14 REFERRING MD:  Triad, CHIEF COMPLAINT:  Dyspnea   Brief History   78 y/o female admitted on 11/12 with SARS COV 2 pneumonia, intubated 11/14.     Past Medical History  Post op A fib, CAD, HLD, HTN, DM, CKD 3, mild aortic stenosis, diastolic CHF  Significant Hospital Events   11/12 Admit 11/14 Transfer to [facility] 11/20 Proned   Consults:    Procedures:  ETT 11/14>   Significant Diagnostic Tests:    Micro Data:  SARS CoV2 11/12 >> positive Blood 11/12 >>   Antimicrobials/COVID Rx  Remdesivir 11/13 >> 11/17 Solumedrol 11/14 >>   Interim history/subjective:  oozing overnight from heparin injection sites Some bleeding from ETT  Objective   Blood pressure (!) 165/55, pulse 68, temperature (!) 97.5 F (36.4 C), temperature source Axillary, resp. rate (!) 31, height 5\' 2"  (1.575 m), weight 85.6 kg, SpO2 98 %.    Vent Mode: PRVC FiO2 (%):  [50 %-80 %] 80 % Set Rate:  [35 bmp] 35 bmp Vt Set:  [300 mL] 300 mL PEEP:  [14 cmH20] 14 cmH20 Plateau Pressure:  [26 cmH20-29 cmH20] 28 cmH20   Intake/Output Summary (Last 24 hours) at 11/16/2019 1003 Last data filed at 11/16/2019 [id number] Gross per 24 hour  Intake 2462.74 ml  Output 1150 ml  Net 1312.74 ml   Filed Weights   11/12/19 0500 11/14/19 0500 11/15/19 0500  Weight: 84.2 kg 86.9 kg 85.6 kg    Examination:  General:  In bed on vent HENT: NCAT ETT in place PULM: CTA B, vent supported breathing CV: RRR, no mgr GI: BS+, soft, nontender MSK: normal bulk and tone Neuro: sedated on vent   11/23 CXR images personally reviewed, bibasilar infiltrates, otherwise clear, ETT in place  Resolved Hospital Problem list     Assessment & Plan:  ARDS COVID 19 pneumonia: she has made very little progress in 9 days on a ventilator.  At age 29 with multiple comorbid illnesses, the likelihood of survival is near  zero at this point Continue mechanical ventilation per ARDS protocol Target TVol 6-8cc/kgIBW Target Plateau Pressure < 30cm H20 Target driving pressure less than 15 cm of water Target PaO2 55-65: titrate PEEP/FiO2 per protocol As long as PaO2 to FiO2 ratio is less than 1:150 position in prone position for 16 hours a day Check CVP daily if CVL in place Target CVP less than 4, diurese as necessary Ventilator associated pneumonia prevention protocol 11/23 goals of care conversations, Decrease solumedrol, furosemide x1 dose  Need for sedation:Heavily sedated 11/23 Wean sedation for RASS -1 to see if she is able to wake up/respond Minimize fentanyl infusion  CAD, HTN, HLD, diastolic CHF Furosemide x1 dose overnight  Hyperglycemia SSI, detemir per COVID glycemic protocol  Nutrition Tube feeding  CKD Monitor BMET and UOP Replace electrolytes as needed   Bleeding from injection sites Heparin on hold today Monitor Hgb SCD  Goals of care : DNR, discused with daughter in law today  Best practice:  Diet: tube feeding Pain/Anxiety/Delirium protocol (if indicated): as above VAP protocol (if indicated): yes DVT prophylaxis: scd GI prophylaxis: famotidine Glucose control: ssi, detemir Mobility: bed rest Code Status: DNR Family Communication: updated family on 11/23, advised withdrawal of care, will follow up with them on 11/24, may need palliative care consultatoin Disposition: remain in ICU  Labs   CBC: Recent Labs  Lab 11/13/19 0333  11/14/19 0345  11/15/19 0120 11/15/19 1314 11/15/19 2140 11/16/19 0350 11/16/19 0445  WBC 13.7*  --  12.9*  --  17.0*  --  22.1*  --  24.2*  NEUTROABS  --   --   --   --   --   --  16.8*  --   --   HGB 11.4*   < > 9.9*   < > 9.0* 10.5* 10.0* 10.2* 9.0*  HCT 38.0   < > 32.5*   < > 28.9* 31.0* 33.1* 30.0* 29.9*  MCV 78.8*  --  79.5*  --  77.7*  --  80.7  --  80.6  PLT 420*  --  379  --  345  --  373  --  344   < > = values in this  interval not displayed.    Basic Metabolic Panel: Recent Labs  Lab 11/11/19 0555  11/12/19 0436 11/12/19 2114 11/13/19 0333  11/14/19 0345 11/14/19 1445 11/15/19 0120 11/15/19 1314 11/16/19 0350 11/16/19 0445  NA 149*   < > 153* 150* 149*   < > 146* 142 147* 145 145 147*  K 3.8   < > 3.9 3.4* 3.5   < > 4.0 3.2* 3.5 3.7 3.9 4.0  CL 107  --  103 96* 97*  --  100  --  102  --   --  100  CO2 32  --  36*  --  36*  --  30  --  32  --   --  32  GLUCOSE 284*  --  153* 306* 379*  --  245*  --  194*  --   --  346*  BUN 84*  --  98* 107* 137*  --  172*  --  176*  --   --  195*  CREATININE 1.48*  --  1.59* 1.70* 1.98*  --  2.19*  --  2.13*  --   --  2.06*  CALCIUM 8.5*  --  9.2  --  8.5*  --  7.6*  --  7.7*  --   --  7.4*  MG 2.8*  --  2.7*  --  2.7*  --   --   --  3.2*  --   --  3.9*  PHOS 3.2  --  3.7  --  5.2*  --   --   --  4.5  --   --  6.6*   < > = values in this interval not displayed.   GFR: Estimated Creatinine Clearance: 22.1 mL/min (A) (by C-G formula based on SCr of 2.06 mg/dL (H)). Recent Labs  Lab 11/14/19 0345 11/15/19 0120 11/15/19 2140 11/16/19 0445  WBC 12.9* 17.0* 22.1* 24.2*    Liver Function Tests: Recent Labs  Lab 11/10/19 0730  AST 44*  ALT 26  ALKPHOS 65  BILITOT 0.5  PROT 6.6  ALBUMIN 2.5*   No results for input(s): LIPASE, AMYLASE in the last 168 hours. No results for input(s): AMMONIA in the last 168 hours.  ABG    Component Value Date/Time   PHART 7.352 11/16/2019 0350   PCO2ART 65.3 (HH) 11/16/2019 0350   PO2ART 50.0 (L) 11/16/2019 0350   HCO3 36.3 (H) 11/16/2019 0350   TCO2 38 (H) 11/16/2019 0350   ACIDBASEDEF 1.0 02/19/2017 1036   O2SAT 82.0 11/16/2019 0350     Coagulation Profile: Recent Labs  Lab 11/15/19 2140  INR 1.3*    Cardiac Enzymes: No results  for input(s): CKTOTAL, CKMB, CKMBINDEX, TROPONINI in the last 168 hours.  HbA1C: Hgb A1c MFr Bld  Date/Time Value Ref Range Status  11/06/2019 05:14 AM 8.7 (H) 4.8 - 5.6 %  Final    Comment:    (NOTE) Pre diabetes:          5.7%-6.4% Diabetes:              >6.4% Glycemic control for   <7.0% adults with diabetes   02/21/2017 05:00 AM 6.0 (H) 4.8 - 5.6 % Final    Comment:    (NOTE)         Pre-diabetes: 5.7 - 6.4         Diabetes: >6.4         Glycemic control for adults with diabetes: <7.0     CBG: Recent Labs  Lab 11/15/19 1637 11/15/19 1949 11/15/19 2358 11/16/19 0339 11/16/19 0803  GLUCAP 252* 244* 319* 346* 378*       Critical care time: 45 minutes    Roselie Awkward, MD [place] [facility] Pager: [phone] Cell: [phone] If no response, call [phone]

## 2019-11-16 NOTE — Progress Notes (Signed)
eLink Physician-Brief Progress Note Patient Name: Stephanie Hendricks DOB: 09-Nov-1941 MRN: [id number]   Date of Service  11/16/2019  HPI/Events of Note  Hyperglycemia close to 500, on subcutaneous insulin Also notified of bradycardia with labetalol doses  eICU Interventions  1. Insulin infusion, q4h BMP ordered 2. Hydralazine PRN ordered     Intervention Category Major Interventions: Hyperglycemia - active titration of insulin therapy;Hypertension - evaluation and management  Margaretmary Lombard 11/16/2019, 8:39 PM

## 2019-11-16 NOTE — Progress Notes (Signed)
FiO2 increased based on ABG result

## 2019-11-16 NOTE — Progress Notes (Signed)
eLink Physician-Brief Progress Note Patient Name: DACOTAH RIAL DOB: 05/30/1941 MRN: [id number]   Date of Service  11/16/2019  HPI/Events of Note  Notified of persistent bleeding on heparin injection site.  eICU Interventions  Thrombin pad ordered     Intervention Category Intermediate Interventions: Bleeding - evaluation and treatment with blood products  Judd Lien 11/16/2019, 4:05 AM

## 2019-11-16 NOTE — Progress Notes (Signed)
Inpatient Diabetes Program Recommendations  AACE/ADA: New Consensus Statement on Inpatient Glycemic Control (2015)  Target Ranges:  Prepandial:   less than 140 mg/dL      Peak postprandial:   less than 180 mg/dL (1-2 hours)      Critically ill patients:  140 - 180 mg/dL   Lab Results  Component Value Date   GLUCAP 378 (H) 11/16/2019   HGBA1C 8.7 (H) 11/06/2019    Review of Glycemic Control Results for Stephanie Hendricks, Stephanie Hendricks (MRN [id number]) as of 11/16/2019 09:37  Ref. Range 11/15/2019 23:58 11/16/2019 03:39 11/16/2019 08:03  Glucose-Capillary Latest Ref Range: 70 - 99 mg/dL 319 (H) 346 (H) 378 (H)   Diabetes history: Type 2 DM Outpatient Diabetes medications: Xultophy 38 units QD Current orders for Inpatient glycemic control: Levemir 22 units BID, Novolog 10 units Q4H, Novolog 0-20 units Q4H,  Solumedrol 40 mg BID  Inpatient Diabetes Program Recommendations:    Consider increasing Levemir 28 units BID.   Thanks, Bronson Curb, MSN, RNC-OB Diabetes Coordinator [phone] (8a-5p)

## 2019-11-17 ENCOUNTER — Other Ambulatory Visit: Payer: Self-pay

## 2019-11-17 DIAGNOSIS — N179 Acute kidney failure, unspecified: Secondary | ICD-10-CM

## 2019-11-17 LAB — GLUCOSE, CAPILLARY
Glucose-Capillary: 126 mg/dL — ABNORMAL HIGH (ref 70–99)
Glucose-Capillary: 132 mg/dL — ABNORMAL HIGH (ref 70–99)
Glucose-Capillary: 146 mg/dL — ABNORMAL HIGH (ref 70–99)
Glucose-Capillary: 153 mg/dL — ABNORMAL HIGH (ref 70–99)
Glucose-Capillary: 165 mg/dL — ABNORMAL HIGH (ref 70–99)
Glucose-Capillary: 167 mg/dL — ABNORMAL HIGH (ref 70–99)
Glucose-Capillary: 170 mg/dL — ABNORMAL HIGH (ref 70–99)
Glucose-Capillary: 175 mg/dL — ABNORMAL HIGH (ref 70–99)
Glucose-Capillary: 176 mg/dL — ABNORMAL HIGH (ref 70–99)
Glucose-Capillary: 176 mg/dL — ABNORMAL HIGH (ref 70–99)
Glucose-Capillary: 188 mg/dL — ABNORMAL HIGH (ref 70–99)
Glucose-Capillary: 208 mg/dL — ABNORMAL HIGH (ref 70–99)
Glucose-Capillary: 211 mg/dL — ABNORMAL HIGH (ref 70–99)
Glucose-Capillary: 217 mg/dL — ABNORMAL HIGH (ref 70–99)
Glucose-Capillary: 218 mg/dL — ABNORMAL HIGH (ref 70–99)
Glucose-Capillary: 238 mg/dL — ABNORMAL HIGH (ref 70–99)
Glucose-Capillary: 244 mg/dL — ABNORMAL HIGH (ref 70–99)
Glucose-Capillary: 265 mg/dL — ABNORMAL HIGH (ref 70–99)
Glucose-Capillary: 294 mg/dL — ABNORMAL HIGH (ref 70–99)
Glucose-Capillary: 310 mg/dL — ABNORMAL HIGH (ref 70–99)
Glucose-Capillary: 356 mg/dL — ABNORMAL HIGH (ref 70–99)
Glucose-Capillary: 402 mg/dL — ABNORMAL HIGH (ref 70–99)

## 2019-11-17 LAB — BASIC METABOLIC PANEL
Anion gap: 10 (ref 5–15)
Anion gap: 9 (ref 5–15)
BUN: 202 mg/dL — ABNORMAL HIGH (ref 8–23)
BUN: 186 mg/dL — ABNORMAL HIGH (ref 8–23)
CO2: 32 mmol/L (ref 22–32)
CO2: 33 mmol/L — ABNORMAL HIGH (ref 22–32)
Calcium: 7.3 mg/dL — ABNORMAL LOW (ref 8.9–10.3)
Calcium: 7.4 mg/dL — ABNORMAL LOW (ref 8.9–10.3)
Chloride: 105 mmol/L (ref 98–111)
Chloride: 106 mmol/L (ref 98–111)
Creatinine, Ser: 2.1 mg/dL — ABNORMAL HIGH (ref 0.44–1.00)
Creatinine, Ser: 1.85 mg/dL — ABNORMAL HIGH (ref 0.44–1.00)
GFR calc Af Amer: 25 mL/min — ABNORMAL LOW (ref >60)
GFR calc Af Amer: 29 mL/min — ABNORMAL LOW (ref >60)
GFR calc non Af Amer: 22 mL/min — ABNORMAL LOW (ref >60)
GFR calc non Af Amer: 25 mL/min — ABNORMAL LOW (ref >60)
Glucose, Bld: 365 mg/dL — ABNORMAL HIGH (ref 70–99)
Glucose, Bld: 262 mg/dL — ABNORMAL HIGH (ref 70–99)
Potassium: 3.6 mmol/L (ref 3.5–5.1)
Potassium: 4.2 mmol/L (ref 3.5–5.1)
Sodium: 147 mmol/L — ABNORMAL HIGH (ref 135–145)
Sodium: 148 mmol/L — ABNORMAL HIGH (ref 135–145)

## 2019-11-17 MED ORDER — FUROSEMIDE 10 MG/ML IJ SOLN
80.0000 mg | Freq: Once | INTRAMUSCULAR | Status: AC
  Administered 2019-11-17: 80 mg via INTRAVENOUS
  Filled 2019-11-17: qty 8

## 2019-11-17 MED ORDER — SENNOSIDES-DOCUSATE SODIUM 8.6-50 MG PO TABS
2.0000 | ORAL_TABLET | Freq: Two times a day (BID) | ORAL | Status: DC
  Administered 2019-11-17 (×2): 2
  Filled 2019-11-17 (×2): qty 2

## 2019-11-17 MED ORDER — DEXTROSE-NACL 5-0.45 % IV SOLN
INTRAVENOUS | Status: DC
  Administered 2019-11-17 – 2019-11-18 (×3): via INTRAVENOUS

## 2019-11-17 MED ORDER — HYDROMORPHONE BOLUS VIA INFUSION
1.0000 mg | INTRAVENOUS | Status: DC | PRN
  Administered 2019-11-17: 1 mg via INTRAVENOUS
  Filled 2019-11-17: qty 1

## 2019-11-17 MED ORDER — SODIUM CHLORIDE 0.9 % IV SOLN
1.0000 mg/h | INTRAVENOUS | Status: DC
  Administered 2019-11-17: 1 mg/h via INTRAVENOUS
  Filled 2019-11-17: qty 5

## 2019-11-17 MED ORDER — POTASSIUM CHLORIDE 20 MEQ/15ML (10%) PO SOLN
40.0000 meq | Freq: Once | ORAL | Status: AC
  Administered 2019-11-17: 40 meq
  Filled 2019-11-17: qty 30

## 2019-11-17 MED ORDER — LABETALOL HCL 5 MG/ML IV SOLN: 10.0000 mg | INTRAVENOUS | Status: DC | PRN

## 2019-11-17 NOTE — Progress Notes (Signed)
Assisted tele visit to patient with husband and family member.  Maryelizabeth Rowan, RN

## 2019-11-17 NOTE — Care Plan (Signed)
Jodelle Gross, sister. [phone]. Jeneen Rinks (son) and Tye [place] (daughter in Sports coach) [place]. [phone].

## 2019-11-17 NOTE — Progress Notes (Signed)
eLink Physician-Brief Progress Note Patient Name: YOANDRA PIETILA DOB: 04-27-1941 MRN: [id number]   Date of Service  11/17/2019  HPI/Events of Note  Notified of K 3.6 Creatinine 2.1 stable, given furosemide 80 mg earlier  eICU Interventions  Ordered K 40 meqs per feeding x 1     Intervention Category Major Interventions: Electrolyte abnormality - evaluation and management  Judd Lien 11/17/2019, 2:58 AM

## 2019-11-17 NOTE — Progress Notes (Addendum)
NAME:  Stephanie Hendricks, MRN:  [id number], DOB:  March 03, 1941, LOS: 23 ADMISSION DATE:  11/19/2019, CONSULTATION DATE:  11/14 REFERRING MD:  Triad, CHIEF COMPLAINT:  Dyspnea   Brief History   78 y/o female admitted on 11/12 with SARS COV 2 pneumonia, intubated 11/14.     Past Medical History  Post op A fib, CAD, HLD, HTN, DM, CKD 3, mild aortic stenosis, diastolic CHF  Significant Hospital Events   11/12 Admit 11/14 Transfer to [facility] 11/20 Proned   Consults:    Procedures:  ETT 11/14>   Significant Diagnostic Tests:    Micro Data:  SARS CoV2 11/12 >> positive Blood 11/12 >>Neg Final  Trachea 11/19.normal respiratory flora  Antimicrobials/COVID Rx  Remdesivir 11/13 >> 11/17 Solumedrol 11/14 >>   Interim history/subjective:  No further oozing.  Insulin infusion initiated overnight due to hyperglycemia. No BM since 11/18.  Objective   Blood pressure (!) 171/47, pulse 91, temperature 98.6 F (37 C), temperature source Oral, resp. rate (!) 36, height 5\' 2"  (1.575 m), weight 85.6 kg, SpO2 100 %.    Vent Mode: PRVC FiO2 (%):  [40 %-80 %] 40 % Set Rate:  [35 bmp] 35 bmp Vt Set:  [300 mL] 300 mL PEEP:  [14 cmH20] 14 cmH20 Plateau Pressure:  [21 cmH20-29 cmH20] 21 cmH20   Intake/Output Summary (Last 24 hours) at 11/17/2019 [id number] Last data filed at 11/17/2019 0735 Gross per 24 hour  Intake 3642.02 ml  Output 2500 ml  Net 1142.02 ml   Filed Weights   11/12/19 0500 11/14/19 0500 11/15/19 0500  Weight: 84.2 kg 86.9 kg 85.6 kg    Examination:  General: Elderly female, acutely and chronically ill-appearing, well-developed, well nourished. NAD HENT: Normocephalic, PERRL.  Neck: No JVD. Trachea midline.  CV: RRR. S1S2. 1/6 M, no RG. +2 distal pulses Lungs: BBS diminished at bases, otherwise clear.  FNL, symmetrical.  Vent supported ABD: +BS x4. SNT/ND. No masses, guarding or rigidity GU: Foley in place EXT: MAE spontaneously.  Trace edema Skin: PWD. In tact. No  rashes or lesions Neuro: Grimaces to noxious stimuli.  Does not follow commands.     No imaging today  Resolved Hospital Problem list     Assessment & Plan:  ARDS COVID 19 pneumonia: She continues to make little progress Continue mechanical ventilation per ARDS protocol Target TVol 6-8cc/kgIBW Target Plateau Pressure < 30cm H20 Target driving pressure less than 15 cm of water Target PaO2 55-65: titrate PEEP/FiO2 per protocol As long as PaO2 to FiO2 ratio is less than 1:150 position in prone position for 16 hours a day Ventilator associated pneumonia prevention protocol Continue solumedrol, furosemide x1 dose  Need for sedation: Remains heavily sedated 11/24 May be c/b uremia  Wean sedation for RASS -1 to see if she is able to wake up/respond Change to dilaudid gtt   ARF on CKDIII with uremia-improving. Responded well to one time dose lasix 11/23.  Good UO but+9L.  Weight 86.2kg on admission, down to 85.6kg today.  Slight free water deficit at 2.2 L. Continue to monitor.  No diuresis today. Continue free water.  CAD, HTN, HLD, diastolic CHF Furosemide x1 dose overnight as noted above  Hyperglycemia Required insulin infusion overnight.  No anion gap noted. Continue insulin infusion for now.  Nutrition Tube feeding recommendations    Bleeding from injection sites.  No further noted. Resume Heparin  Monitor Hgb SCD   Best practice:  Diet: tube feeding Pain/Anxiety/Delirium protocol (if indicated): as above VAP  protocol (if indicated): yes DVT prophylaxis: scd/heparin GI prophylaxis: famotidine Glucose control: Insulin infusion Mobility: bed rest Code Status: DNR Family Communication: updated patient's sister Jodelle Gross by telephone.  Subsequently updated patient's daughter-in-law Tye [place] by telephone.  They request more time to decide timing for transition to comfort care. Discussed possible palliative care to reach out.  Family wishes to discuss same.  They are  dealing with the patient's son who is also being treated for COVID-19 in the intensive care setting. I will reach out to palliative care to request them review Ms Depaul and wait before contacting family.  Disposition: remain in ICU  Labs   CBC: Recent Labs  Lab 11/14/19 0345  11/15/19 0120 11/15/19 1314 11/15/19 2140 11/16/19 0350 11/16/19 0445 11/16/19 1650  WBC 12.9*  --  17.0*  --  22.1*  --  24.2* 27.4*  NEUTROABS  --   --   --   --  16.8*  --   --   --   HGB 9.9*   < > 9.0* 10.5* 10.0* 10.2* 9.0* 8.8*  HCT 32.5*   < > 28.9* 31.0* 33.1* 30.0* 29.9* 29.1*  MCV 79.5*  --  77.7*  --  80.7  --  80.6 81.3  PLT 379  --  345  --  373  --  344 318   < > = values in this interval not displayed.    Basic Metabolic Panel: Recent Labs  Lab 11/11/19 0555  11/12/19 0436  11/13/19 0333  11/15/19 0120  11/16/19 0350 11/16/19 0445 11/16/19 2115 11/17/19 0052 11/17/19 0520  NA 149*   < > 153*   < > 149*   < > 147*   < > 145 147* 148* 147* 148*  K 3.8   < > 3.9   < > 3.5   < > 3.5   < > 3.9 4.0 4.2 3.6 4.2  CL 107  --  103   < > 97*   < > 102  --   --  100 102 105 106  CO2 32  --  36*  --  36*   < > 32  --   --  32 32 32 33*  GLUCOSE 284*  --  153*   < > 379*   < > 194*  --   --  346* 531* 365* 262*  BUN 84*  --  98*   < > 137*   < > 176*  --   --  195* 214* 202* 186*  CREATININE 1.48*  --  1.59*   < > 1.98*   < > 2.13*  --   --  2.06* 2.11* 2.10* 1.85*  CALCIUM 8.5*  --  9.2  --  8.5*   < > 7.7*  --   --  7.4* 7.3* 7.3* 7.4*  MG 2.8*  --  2.7*  --  2.7*  --  3.2*  --   --  3.9*  --   --   --   PHOS 3.2  --  3.7  --  5.2*  --  4.5  --   --  6.6*  --   --   --    < > = values in this interval not displayed.   GFR: Estimated Creatinine Clearance: 24.6 mL/min (A) (by C-G formula based on SCr of 1.85 mg/dL (H)). Recent Labs  Lab 11/15/19 0120 11/15/19 2140 11/16/19 0445 11/16/19 1650  WBC 17.0* 22.1* 24.2* 27.4*    Liver  Function Tests: No results for input(s): AST, ALT,  ALKPHOS, BILITOT, PROT, ALBUMIN in the last 168 hours. No results for input(s): LIPASE, AMYLASE in the last 168 hours. No results for input(s): AMMONIA in the last 168 hours.  ABG    Component Value Date/Time   PHART 7.352 11/16/2019 0350   PCO2ART 65.3 (HH) 11/16/2019 0350   PO2ART 50.0 (L) 11/16/2019 0350   HCO3 36.3 (H) 11/16/2019 0350   TCO2 38 (H) 11/16/2019 0350   ACIDBASEDEF 1.0 02/19/2017 1036   O2SAT 82.0 11/16/2019 0350     Coagulation Profile: Recent Labs  Lab 11/15/19 2140  INR 1.3*    Cardiac Enzymes: No results for input(s): CKTOTAL, CKMB, CKMBINDEX, TROPONINI in the last 168 hours.  HbA1C: Hgb A1c MFr Bld  Date/Time Value Ref Range Status  11/06/2019 05:14 AM 8.7 (H) 4.8 - 5.6 % Final    Comment:    (NOTE) Pre diabetes:          5.7%-6.4% Diabetes:              >6.4% Glycemic control for   <7.0% adults with diabetes   02/21/2017 05:00 AM 6.0 (H) 4.8 - 5.6 % Final    Comment:    (NOTE)         Pre-diabetes: 5.7 - 6.4         Diabetes: >6.4         Glycemic control for adults with diabetes: <7.0     CBG: Recent Labs  Lab 11/17/19 0423 11/17/19 0522 11/17/19 0637 11/17/19 0732 11/17/19 0839  GLUCAP 265* 238* 211* 218* 208*         The patient is critically ill with ARDS/Respiratory failure. She requires ICU for high complexity decision making, titration of high alert medications, ventilator management, titration of oxygen and interpretation of advanced monitoring.    I personally spent 45 minutes providing critical care services including personally reviewing test results, discussing care with nursing staff/other physicians and completing orders pertaining to this patient.  Time was exclusive to the patient and does not include time spent teaching or in procedures.  Voice recognition software was used in the production of this record.  Errors in interpretation may have been inadvertently missed during review.  Francine Graven, MSN, AGACNP   [facility]

## 2019-11-18 ENCOUNTER — Inpatient Hospital Stay (HOSPITAL_COMMUNITY): Payer: Medicare Other

## 2019-11-18 ENCOUNTER — Other Ambulatory Visit: Payer: Self-pay

## 2019-11-18 DIAGNOSIS — R570 Cardiogenic shock: Secondary | ICD-10-CM

## 2019-11-18 LAB — CBC WITH DIFFERENTIAL/PLATELET
Abs Immature Granulocytes: 2.38 10*3/uL — ABNORMAL HIGH (ref 0.00–0.07)
Basophils Absolute: 0.1 10*3/uL (ref 0.0–0.1)
Basophils Relative: 0 %
Eosinophils Absolute: 0 10*3/uL (ref 0.0–0.5)
Eosinophils Relative: 0 %
HCT: 24.3 % — ABNORMAL LOW (ref 36.0–46.0)
Hemoglobin: 7.1 g/dL — ABNORMAL LOW (ref 12.0–15.0)
Immature Granulocytes: 7 %
Lymphocytes Relative: 13 %
Lymphs Abs: 4.4 10*3/uL — ABNORMAL HIGH (ref 0.7–4.0)
MCH: 24.9 pg — ABNORMAL LOW (ref 26.0–34.0)
MCHC: 29.2 g/dL — ABNORMAL LOW (ref 30.0–36.0)
MCV: 85.3 fL (ref 80.0–100.0)
Monocytes Absolute: 2 10*3/uL — ABNORMAL HIGH (ref 0.1–1.0)
Monocytes Relative: 6 %
Neutro Abs: 24.1 10*3/uL — ABNORMAL HIGH (ref 1.7–7.7)
Neutrophils Relative %: 74 %
Platelets: 245 10*3/uL (ref 150–400)
RBC: 2.85 MIL/uL — ABNORMAL LOW (ref 3.87–5.11)
RDW: 15.7 % — ABNORMAL HIGH (ref 11.5–15.5)
WBC: 33 10*3/uL — ABNORMAL HIGH (ref 4.0–10.5)
nRBC: 20.3 % — ABNORMAL HIGH (ref 0.0–0.2)

## 2019-11-18 LAB — BASIC METABOLIC PANEL
Anion gap: 15 (ref 5–15)
BUN: 207 mg/dL — ABNORMAL HIGH (ref 8–23)
CO2: 28 mmol/L (ref 22–32)
Calcium: 7.2 mg/dL — ABNORMAL LOW (ref 8.9–10.3)
Chloride: 110 mmol/L (ref 98–111)
Creatinine, Ser: 2.03 mg/dL — ABNORMAL HIGH (ref 0.44–1.00)
GFR calc Af Amer: 26 mL/min — ABNORMAL LOW (ref >60)
GFR calc non Af Amer: 23 mL/min — ABNORMAL LOW (ref >60)
Glucose, Bld: 147 mg/dL — ABNORMAL HIGH (ref 70–99)
Potassium: 5.2 mmol/L — ABNORMAL HIGH (ref 3.5–5.1)
Sodium: 153 mmol/L — ABNORMAL HIGH (ref 135–145)

## 2019-11-18 LAB — GLUCOSE, CAPILLARY
Glucose-Capillary: 147 mg/dL — ABNORMAL HIGH (ref 70–99)
Glucose-Capillary: 143 mg/dL — ABNORMAL HIGH (ref 70–99)
Glucose-Capillary: 124 mg/dL — ABNORMAL HIGH (ref 70–99)
Glucose-Capillary: 109 mg/dL — ABNORMAL HIGH (ref 70–99)
Glucose-Capillary: 140 mg/dL — ABNORMAL HIGH (ref 70–99)
Glucose-Capillary: 173 mg/dL — ABNORMAL HIGH (ref 70–99)
Glucose-Capillary: 186 mg/dL — ABNORMAL HIGH (ref 70–99)
Glucose-Capillary: 145 mg/dL — ABNORMAL HIGH (ref 70–99)
Glucose-Capillary: 142 mg/dL — ABNORMAL HIGH (ref 70–99)
Glucose-Capillary: 153 mg/dL — ABNORMAL HIGH (ref 70–99)

## 2019-11-18 LAB — HEMOGLOBIN AND HEMATOCRIT, BLOOD
HCT: 25.9 % — ABNORMAL LOW (ref 36.0–46.0)
Hemoglobin: 7.5 g/dL — ABNORMAL LOW (ref 12.0–15.0)

## 2019-11-18 MED ORDER — FREE WATER
300.0000 mL | Status: DC
  Administered 2019-11-18: 300 mL

## 2019-11-24 NOTE — Progress Notes (Signed)
Pt with large BM. During clean up, sats dropped to the 70s, hypotensive to the 60s, double checked with 2nd cuff on leg. Placed on 100% Fi02 and Levophed started. MAPs currently >60, SpO2 > 88% on 80% FiO2.

## 2019-11-24 NOTE — Progress Notes (Signed)
NAME:  Stephanie Hendricks, MRN:  [id number], DOB:  09/10/1941, LOS: 29 ADMISSION DATE:  11/08/2019, CONSULTATION DATE:  11/14 REFERRING MD:  Triad, CHIEF COMPLAINT:  Dyspnea   Brief History   78 y/o female admitted on 11/12 with SARS COV 2 pneumonia, intubated 11/14.     Past Medical History  Post op A fib, CAD, HLD, HTN, DM, CKD 3, mild aortic stenosis, diastolic CHF  Significant Hospital Events   11/12 Admit 11/14 Transfer to [facility] 11/20 Proned   Consults:    Procedures:  ETT 11/14>   Significant Diagnostic Tests:    Micro Data:  SARS CoV2 11/12 >> positive Blood 11/12 >>Neg Final  Trachea 11/19.normal respiratory flora  Antimicrobials/COVID Rx  Remdesivir 11/13 >> 11/17 Solumedrol 11/14 >>11/24  Interim history/subjective:  Hypotensive overnight, levophed initiated.  Large BM.  Hgb 8.8-->7.1 Increased FiO2 requirements   Objective   Blood pressure (!) 133/43, pulse 66, temperature 97.7 F (36.5 C), temperature source Oral, resp. rate (!) 38, height 5\' 2"  (1.575 m), weight 85.6 kg, SpO2 92 %.    Vent Mode: PRVC FiO2 (%):  [40 %-80 %] 80 % Set Rate:  [35 bmp] 35 bmp Vt Set:  [300 mL] 300 mL PEEP:  [14 cmH20] 14 cmH20 Plateau Pressure:  [21 cmH20-32 cmH20] 32 cmH20   Intake/Output Summary (Last 24 hours) at Dec 13, 2019 0736 Last data filed at 12-13-19 0700 Gross per 24 hour  Intake 4808.03 ml  Output 1970 ml  Net 2838.03 ml   Filed Weights   11/12/19 0500 11/14/19 0500 11/15/19 0500  Weight: 84.2 kg 86.9 kg 85.6 kg    Examination:  General: Elderly female, acutely and chronically ill-appearing. NAD HENT: Normocephalic, PERRL.  Neck: No JVD. Trachea midline.  CV: IRRR. S1S2. 1/6 M, no RG. +2 distal pulses Lungs: BBS diminished at bases, otherwise clear.  FNL, symmetrical.  Vent supported ABD: +BS x4. SNT/ND. No masses, guarding or rigidity GU: Foley in place EXT: Flaccid.  Trace edema Skin: PWD. In tact. No rashes or lesions Neuro: Does not  withdraw      11/25 CXR personally reviewed worsening b/l infiltrates  Resolved Hospital Problem list     Assessment & Plan:  ARDS COVID 19 pneumonia: She continues to make little progress Continue mechanical ventilation per ARDS protocol Target TVol 6-8cc/kgIBW Target Plateau Pressure < 30cm H20 Target driving pressure less than 15 cm of water Target PaO2 55-65: titrate PEEP/FiO2 per protocol As long as PaO2 to FiO2 ratio is less than 1:150 position in prone position for 16 hours a day Ventilator associated pneumonia prevention protocol Continue solumedrol  Hypotension-multifactorial. Suspect pt will continue to decompensate.  On pressors.  Will not escalate care as same will not change outcome  Need for sedation: decreased sedation since 11/24 Encephalopathy-may be c/b uremia  Wean sedation for RASS -1 as able   ARF on CKDIII with uremia-worse today Continue to monitor.  No diuresis today. Continue free water. No an HD candidate-will not change outcome  Anemia. No obvious bleeding. BM was brown.  Repeat H&H  Monitor Hold heparin   CAD, HTN, HLD, diastolic CHF No diuresis today   Hyperglycemia Continue insulin infusion  Nutrition Tube feeding recommendations  Bleeding from injection sites.  No further noted. See above  Monitor Hgb SCD   Best practice:  Diet: tube feeding Pain/Anxiety/Delirium protocol (if indicated): as above VAP protocol (if indicated): yes DVT prophylaxis: scd/heparin GI prophylaxis: famotidine Glucose control: Insulin infusion Mobility: bed rest Code Status: DNR  Family Communication: will update Disposition: remain in ICU  Labs reviewed in EMR      The patient is critically ill with ARDS/Respiratory failure and hypotension. She requires ICU for high complexity decision making, titration of high alert medications, ventilator management, titration of oxygen and interpretation of advanced monitoring.    I personally spent 45  minutes providing critical care services including personally reviewing test results, discussing care with nursing staff/other physicians and completing orders pertaining to this patient.  Time was exclusive to the patient and does not include time spent teaching or in procedures.  Voice recognition software was used in the production of this record.  Errors in interpretation may have been inadvertently missed during review.  Francine Graven, MSN, AGACNP  [facility]

## 2019-11-24 NOTE — Progress Notes (Signed)
eLink Physician-Brief Progress Note Patient Name: Stephanie Hendricks DOB: 1941-03-19 MRN: [id number]   Date of Service  2019/12/16  HPI/Events of Note  Hypotension  eICU Interventions  Pt's Norepinephrine infusion resumed        Frederik Pear 12/16/19, 5:24 AM

## 2019-11-24 NOTE — Progress Notes (Signed)
Updated daughter in law Tye [place] and sister Eliseo Squires by phone that patient has died.

## 2019-11-24 NOTE — Progress Notes (Signed)
Wasted 50cc of dilaudid gtt with Eyvonne Left, RN in the [place].

## 2019-11-24 NOTE — Progress Notes (Signed)
Attempted to contact Cathy DIL on the number's provided in the chart but unable to get an answer on either numbers after several attempts this afternoon.

## 2019-11-24 NOTE — Progress Notes (Signed)
Pt went into SVT to the 160s, RR 50s, sats 80%. Rhythm spontaneously converted, dilaudid bolus given for RR with no effect, so versed push given. VS returned to baseline. Elink notified of event.

## 2019-11-24 DEATH — deceased

## 2019-11-25 ENCOUNTER — Ambulatory Visit: Payer: Medicare Other | Admitting: Cardiology

## 2019-12-25 NOTE — Death Summary Note (Signed)
DEATH SUMMARY   Patient Details  Name: Stephanie Hendricks MRN: [id number] DOB: [birth year]  Admission/Discharge Information   Admit Date:  11/24/19  Date of Death: Date of Death: 2019/12/07  Time of Death: Time of Death: 1250  Length of Stay: 03-25-23  Referring Physician: Celene Squibb, MD   Reason(s) for Hospitalization  dyspnea  Diagnoses  Preliminary cause of death:   COVID 81 Secondary Diagnoses (including complications and co-morbidities):  Active Problems:   Essential hypertension   Viral pneumonia   Pneumonia due to COVID-19 virus   Acute hypoxemic respiratory failure (HCC)   Acute respiratory distress syndrome (ARDS) due to COVID-19 virus (HCC)   Pressure injury of skin   Brief Hospital Course (including significant findings, care, treatment, and services provided and events leading to death)  Stephanie Hendricks is a 53 y.o. year old female who was admitted to [facility] on 24-Nov-2023 for severe acute respiratory failure with hypoxemia due to COVID 19 pneumonia. She was intubated on 11/14.  She was treated with standard steroid, remdesivir treatment per protocol.  She did not make significant progress on the ventilator.  Multiple discussions were had with her family.  Her code status was made DNR.  On Dec 07, 2023 she had a rhythm change and sudden hypotension and passed in the ICU.    Pertinent Labs and Studies  Significant Diagnostic Studies Dg Abd 1 View  Result Date: 11/12/2019 CLINICAL DATA:  Feeding tube placement. EXAM: ABDOMEN - 1 VIEW COMPARISON:  11/08/2019 FINDINGS: The NG tube tip is in the body of the stomach. The feeding tube tip extends into the distal stomach and loops back into the antrum. Bowel gas pattern is normal.  No acute bone abnormality. IMPRESSION: Feeding tube tip in the distal stomach. NG tube tip in the body of the stomach. Electronically Signed   By: Lorriane Shire M.D.   On: 11/12/2019 10:35   Dg Abd 1 View  Result Date: 11/08/2019 CLINICAL DATA:  NG tube  placement. EXAM: ABDOMEN - 1 VIEW COMPARISON:  None. FINDINGS: The NG tube is in good position with its tip in the body/antral region of the stomach. Bibasilar infiltrates again noted. IMPRESSION: NG tube in good position. Electronically Signed   By: Marijo Sanes M.D.   On: 11/08/2019 15:38   Dg Chest Port 1 View  Result Date: 12/07/2019 CLINICAL DATA:  Acute respiratory distress syndrome due to COVID 19 infection, history coronary artery disease, stage III chronic kidney disease, hypertension, type II diabetes mellitus EXAM: PORTABLE CHEST 1 VIEW COMPARISON:  Portable exam is are 556 hours compared to 11/16/2019 FINDINGS: Tip of endotracheal tube projects 3.0 cm above carina. Nasogastric tube extends into abdomen. Numerous EKG leads project over chest. Upper normal size of cardiac silhouette post CABG. Mediastinal contours and pulmonary vascularity normal. Atherosclerotic calcification aorta. Infiltrate in the mid to lower lobe RIGHT lung consistent with pneumonia, increased. Persistent consolidation and minimal atelectasis in LEFT lower lobe. Upper lungs clear. No pleural effusion or pneumothorax. IMPRESSION: Bibasilar pulmonary infiltrates consistent with pneumonia, slightly increased on RIGHT. Electronically Signed   By: Lavonia Dana M.D.   On: Dec 07, 2019 08:06   Dg Chest Port 1 View  Result Date: 11/16/2019 CLINICAL DATA:  Respiratory failure EXAM: PORTABLE CHEST 1 VIEW COMPARISON:  November 12, 2019 FINDINGS: Endotracheal tube tip is 4.2 cm above the carina. Nasogastric tube tip and side port are in the stomach. No pneumothorax. There is patchy airspace consolidation in both lower lobes, slightly increased on the left  and similar on the right. Heart is upper normal in size with pulmonary vascularity normal. No adenopathy. Patient is status post coronary artery bypass grafting. There is aortic atherosclerosis. No bone lesions. IMPRESSION: Tube positions as described without pneumothorax. Patchy  bibasilar infiltrate consistent with pneumonia. Increased infiltrate left base and similar appearance on the right. Stable cardiac silhouette. Aortic Atherosclerosis (ICD10-I70.0). Electronically Signed   By: Lowella Grip III M.D.   On: 11/16/2019 07:59   Dg Chest Port 1 View  Result Date: 11/12/2019 CLINICAL DATA:  79 year old female with a history of intubation EXAM: PORTABLE CHEST 1 VIEW COMPARISON:  11/11/2019, 11/09/2019 FINDINGS: Cardiomediastinal silhouette unchanged in size and contour. Surgical changes of median sternotomy and CABG. Endotracheal tube terminates 4 cm above the carina. Weighted tip enteric feeding tube traverses the mediastinum and terminates in the pyloric region. Interval removal of gastric tube. Low lung volumes with mixed reticulonodular opacities of the lungs, slightly improved aeration from the prior. No pneumothorax. No large pleural effusion. IMPRESSION: Endotracheal tube terminates 4 cm above the carina. Weighted tip enteric feeding tube terminates near the pylorus. Interval removal of the gastric tube. Similar appearance of low lung volumes and mixed interstitial/airspace opacities of the lungs. Electronically Signed   By: Corrie Mckusick D.O.   On: 11/12/2019 08:53   Dg Chest Port 1 View  Result Date: 11/11/2019 CLINICAL DATA:  Pneumonia due to COVID-19 EXAM: PORTABLE CHEST 1 VIEW COMPARISON:  Portable exam 0643 hours compared to 11/09/2019 FINDINGS: Tip of endotracheal tube projects 1.1 cm above carina. Nasogastric tube extends into stomach. Normal heart size post CABG. Atherosclerotic calcification aorta. Patchy BILATERAL pulmonary infiltrates greater on RIGHT, little changed. No pleural effusion or pneumothorax. Generally low lung volumes noted. Bones demineralized. IMPRESSION: Persistent pulmonary infiltrates consistent with pneumonia. Electronically Signed   By: Lavonia Dana M.D.   On: 11/11/2019 08:54   Dg Chest Port 1 View  Result Date: 11/09/2019 CLINICAL  DATA:  COVID (+), ETT position EXAM: PORTABLE CHEST 1 VIEW COMPARISON:  Chest radiograph 11/08/2019 FINDINGS: Evaluation limited due to significant patient rotation. The ET tube terminates 1.5 cm above the level of the carina. An enteric tube passes below the level of left hemidiaphragm and is cut from the field of view. Sequela of prior median sternotomy. Cardiomediastinal silhouette unchanged. Aortic atherosclerosis. Persistent bilateral patchy and ill-defined airspace opacities, similar to prior examination. No evidence of pneumothorax. No sizable pleural effusion. IMPRESSION: Lines and tubes as detailed. Bilateral patchy and ill-defined airspace opacities are similar to prior examination. Electronically Signed   By: Kellie Simmering DO   On: 11/09/2019 07:30   Dg Chest Port 1 View  Result Date: 11/08/2019 CLINICAL DATA:  Endotracheal tube placement. EXAM: PORTABLE CHEST 1 VIEW COMPARISON:  Earlier film, same date. FINDINGS: The endotracheal tube is 19 mm above the carina. The NG tube is coursing down the esophagus and into the stomach. Stable surgical changes from bypass surgery. The heart is normal in size. Stable tortuosity and calcification of the thoracic aorta. Persistent patchy bilateral infiltrates but improved aeration status post intubation. IMPRESSION: 1. Endotracheal tube and NG tubes in good position without complicating features. 2. Persistent patchy bilateral infiltrates but improved aeration status post intubation. Electronically Signed   By: Marijo Sanes M.D.   On: 11/08/2019 15:38   Dg Chest Port 1 View  Result Date: 11/07/2019 CLINICAL DATA:  Dyspnea, COVID-19 pneumonia EXAM: PORTABLE CHEST 1 VIEW COMPARISON:  11/09/2019 chest radiograph. FINDINGS: Intact sternotomy wires. Stable cardiomediastinal silhouette with mild  cardiomegaly. No pneumothorax. No pleural effusion. Extensive patchy opacity throughout both lungs, worsened. Low lung volumes. IMPRESSION: Worsened extensive patchy  opacity throughout both lungs, which could represent pneumonia and/or pulmonary edema given cardiomegaly. Low lung volumes. Electronically Signed   By: Ilona Sorrel M.D.   On: 11/07/2019 13:43   Dg Chest Port 1 View  Result Date: 11/02/2019 CLINICAL DATA:  Shortness of breath and fever for several days, history of COVID-19 exposure EXAM: PORTABLE CHEST 1 VIEW COMPARISON:  01/30/2018 FINDINGS: Cardiac shadow is mildly prominent but stable. Postsurgical changes are again seen. The lungs are well aerated bilaterally with patchy bibasilar airspace opacities. Some patchy opacities are noted in the left upper lobe as well. IMPRESSION: Patchy bilateral airspace opacities. Given the recent exposure COVID-19 is a distinct possibility. Correlate with laboratory testing. Electronically Signed   By: Inez Catalina M.D.   On: 11/04/2019 19:38   [place] Ekg Site Rite  Result Date: 11/14/2019 If Site Rite image not attached, placement could not be confirmed due to current cardiac rhythm.   Microbiology No results found for this or any previous visit (from the past 240 hour(s)).  Lab Basic Metabolic Panel: No results for input(s): NA, K, CL, CO2, GLUCOSE, BUN, CREATININE, CALCIUM, MG, PHOS in the last 168 hours. Liver Function Tests: No results for input(s): AST, ALT, ALKPHOS, BILITOT, PROT, ALBUMIN in the last 168 hours. No results for input(s): LIPASE, AMYLASE in the last 168 hours. No results for input(s): AMMONIA in the last 168 hours. CBC: No results for input(s): WBC, NEUTROABS, HGB, HCT, MCV, PLT in the last 168 hours. Cardiac Enzymes: No results for input(s): CKTOTAL, CKMB, CKMBINDEX, TROPONINI in the last 168 hours. Sepsis Labs: No results for input(s): PROCALCITON, WBC, LATICACIDVEN in the last 168 hours.  Procedures/Operations  Intubation/mechanical ventilation   Simonne Maffucci 11/27/2019, 7:13 AM
